# Patient Record
Sex: Male | Born: 1975 | Race: White | Hispanic: No | Smoking: Current every day smoker
Health system: Southern US, Community
[De-identification: ages and names within clinical notes are randomized; demographics above are authoritative.]

## PROBLEM LIST (undated history)

## (undated) ENCOUNTER — Emergency Department (HOSPITAL_COMMUNITY): Admission: EM | Payer: Worker's Compensation | Source: Home / Self Care

## (undated) DIAGNOSIS — D369 Benign neoplasm, unspecified site: Secondary | ICD-10-CM

## (undated) DIAGNOSIS — F319 Bipolar disorder, unspecified: Secondary | ICD-10-CM

## (undated) DIAGNOSIS — F1020 Alcohol dependence, uncomplicated: Secondary | ICD-10-CM

## (undated) DIAGNOSIS — F32A Depression, unspecified: Secondary | ICD-10-CM

## (undated) DIAGNOSIS — F329 Major depressive disorder, single episode, unspecified: Secondary | ICD-10-CM

## (undated) DIAGNOSIS — M549 Dorsalgia, unspecified: Secondary | ICD-10-CM

## (undated) HISTORY — PX: OTHER SURGICAL HISTORY: SHX169

---
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
## 2009-07-19 ENCOUNTER — Emergency Department (HOSPITAL_BASED_OUTPATIENT_CLINIC_OR_DEPARTMENT_OTHER): Admission: EM | Admit: 2009-07-19 | Discharge: 2009-07-19 | Payer: Self-pay | Admitting: Emergency Medicine

## 2009-11-19 ENCOUNTER — Emergency Department (HOSPITAL_BASED_OUTPATIENT_CLINIC_OR_DEPARTMENT_OTHER): Admission: EM | Admit: 2009-11-19 | Discharge: 2009-11-19 | Payer: Self-pay | Admitting: Emergency Medicine

## 2009-12-21 ENCOUNTER — Ambulatory Visit: Payer: Self-pay | Admitting: Diagnostic Radiology

## 2009-12-21 ENCOUNTER — Ambulatory Visit: Payer: Self-pay | Admitting: Internal Medicine

## 2009-12-21 ENCOUNTER — Ambulatory Visit (HOSPITAL_BASED_OUTPATIENT_CLINIC_OR_DEPARTMENT_OTHER): Admission: RE | Admit: 2009-12-21 | Discharge: 2009-12-21 | Payer: Self-pay | Admitting: Internal Medicine

## 2009-12-21 DIAGNOSIS — K219 Gastro-esophageal reflux disease without esophagitis: Secondary | ICD-10-CM | POA: Insufficient documentation

## 2009-12-21 DIAGNOSIS — M545 Low back pain, unspecified: Secondary | ICD-10-CM | POA: Insufficient documentation

## 2009-12-21 DIAGNOSIS — Z9189 Other specified personal risk factors, not elsewhere classified: Secondary | ICD-10-CM | POA: Insufficient documentation

## 2009-12-21 DIAGNOSIS — F39 Unspecified mood [affective] disorder: Secondary | ICD-10-CM | POA: Insufficient documentation

## 2009-12-21 LAB — CONVERTED CEMR LAB
ALT: 26 units/L (ref 0–53)
AST: 20 units/L (ref 0–37)
Albumin: 4.4 g/dL (ref 3.5–5.2)
Alkaline Phosphatase: 49 units/L (ref 39–117)
BUN: 8 mg/dL (ref 6–23)
Basophils Absolute: 0 10*3/uL (ref 0.0–0.1)
Basophils Relative: 0 % (ref 0–1)
Bilirubin, Direct: 0.1 mg/dL (ref 0.0–0.3)
CO2: 25 meq/L (ref 19–32)
Calcium: 9.9 mg/dL (ref 8.4–10.5)
Chloride: 103 meq/L (ref 96–112)
Creatinine, Ser: 0.79 mg/dL (ref 0.40–1.50)
Eosinophils Absolute: 0.3 10*3/uL (ref 0.0–0.7)
Eosinophils Relative: 4 % (ref 0–5)
Free T4: 1.14 ng/dL (ref 0.80–1.80)
Glucose, Bld: 92 mg/dL (ref 70–99)
HCT: 46 % (ref 39.0–52.0)
HCV Ab: NEGATIVE
Hemoglobin: 15.3 g/dL (ref 13.0–17.0)
Hep B S Ab: NEGATIVE
Indirect Bilirubin: 0.5 mg/dL (ref 0.0–0.9)
Lymphocytes Relative: 27 % (ref 12–46)
Lymphs Abs: 1.8 10*3/uL (ref 0.7–4.0)
MCHC: 33.3 g/dL (ref 30.0–36.0)
MCV: 91.5 fL (ref 78.0–100.0)
Monocytes Absolute: 0.6 10*3/uL (ref 0.1–1.0)
Monocytes Relative: 9 % (ref 3–12)
Neutro Abs: 4.1 10*3/uL (ref 1.7–7.7)
Neutrophils Relative %: 60 % (ref 43–77)
Platelets: 248 10*3/uL (ref 150–400)
Potassium: 4.3 meq/L (ref 3.5–5.3)
RBC: 5.03 M/uL (ref 4.22–5.81)
RDW: 12.7 % (ref 11.5–15.5)
Sodium: 140 meq/L (ref 135–145)
TSH: 0.773 microintl units/mL (ref 0.350–4.500)
Total Bilirubin: 0.6 mg/dL (ref 0.3–1.2)
Total Protein: 7.4 g/dL (ref 6.0–8.3)
WBC: 6.8 10*3/uL (ref 4.0–10.5)

## 2009-12-27 ENCOUNTER — Telehealth: Payer: Self-pay | Admitting: Internal Medicine

## 2010-01-18 ENCOUNTER — Ambulatory Visit: Payer: Self-pay | Admitting: Internal Medicine

## 2010-01-18 DIAGNOSIS — B353 Tinea pedis: Secondary | ICD-10-CM | POA: Insufficient documentation

## 2010-05-08 ENCOUNTER — Emergency Department (HOSPITAL_BASED_OUTPATIENT_CLINIC_OR_DEPARTMENT_OTHER): Admission: EM | Admit: 2010-05-08 | Discharge: 2010-05-08 | Payer: Self-pay | Admitting: Emergency Medicine

## 2010-05-08 ENCOUNTER — Ambulatory Visit: Payer: Self-pay | Admitting: Diagnostic Radiology

## 2010-07-15 ENCOUNTER — Ambulatory Visit: Payer: Self-pay | Admitting: Diagnostic Radiology

## 2010-07-15 ENCOUNTER — Emergency Department (HOSPITAL_BASED_OUTPATIENT_CLINIC_OR_DEPARTMENT_OTHER): Admission: EM | Admit: 2010-07-15 | Discharge: 2010-07-15 | Payer: Self-pay | Admitting: Emergency Medicine
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
## 2010-12-12 NOTE — Assessment & Plan Note (Signed)
Summary: 1 MONTH FOLLOW UP/MHF   Vital Signs:  Patient profile:   35 year old male Weight:      159 pounds BMI:     23.57 O2 Sat:      97 % on Room air Temp:     97.9 degrees F oral Pulse rate:   88 / minute Pulse rhythm:   regular Resp:     16 per minute BP sitting:   110 / 58  (right arm) Cuff size:   regular  Vitals Entered By: Glendell Docker CMA (January 18, 2010 8:29 AM)  O2 Flow:  Room air CC: Rm 3- 1 Month Follow up   Primary Care Provider:  Dondra Spry DO  CC:  Rm 3- 1 Month Follow up.  History of Present Illness: 35 y/o white male for f/u he is waiting for psych eval he describes recent mild manic episode  back pain - stable.  no exacerabation  c/o dry patch on lower leg  Allergies: 1)  ! 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   Lungs:  normal respiratory effort and normal breath sounds.   Heart:  normal rate, regular rhythm, and no gallop.   Skin:  small scaly patch right lower leg   Impression & Recommendations:  Problem # 1:  TINEA PEDIS (ICD-110.4)  Take medication as directed for full duration.   Problem # 2:  MOOD DISORDER (ICD-296.90) Assessment: Unchanged Awaiting psych eval.  Patient Instructions: 1)  Please schedule a follow-up appointment in 3 months. Prescriptions: CLOTRIMAZOLE-BETAMETHASONE 1-0.05 % CREA (CLOTRIMAZOLE-BETAMETHASONE) apply two times a day x 10 days  #30 grams x 1   Entered and Authorized by:   D. Thomos Lemons DO   Signed by:   D. Thomos Lemons DO on 01/18/2010   Method used:   Electronically to        [facility] [address] (retail)       [address] [place], [place]  [address]       Ph: [phone] or [phone]       Fax: [phone]   RxID:   [id number]   Current Allergies (reviewed today): ! CODEINE

## 2010-12-12 NOTE — Progress Notes (Signed)
Summary: Pt. want lab results and Xray results  Phone Note Call from Patient   Caller: Patient Call For: D. Thomos Lemons DO Summary of Call: Pt. would like to know his blood work results and his Xray results. Call patient back at [phone] Initial call taken by: Michaelle Copas,  December 27, 2009 9:41 AM  Follow-up for Phone Call        x ray of lumbar spine - normal blood tests are normal.  we will mail copy to pt Follow-up by: D. Thomos Lemons DO,  December 27, 2009 1:27 PM  Additional Follow-up for Phone Call Additional follow up Details #1::        called nad informed pt. that xray and blood work is normal Additional Follow-up by: Michaelle Copas,  December 27, 2009 1:41 PM

## 2011-09-12 ENCOUNTER — Encounter: Payer: Self-pay | Admitting: *Deleted

## 2011-09-12 ENCOUNTER — Emergency Department (HOSPITAL_BASED_OUTPATIENT_CLINIC_OR_DEPARTMENT_OTHER)
Admission: EM | Admit: 2011-09-12 | Discharge: 2011-09-12 | Disposition: A | Discharge status: Home / Self Care | Payer: Worker's Compensation | Attending: Emergency Medicine | Admitting: Emergency Medicine

## 2011-09-12 DIAGNOSIS — M549 Dorsalgia, unspecified: Secondary | ICD-10-CM | POA: Insufficient documentation

## 2011-09-12 DIAGNOSIS — Y9289 Other specified places as the place of occurrence of the external cause: Secondary | ICD-10-CM | POA: Insufficient documentation

## 2011-09-12 DIAGNOSIS — IMO0002 Reserved for concepts with insufficient information to code with codable children: Secondary | ICD-10-CM | POA: Insufficient documentation

## 2011-09-12 DIAGNOSIS — S46919A Strain of unspecified muscle, fascia and tendon at shoulder and upper arm level, unspecified arm, initial encounter: Secondary | ICD-10-CM

## 2011-09-12 DIAGNOSIS — X500XXA Overexertion from strenuous movement or load, initial encounter: Secondary | ICD-10-CM | POA: Insufficient documentation

## 2011-09-12 HISTORY — DX: Dorsalgia, unspecified: M54.9

## 2011-09-12 MED ORDER — CYCLOBENZAPRINE HCL 10 MG PO TABS: 10.0000 mg | ORAL_TABLET | Freq: Three times a day (TID) | ORAL | Status: AC | PRN

## 2011-09-12 MED ORDER — HYDROCODONE-ACETAMINOPHEN 5-325 MG PO TABS: 1.0000 | ORAL_TABLET | ORAL | Status: AC | PRN

## 2011-09-12 MED ORDER — PREDNISONE 10 MG PO TABS: ORAL_TABLET | ORAL | Status: DC

## 2011-09-12 NOTE — ED Provider Notes (Signed)
History     CSN: [id number] Arrival date & time: 09/12/2011  7:16 AM   None     Chief Complaint  Patient presents with  . Back Pain    (Consider location/radiation/quality/duration/timing/severity/associated sxs/prior treatment) HPI Comments: The patient had noted pain in his left upper back and left scapular region while at work yesterday. He lifts heavy boxes at work.  He had started work today and the first box he lifted cause considerable pain in the left shoulder area. He therefore sought evaluation. He is also noted some weird feelings in the left leg. He has a prior history of back problems. He's had no prior back surgery.  Patient is a 35 y.o. male presenting with back pain.  Back Pain  This is a new problem. The current episode started yesterday. Episode frequency: He developed the pain yesterday while at work, lifting boxes, and it recurred this morning when he lifted a box. Progression since onset: His pain is recurrent. The pain is associated with lifting heavy objects. Pain location: The pain is located in the upper back on the left side in the region of the left scapula. The quality of the pain is described as aching. The pain does not radiate. The pain is at a severity of 6/10. The pain is moderate. Exacerbated by: Is worsened by lifting heavy objects. He has tried nothing for the symptoms.    Past Medical History  Diagnosis Date  . Back pain     History reviewed. No pertinent past surgical history.  History reviewed. No pertinent family history.  History  Substance Use Topics  . Smoking status: Current Everyday Smoker -- 0.5 packs/day  . Smokeless tobacco: Not on file  . Alcohol Use: Yes     occ      Review of Systems  Musculoskeletal: Positive for back pain.  All other systems reviewed and are negative.    Allergies  Codeine  Home Medications   Current Outpatient Rx  Name Route Sig Dispense Refill  . AMPHETAMINE-DEXTROAMPHETAMINE 30 MG PO TABS  Oral Take 30 mg by mouth 2 (two) times daily.      . CYCLOBENZAPRINE HCL 10 MG PO TABS Oral Take 1 tablet (10 mg total) by mouth 3 (three) times daily as needed for muscle spasms. 15 tablet 0  . HYDROCODONE-ACETAMINOPHEN 5-325 MG PO TABS Oral Take 1 tablet by mouth every 4 (four) hours as needed for pain. 20 tablet 0  . PREDNISONE 10 MG PO TABS  Take 3 tablets per day for two days, then 2 tablets per day for 2 days, then 1 tablet per day for 2 days. 12 tablet 0    BP 134/83  Pulse 97  Temp(Src) 98.3 F (36.8 C) (Oral)  Resp 20  SpO2 100%  Physical Exam  Constitutional: He is oriented to person, place, and time. He appears well-developed and well-nourished. No distress.  HENT:  Head: Atraumatic.  Right Ear: External ear normal.  Left Ear: External ear normal.  Eyes: Conjunctivae and EOM are normal. Pupils are equal, round, and reactive to light.  Neck: Normal range of motion. Neck supple.  Cardiovascular: Normal rate, regular rhythm and normal heart sounds.   Pulmonary/Chest: Effort normal and breath sounds normal.  Abdominal: There is no tenderness.  Musculoskeletal:       He localizes pain to the left upper back in the region of the left scapula. There is no palpable bony deformity or tenderness. He has a 2 cm lipoma overlying the region  of L1. This is nontender to touch.  Lymphadenopathy:    He has no cervical adenopathy.  Neurological: He is alert and oriented to person, place, and time. He has normal reflexes.       No sensory or motor deficits  Skin: Skin is warm and dry.  Psychiatric: He has a normal mood and affect. His behavior is normal.    ED Course  Procedures (including critical care time)     1. Muscle strain, shoulder region             Carleene Cooper III, MD 09/12/11 [phone]

## 2011-09-12 NOTE — ED Notes (Signed)
Left shoulder down left leg pain states " i hunched over and straightened back out and it locked" pt reports history of back shoulder and neck pain

## 2013-01-30 ENCOUNTER — Emergency Department (HOSPITAL_BASED_OUTPATIENT_CLINIC_OR_DEPARTMENT_OTHER)
Admission: EM | Admit: 2013-01-30 | Discharge: 2013-01-30 | Disposition: A | Discharge status: Home / Self Care | Payer: Self-pay | Attending: Emergency Medicine | Admitting: Emergency Medicine

## 2013-01-30 ENCOUNTER — Encounter (HOSPITAL_BASED_OUTPATIENT_CLINIC_OR_DEPARTMENT_OTHER): Payer: Self-pay | Admitting: *Deleted

## 2013-01-30 DIAGNOSIS — F17200 Nicotine dependence, unspecified, uncomplicated: Secondary | ICD-10-CM | POA: Insufficient documentation

## 2013-01-30 DIAGNOSIS — Z79899 Other long term (current) drug therapy: Secondary | ICD-10-CM | POA: Insufficient documentation

## 2013-01-30 DIAGNOSIS — R197 Diarrhea, unspecified: Secondary | ICD-10-CM | POA: Insufficient documentation

## 2013-01-30 DIAGNOSIS — K5289 Other specified noninfective gastroenteritis and colitis: Secondary | ICD-10-CM | POA: Insufficient documentation

## 2013-01-30 DIAGNOSIS — R509 Fever, unspecified: Secondary | ICD-10-CM | POA: Insufficient documentation

## 2013-01-30 DIAGNOSIS — K529 Noninfective gastroenteritis and colitis, unspecified: Secondary | ICD-10-CM

## 2013-01-30 MED ORDER — ONDANSETRON 4 MG PO TBDP: 4.0000 mg | ORAL_TABLET | Freq: Four times a day (QID) | ORAL | Status: DC | PRN

## 2013-01-30 MED ORDER — ONDANSETRON 4 MG PO TBDP
4.0000 mg | ORAL_TABLET | Freq: Once | ORAL | Status: AC
  Administered 2013-01-30: 4 mg via ORAL
  Filled 2013-01-30: qty 1

## 2013-01-30 NOTE — ED Notes (Signed)
Pt sts he awoke this afternoon with vomiting and fever of 103. Pt sts he took tylenol at 3:00pm.

## 2013-01-30 NOTE — ED Provider Notes (Signed)
History     CSN: [id number]  Arrival date & time 01/30/13  0003   First MD Initiated Contact with Patient 01/30/13 0056      Chief Complaint  Patient presents with  . Emesis    (Consider location/radiation/quality/duration/timing/severity/associated sxs/prior treatment) HPI Jonathan Lin is a 37 y.o. male comes in complaining of nausea vomiting, diarrhea and fever of 103 today. He says his stools have been loose and watery, no blood, no melena. She says his vomiting has been consistent, he is occasionally minimally keep fluids down, vomited nonbilious and nonbloody.  No abdominal pain, no chest pain, no shortness of breath.  Symptoms have been severe, no other alleviating or exacerbating factors no associated symptoms. Drinks beer socially. Smokes a half pack cigarettes daily since 37 years old.  Past Medical History  Diagnosis Date  . Back pain     History reviewed. No pertinent past surgical history.  No family history on file.  History  Substance Use Topics  . Smoking status: Current Every Day Smoker -- 0.50 packs/day  . Smokeless tobacco: Not on file  . Alcohol Use: Yes     Comment: occ      Review of Systems At least 10pt or greater review of systems completed and are negative except where specified in the HPI.  Allergies  Codeine  Home Medications   Current Outpatient Rx  Name  Route  Sig  Dispense  Refill  . amphetamine-dextroamphetamine (ADDERALL) 30 MG tablet   Oral   Take 30 mg by mouth 2 (two) times daily.           . predniSONE (DELTASONE) 10 MG tablet      Take 3 tablets per day for two days, then 2 tablets per day for 2 days, then 1 tablet per day for 2 days.   12 tablet   0     BP 120/82  Pulse 96  Temp(Src) 98.8 F (37.1 C) (Oral)  Resp 16  Ht 5\' 11"  (1.803 m)  Wt 140 lb (63.504 kg)  BMI 19.53 kg/m2  SpO2 99%  Physical Exam  Nursing notes reviewed.  Electronic medical record reviewed. VITAL SIGNS:   Filed Vitals:   01/30/13  0010  BP: 120/82  Pulse: 96  Temp: 98.8 F (37.1 C)  TempSrc: Oral  Resp: 16  Height: 5\' 11"  (1.803 m)  Weight: 140 lb (63.504 kg)  SpO2: 99%   CONSTITUTIONAL: Awake, oriented, appears non-toxic HENT: Atraumatic, normocephalic, oral mucosa pink and moist, airway patent. Nares patent without drainage. External ears normal. EYES: Conjunctiva clear, EOMI, PERRLA NECK: Trachea midline, non-tender, supple CARDIOVASCULAR: Normal heart rate, Normal rhythm, No murmurs, rubs, gallops PULMONARY/CHEST: Clear to auscultation, no rhonchi, wheezes, or rales. Symmetrical breath sounds. Non-tender. ABDOMINAL: Non-distended, soft, non-tender - no rebound or guarding.  BS normal. NEUROLOGIC: Non-focal, moving all four extremities, no gross sensory or motor deficits. EXTREMITIES: No clubbing, cyanosis, or edema SKIN: Warm, Dry, No erythema, No rash  ED Course  Procedures (including critical care time)  Labs Reviewed - No data to display No results found.   1. Acute gastroenteritis       MDM  Jonathan Lin is a 37 y.o. male assessment likely viral gastroenteritis. Patient has nonfocal abdominal exam, doubt appendicitis, doubt biliary disease, pancreatitis, or any other intra-abdominal emergency; patient is afebrile and nontoxic in the emergency department, appears well-hydrated. Do not think this patient requires further workup for an IVP. History the patient is a Zofran he feels much better. The  patient to continue his current therapy of Tylenol, Motrin MDs Zofran as needed for nausea, continue fluid hydration by mouth.        Jones Skene, MD 01/30/13 [phone]

## 2013-01-30 NOTE — ED Notes (Signed)
Vomiting since 1245 yesterday.  Vomited " a lot".  Nausea continues. Fever to 103.  Took Tylenol at 1500 with minimal relief.  No diarrhea.  No pain. Took a cold shower and felt somewhat better.  No known exposure to illness.

## 2013-08-06 ENCOUNTER — Encounter (HOSPITAL_BASED_OUTPATIENT_CLINIC_OR_DEPARTMENT_OTHER): Payer: Self-pay | Admitting: Emergency Medicine

## 2013-08-06 ENCOUNTER — Emergency Department (HOSPITAL_BASED_OUTPATIENT_CLINIC_OR_DEPARTMENT_OTHER): Payer: Worker's Compensation

## 2013-08-06 ENCOUNTER — Emergency Department (HOSPITAL_BASED_OUTPATIENT_CLINIC_OR_DEPARTMENT_OTHER)
Admission: EM | Admit: 2013-08-06 | Discharge: 2013-08-06 | Disposition: A | Discharge status: Home / Self Care | Payer: Worker's Compensation | Attending: Emergency Medicine | Admitting: Emergency Medicine

## 2013-08-06 DIAGNOSIS — T07XXXA Unspecified multiple injuries, initial encounter: Secondary | ICD-10-CM

## 2013-08-06 DIAGNOSIS — F17200 Nicotine dependence, unspecified, uncomplicated: Secondary | ICD-10-CM | POA: Insufficient documentation

## 2013-08-06 DIAGNOSIS — S20229A Contusion of unspecified back wall of thorax, initial encounter: Secondary | ICD-10-CM | POA: Insufficient documentation

## 2013-08-06 DIAGNOSIS — S0990XA Unspecified injury of head, initial encounter: Secondary | ICD-10-CM | POA: Insufficient documentation

## 2013-08-06 DIAGNOSIS — S8000XA Contusion of unspecified knee, initial encounter: Secondary | ICD-10-CM | POA: Insufficient documentation

## 2013-08-06 DIAGNOSIS — S5000XA Contusion of unspecified elbow, initial encounter: Secondary | ICD-10-CM | POA: Insufficient documentation

## 2013-08-06 MED ORDER — OXYCODONE-ACETAMINOPHEN 5-325 MG PO TABS
2.0000 | ORAL_TABLET | Freq: Once | ORAL | Status: AC
  Administered 2013-08-06: 2 via ORAL
  Filled 2013-08-06: qty 2

## 2013-08-06 MED ORDER — OXYCODONE-ACETAMINOPHEN 5-325 MG PO TABS: 2.0000 | ORAL_TABLET | Freq: Four times a day (QID) | ORAL | Status: DC | PRN

## 2013-08-06 NOTE — ED Notes (Signed)
Pt. Reports he needs a few crackers for nausea.  Crackers given to Pt.
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
## 2013-08-09 ENCOUNTER — Emergency Department (HOSPITAL_BASED_OUTPATIENT_CLINIC_OR_DEPARTMENT_OTHER): Payer: Self-pay

## 2013-08-09 ENCOUNTER — Emergency Department (HOSPITAL_BASED_OUTPATIENT_CLINIC_OR_DEPARTMENT_OTHER): Payer: Worker's Compensation

## 2013-08-09 ENCOUNTER — Emergency Department (HOSPITAL_BASED_OUTPATIENT_CLINIC_OR_DEPARTMENT_OTHER)
Admission: EM | Admit: 2013-08-09 | Discharge: 2013-08-09 | Disposition: A | Discharge status: Home / Self Care | Payer: Worker's Compensation | Attending: Emergency Medicine | Admitting: Emergency Medicine

## 2013-08-09 ENCOUNTER — Encounter (HOSPITAL_BASED_OUTPATIENT_CLINIC_OR_DEPARTMENT_OTHER): Payer: Self-pay | Admitting: *Deleted

## 2013-08-09 DIAGNOSIS — K5900 Constipation, unspecified: Secondary | ICD-10-CM

## 2013-08-09 DIAGNOSIS — T148XXA Other injury of unspecified body region, initial encounter: Secondary | ICD-10-CM

## 2013-08-09 DIAGNOSIS — S301XXA Contusion of abdominal wall, initial encounter: Secondary | ICD-10-CM | POA: Insufficient documentation

## 2013-08-09 DIAGNOSIS — S20229A Contusion of unspecified back wall of thorax, initial encounter: Secondary | ICD-10-CM | POA: Insufficient documentation

## 2013-08-09 DIAGNOSIS — F411 Generalized anxiety disorder: Secondary | ICD-10-CM | POA: Insufficient documentation

## 2013-08-09 DIAGNOSIS — F17200 Nicotine dependence, unspecified, uncomplicated: Secondary | ICD-10-CM | POA: Insufficient documentation

## 2013-08-09 LAB — CBC WITH DIFFERENTIAL/PLATELET
Basophils Absolute: 0 10*3/uL (ref 0.0–0.1)
Basophils Relative: 1 % (ref 0–1)
Eosinophils Absolute: 0.3 10*3/uL (ref 0.0–0.7)
Eosinophils Relative: 5 % (ref 0–5)
HCT: 44.3 % (ref 39.0–52.0)
Hemoglobin: 15 g/dL (ref 13.0–17.0)
Lymphocytes Relative: 31 % (ref 12–46)
Lymphs Abs: 1.9 10*3/uL (ref 0.7–4.0)
MCH: 30.5 pg (ref 26.0–34.0)
MCHC: 33.9 g/dL (ref 30.0–36.0)
MCV: 90 fL (ref 78.0–100.0)
Monocytes Absolute: 0.6 10*3/uL (ref 0.1–1.0)
Monocytes Relative: 10 % (ref 3–12)
Neutro Abs: 3.3 10*3/uL (ref 1.7–7.7)
Neutrophils Relative %: 53 % (ref 43–77)
Platelets: 172 10*3/uL (ref 150–400)
RBC: 4.92 MIL/uL (ref 4.22–5.81)
RDW: 12.6 % (ref 11.5–15.5)
WBC: 6.3 10*3/uL (ref 4.0–10.5)

## 2013-08-09 LAB — COMPREHENSIVE METABOLIC PANEL
ALT: 28 U/L (ref 0–53)
AST: 17 U/L (ref 0–37)
Albumin: 3.9 g/dL (ref 3.5–5.2)
Alkaline Phosphatase: 59 U/L (ref 39–117)
BUN: 14 mg/dL (ref 6–23)
CO2: 28 mEq/L (ref 19–32)
Calcium: 10.1 mg/dL (ref 8.4–10.5)
Chloride: 102 mEq/L (ref 96–112)
Creatinine, Ser: 0.7 mg/dL (ref 0.50–1.35)
GFR calc Af Amer: >90 mL/min (ref >90)
GFR calc non Af Amer: >90 mL/min (ref >90)
Glucose, Bld: 111 mg/dL — ABNORMAL HIGH (ref 70–99)
Potassium: 3.9 mEq/L (ref 3.5–5.1)
Sodium: 139 mEq/L (ref 135–145)
Total Bilirubin: 0.3 mg/dL (ref 0.3–1.2)
Total Protein: 7.6 g/dL (ref 6.0–8.3)

## 2013-08-09 LAB — LIPASE, BLOOD: Lipase: 32 U/L (ref 11–59)

## 2013-08-09 MED ORDER — IOHEXOL 300 MG/ML  SOLN
100.0000 mL | Freq: Once | INTRAMUSCULAR | Status: AC | PRN
  Administered 2013-08-09: 100 mL via INTRAVENOUS

## 2013-08-09 MED ORDER — DOCUSATE SODIUM 100 MG PO CAPS: 100.0000 mg | ORAL_CAPSULE | Freq: Two times a day (BID) | ORAL | Status: DC

## 2013-08-09 MED ORDER — MORPHINE SULFATE 4 MG/ML IJ SOLN
4.0000 mg | Freq: Once | INTRAMUSCULAR | Status: AC
  Administered 2013-08-09: 4 mg via INTRAVENOUS
  Filled 2013-08-09: qty 1
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
## 2013-08-09 NOTE — ED Notes (Addendum)
Constipated. Believes "due to the nature of his injury" may have caused some internal damage. States that he took miralax once without result.

## 2013-10-29 ENCOUNTER — Encounter (HOSPITAL_COMMUNITY): Payer: Self-pay | Admitting: Behavioral Health

## 2013-10-29 ENCOUNTER — Encounter (HOSPITAL_COMMUNITY): Payer: Self-pay | Admitting: Emergency Medicine

## 2013-10-29 ENCOUNTER — Inpatient Hospital Stay (HOSPITAL_COMMUNITY)
Admission: AD | Admit: 2013-10-29 | Discharge: 2013-11-03 | DRG: 897 | Disposition: A | Discharge status: Home / Self Care | Payer: Federal, State, Local not specified - Other | Source: Intra-hospital | Attending: Psychiatry | Admitting: Psychiatry

## 2013-10-29 ENCOUNTER — Emergency Department (HOSPITAL_COMMUNITY)
Admission: EM | Admit: 2013-10-29 | Discharge: 2013-10-29 | Disposition: A | Discharge status: Other Acute Inpatient Hospital | Payer: Federal, State, Local not specified - Other | Attending: Emergency Medicine | Admitting: Emergency Medicine

## 2013-10-29 DIAGNOSIS — Z5989 Other problems related to housing and economic circumstances: Secondary | ICD-10-CM

## 2013-10-29 DIAGNOSIS — F32A Depression, unspecified: Secondary | ICD-10-CM

## 2013-10-29 DIAGNOSIS — F909 Attention-deficit hyperactivity disorder, unspecified type: Secondary | ICD-10-CM | POA: Diagnosis present

## 2013-10-29 DIAGNOSIS — F17200 Nicotine dependence, unspecified, uncomplicated: Secondary | ICD-10-CM | POA: Diagnosis present

## 2013-10-29 DIAGNOSIS — F39 Unspecified mood [affective] disorder: Secondary | ICD-10-CM | POA: Diagnosis present

## 2013-10-29 DIAGNOSIS — F332 Major depressive disorder, recurrent severe without psychotic features: Secondary | ICD-10-CM | POA: Diagnosis present

## 2013-10-29 DIAGNOSIS — R45851 Suicidal ideations: Secondary | ICD-10-CM

## 2013-10-29 DIAGNOSIS — F329 Major depressive disorder, single episode, unspecified: Secondary | ICD-10-CM | POA: Diagnosis present

## 2013-10-29 DIAGNOSIS — M545 Low back pain, unspecified: Secondary | ICD-10-CM

## 2013-10-29 DIAGNOSIS — F902 Attention-deficit hyperactivity disorder, combined type: Secondary | ICD-10-CM

## 2013-10-29 DIAGNOSIS — F419 Anxiety disorder, unspecified: Secondary | ICD-10-CM

## 2013-10-29 DIAGNOSIS — F1910 Other psychoactive substance abuse, uncomplicated: Secondary | ICD-10-CM | POA: Diagnosis present

## 2013-10-29 DIAGNOSIS — Z23 Encounter for immunization: Secondary | ICD-10-CM

## 2013-10-29 DIAGNOSIS — Z87820 Personal history of traumatic brain injury: Secondary | ICD-10-CM

## 2013-10-29 DIAGNOSIS — Z5987 Material hardship due to limited financial resources, not elsewhere classified: Secondary | ICD-10-CM

## 2013-10-29 DIAGNOSIS — F410 Panic disorder [episodic paroxysmal anxiety] without agoraphobia: Secondary | ICD-10-CM | POA: Diagnosis present

## 2013-10-29 DIAGNOSIS — G4700 Insomnia, unspecified: Secondary | ICD-10-CM | POA: Diagnosis present

## 2013-10-29 DIAGNOSIS — Z598 Other problems related to housing and economic circumstances: Secondary | ICD-10-CM

## 2013-10-29 DIAGNOSIS — F411 Generalized anxiety disorder: Secondary | ICD-10-CM | POA: Diagnosis present

## 2013-10-29 DIAGNOSIS — F10939 Alcohol use, unspecified with withdrawal, unspecified: Principal | ICD-10-CM | POA: Diagnosis present

## 2013-10-29 DIAGNOSIS — K219 Gastro-esophageal reflux disease without esophagitis: Secondary | ICD-10-CM

## 2013-10-29 DIAGNOSIS — F1020 Alcohol dependence, uncomplicated: Secondary | ICD-10-CM | POA: Diagnosis present

## 2013-10-29 DIAGNOSIS — F10239 Alcohol dependence with withdrawal, unspecified: Principal | ICD-10-CM | POA: Diagnosis present

## 2013-10-29 DIAGNOSIS — F1994 Other psychoactive substance use, unspecified with psychoactive substance-induced mood disorder: Secondary | ICD-10-CM | POA: Diagnosis present

## 2013-10-29 DIAGNOSIS — F10229 Alcohol dependence with intoxication, unspecified: Secondary | ICD-10-CM | POA: Insufficient documentation

## 2013-10-29 DIAGNOSIS — B353 Tinea pedis: Secondary | ICD-10-CM

## 2013-10-29 DIAGNOSIS — Z9189 Other specified personal risk factors, not elsewhere classified: Secondary | ICD-10-CM

## 2013-10-29 HISTORY — DX: Depression, unspecified: F32.A

## 2013-10-29 HISTORY — DX: Major depressive disorder, single episode, unspecified: F32.9

## 2013-10-29 HISTORY — DX: Alcohol dependence, uncomplicated: F10.20

## 2013-10-29 LAB — CBC
HCT: 42.3 % (ref 39.0–52.0)
Hemoglobin: 14.9 g/dL (ref 13.0–17.0)
MCH: 31.2 pg (ref 26.0–34.0)
MCHC: 35.2 g/dL (ref 30.0–36.0)
MCV: 88.5 fL (ref 78.0–100.0)
Platelets: 203 10*3/uL (ref 150–400)
RBC: 4.78 MIL/uL (ref 4.22–5.81)
RDW: 12.8 % (ref 11.5–15.5)
WBC: 7.3 10*3/uL (ref 4.0–10.5)

## 2013-10-29 LAB — COMPREHENSIVE METABOLIC PANEL
ALT: 24 U/L (ref 0–53)
AST: 21 U/L (ref 0–37)
Albumin: 4.4 g/dL (ref 3.5–5.2)
Alkaline Phosphatase: 75 U/L (ref 39–117)
BUN: 15 mg/dL (ref 6–23)
CO2: 23 mEq/L (ref 19–32)
Calcium: 9.6 mg/dL (ref 8.4–10.5)
Chloride: 98 mEq/L (ref 96–112)
Creatinine, Ser: 0.69 mg/dL (ref 0.50–1.35)
GFR calc Af Amer: >90 mL/min (ref >90)
GFR calc non Af Amer: >90 mL/min (ref >90)
Glucose, Bld: 104 mg/dL — ABNORMAL HIGH (ref 70–99)
Potassium: 3.5 mEq/L (ref 3.5–5.1)
Sodium: 135 mEq/L (ref 135–145)
Total Bilirubin: 0.3 mg/dL (ref 0.3–1.2)
Total Protein: 7.9 g/dL (ref 6.0–8.3)

## 2013-10-29 LAB — ETHANOL: Alcohol, Ethyl (B): <11 mg/dL (ref 0–11)

## 2013-10-29 LAB — RAPID URINE DRUG SCREEN, HOSP PERFORMED
Amphetamines: NOT DETECTED
Barbiturates: NOT DETECTED
Benzodiazepines: NOT DETECTED
Cocaine: NOT DETECTED
Opiates: NOT DETECTED
Tetrahydrocannabinol: NOT DETECTED

## 2013-10-29 MED ORDER — HYDROXYZINE HCL 25 MG PO TABS: 25.0000 mg | ORAL_TABLET | Freq: Four times a day (QID) | ORAL | Status: DC | PRN

## 2013-10-29 MED ORDER — CHLORDIAZEPOXIDE HCL 25 MG PO CAPS: 25.0000 mg | ORAL_CAPSULE | Freq: Once | ORAL | Status: DC

## 2013-10-29 MED ORDER — MAGNESIUM HYDROXIDE 400 MG/5ML PO SUSP: 30.0000 mL | Freq: Every day | ORAL | Status: DC | PRN

## 2013-10-29 MED ORDER — CHLORDIAZEPOXIDE HCL 25 MG PO CAPS
25.0000 mg | ORAL_CAPSULE | Freq: Four times a day (QID) | ORAL | Status: AC
  Administered 2013-10-29 (×2): 25 mg via ORAL
  Filled 2013-10-29 (×2): qty 1

## 2013-10-29 MED ORDER — INFLUENZA VAC SPLIT QUAD 0.5 ML IM SUSP
0.5000 mL | INTRAMUSCULAR | Status: AC
  Administered 2013-11-02: 0.5 mL via INTRAMUSCULAR
  Filled 2013-10-29: qty 0.5

## 2013-10-29 MED ORDER — VITAMIN B-1 100 MG PO TABS
100.0000 mg | ORAL_TABLET | Freq: Every day | ORAL | Status: DC
  Administered 2013-10-29 – 2013-11-03 (×6): 100 mg via ORAL
  Filled 2013-10-29 (×8): qty 1

## 2013-10-29 MED ORDER — ACETAMINOPHEN 325 MG PO TABS
ORAL_TABLET | ORAL | Status: AC
  Filled 2013-10-29: qty 2

## 2013-10-29 MED ORDER — HYDROXYZINE HCL 25 MG PO TABS
ORAL_TABLET | ORAL | Status: AC
  Filled 2013-10-29: qty 1

## 2013-10-29 MED ORDER — VITAMIN B-1 100 MG PO TABS
100.0000 mg | ORAL_TABLET | Freq: Every day | ORAL | Status: DC
  Administered 2013-10-29: 100 mg via ORAL
  Filled 2013-10-29: qty 1

## 2013-10-29 MED ORDER — HYDROXYZINE HCL 25 MG PO TABS
25.0000 mg | ORAL_TABLET | Freq: Four times a day (QID) | ORAL | Status: DC | PRN
  Administered 2013-10-29: 25 mg via ORAL

## 2013-10-29 MED ORDER — ACETAMINOPHEN 325 MG PO TABS: 650.0000 mg | ORAL_TABLET | Freq: Four times a day (QID) | ORAL | Status: DC | PRN

## 2013-10-29 MED ORDER — CHLORDIAZEPOXIDE HCL 25 MG PO CAPS
ORAL_CAPSULE | ORAL | Status: AC
  Administered 2013-10-29: 25 mg
  Filled 2013-10-29: qty 1

## 2013-10-29 MED ORDER — LORAZEPAM 1 MG PO TABS
0.0000 mg | ORAL_TABLET | Freq: Four times a day (QID) | ORAL | Status: DC
  Administered 2013-10-29: 1 mg via ORAL
  Filled 2013-10-29: qty 1

## 2013-10-29 MED ORDER — ADULT MULTIVITAMIN W/MINERALS CH
1.0000 | ORAL_TABLET | Freq: Every day | ORAL | Status: DC
  Administered 2013-10-29 – 2013-11-03 (×6): 1 via ORAL
  Filled 2013-10-29 (×8): qty 1

## 2013-10-29 MED ORDER — LOPERAMIDE HCL 2 MG PO CAPS: 2.0000 mg | ORAL_CAPSULE | ORAL | Status: DC | PRN

## 2013-10-29 MED ORDER — CHLORDIAZEPOXIDE HCL 25 MG PO CAPS: 25.0000 mg | ORAL_CAPSULE | Freq: Every day | ORAL | Status: DC

## 2013-10-29 MED ORDER — CHLORDIAZEPOXIDE HCL 25 MG PO CAPS: 25.0000 mg | ORAL_CAPSULE | Freq: Four times a day (QID) | ORAL | Status: DC | PRN

## 2013-10-29 MED ORDER — CHLORDIAZEPOXIDE HCL 25 MG PO CAPS
25.0000 mg | ORAL_CAPSULE | Freq: Once | ORAL | Status: DC
  Administered 2013-10-29: 25 mg via ORAL
  Filled 2013-10-29: qty 1

## 2013-10-29 MED ORDER — ADULT MULTIVITAMIN W/MINERALS CH
1.0000 | ORAL_TABLET | Freq: Every day | ORAL | Status: DC
  Administered 2013-10-29: 1 via ORAL
  Filled 2013-10-29: qty 1

## 2013-10-29 MED ORDER — CHLORDIAZEPOXIDE HCL 25 MG PO CAPS
25.0000 mg | ORAL_CAPSULE | Freq: Four times a day (QID) | ORAL | Status: DC
  Administered 2013-10-29: 25 mg via ORAL
  Filled 2013-10-29: qty 1

## 2013-10-29 MED ORDER — THIAMINE HCL 100 MG/ML IJ SOLN: 100.0000 mg | Freq: Every day | INTRAMUSCULAR | Status: DC

## 2013-10-29 MED ORDER — CHLORDIAZEPOXIDE HCL 25 MG PO CAPS: 25.0000 mg | ORAL_CAPSULE | ORAL | Status: DC

## 2013-10-29 MED ORDER — LOPERAMIDE HCL 2 MG PO CAPS: 2.0000 mg | ORAL_CAPSULE | ORAL | Status: AC | PRN

## 2013-10-29 MED ORDER — CHLORDIAZEPOXIDE HCL 25 MG PO CAPS
25.0000 mg | ORAL_CAPSULE | ORAL | Status: AC
  Administered 2013-10-31 (×2): 25 mg via ORAL
  Filled 2013-10-29 (×2): qty 1

## 2013-10-29 MED ORDER — ACETAMINOPHEN 325 MG PO TABS
650.0000 mg | ORAL_TABLET | Freq: Four times a day (QID) | ORAL | Status: DC | PRN
  Administered 2013-10-29: 650 mg via ORAL

## 2013-10-29 MED ORDER — ONDANSETRON 4 MG PO TBDP: 4.0000 mg | ORAL_TABLET | Freq: Four times a day (QID) | ORAL | Status: AC | PRN

## 2013-10-29 MED ORDER — CHLORDIAZEPOXIDE HCL 25 MG PO CAPS: 25.0000 mg | ORAL_CAPSULE | Freq: Three times a day (TID) | ORAL | Status: DC

## 2013-10-29 MED ORDER — ALUM & MAG HYDROXIDE-SIMETH 200-200-20 MG/5ML PO SUSP: 30.0000 mL | ORAL | Status: DC | PRN

## 2013-10-29 MED ORDER — CHLORDIAZEPOXIDE HCL 25 MG PO CAPS
25.0000 mg | ORAL_CAPSULE | Freq: Every day | ORAL | Status: AC
  Administered 2013-10-31: 25 mg via ORAL

## 2013-10-29 MED ORDER — PNEUMOCOCCAL VAC POLYVALENT 25 MCG/0.5ML IJ INJ
0.5000 mL | INJECTION | INTRAMUSCULAR | Status: AC
  Administered 2013-11-02: 0.5 mL via INTRAMUSCULAR

## 2013-10-29 MED ORDER — ONDANSETRON 4 MG PO TBDP: 4.0000 mg | ORAL_TABLET | Freq: Four times a day (QID) | ORAL | Status: DC | PRN

## 2013-10-29 MED ORDER — ALUM & MAG HYDROXIDE-SIMETH 200-200-20 MG/5ML PO SUSP
30.0000 mL | ORAL | Status: DC | PRN
  Administered 2013-10-31: 30 mL via ORAL

## 2013-10-29 MED ORDER — CHLORDIAZEPOXIDE HCL 25 MG PO CAPS
25.0000 mg | ORAL_CAPSULE | Freq: Three times a day (TID) | ORAL | Status: AC
  Administered 2013-10-30 (×3): 25 mg via ORAL
  Filled 2013-10-29 (×4): qty 1

## 2013-10-29 MED ORDER — LORAZEPAM 1 MG PO TABS: 0.0000 mg | ORAL_TABLET | Freq: Two times a day (BID) | ORAL | Status: DC

## 2013-10-29 MED ORDER — HYDROXYZINE HCL 25 MG PO TABS
25.0000 mg | ORAL_TABLET | Freq: Four times a day (QID) | ORAL | Status: DC | PRN
  Administered 2013-10-30 – 2013-11-02 (×9): 25 mg via ORAL
  Filled 2013-10-29 (×9): qty 1
  Filled 2013-10-29: qty 30
  Filled 2013-10-29: qty 1

## 2013-10-29 MED ORDER — MAGNESIUM HYDROXIDE 400 MG/5ML PO SUSP
30.0000 mL | Freq: Every day | ORAL | Status: DC | PRN
  Filled 2013-10-29: qty 30

## 2013-10-29 NOTE — ED Provider Notes (Signed)
CSN: [id number]     Arrival date & time 10/29/13  0440 History   First MD Initiated Contact with Patient 10/29/13 0535     Chief Complaint  Patient presents with  . Medical Clearance   (Consider location/radiation/quality/duration/timing/severity/associated sxs/prior Treatment) HPI 37yo alcoholic here voluntarily, wants detox also has suicidal ideation with plan to overdose, chronic anxiety and depression, no hallucinations, no withdrawal, no treatment PTA.  Past Medical History  Diagnosis Date  . Back pain   . Depression   . Alcoholism    Past Surgical History  Procedure Laterality Date  . Extraction of wisdom teeth    . Wisdom tooth extraction  1994   History reviewed. No pertinent family history. History  Substance Use Topics  . Smoking status: Current Every Day Smoker -- 0.50 packs/day    Types: Cigarettes  . Smokeless tobacco: Not on file  . Alcohol Use: Yes     Comment: 12pk/day plus liquor    Review of Systems 10 Systems reviewed and are negative for acute change except as noted in the HPI. Allergies  Codeine  Home Medications   No current outpatient prescriptions on file. BP 117/75  Pulse 74  Temp(Src) 97.8 F (36.6 C) (Oral)  Resp 18  SpO2 98% Physical Exam  Nursing note and vitals reviewed. Constitutional:  Awake, alert, nontoxic appearance.  HENT:  Head: Atraumatic.  Eyes: Right eye exhibits no discharge. Left eye exhibits no discharge.  Neck: Neck supple.  Cardiovascular: Normal rate and regular rhythm.   No murmur heard. Pulmonary/Chest: Effort normal and breath sounds normal. No respiratory distress. He has no wheezes. He has no rales. He exhibits no tenderness.  Abdominal: Soft. Bowel sounds are normal. He exhibits no distension. There is no tenderness. There is no rebound and no guarding.  Musculoskeletal: He exhibits no tenderness.  Baseline ROM, no obvious new focal weakness.  Neurological:  Mental status and motor strength appears  baseline for patient and situation.  Skin: No rash noted.  Psychiatric:  Anxious, depressed, suicidal ideation, no homicidal ideation, no hallucinations    ED Course  Procedures (including critical care time) Await TTS evaluation. Labs Review Labs Reviewed  COMPREHENSIVE METABOLIC PANEL - Abnormal; Notable for the following:    Glucose, Bld 104 (*)    All other components within normal limits  CBC  ETHANOL  URINE RAPID DRUG SCREEN (HOSP PERFORMED)   Imaging Review No results found.  EKG Interpretation   None       MDM   1. Suicidal ideation   2. Anxiety   3. Depression   4. Alcoholism   5. MDD (major depressive disorder)   6. Alcoholic    Dispo pending.    Hurman Horn, MD 10/30/13 2213

## 2013-10-29 NOTE — ED Notes (Signed)
Pt belongings consblack coat with hoodie, blue jeans, shown tshirt, white socks, brown crock shoesist of a

## 2013-10-29 NOTE — Progress Notes (Signed)
Recreation Therapy Notes  Date: 12.18.2014 Time: 3:00pm Location: [address] Dayroom   Group Topic: Communication, Team Building, Problem Solving  Goal Area(s) Addresses:  Patient will effectively work with peer towards shared goal.  Patient will identify skill used to make activity successful.  Patient will identify how skills used during activity can be used to reach post d/c goals.   Behavioral Response: Did not attend.   Marykay Lex Ellamae Lybeck, LRT/CTRS  Eron Staat L 10/29/2013 4:06 PM

## 2013-10-29 NOTE — Progress Notes (Signed)
[facility] did not get to see patient but will be sending information on the [facility] [employer], using that address provided.

## 2013-10-29 NOTE — Consult Note (Signed)
[facility] Face-to-Face Psychiatry Consult   Reason for Consult:  Alcohol dependence and suicidal ideation Referring Physician:  ER Jonathan Lin  Jonathan Lin is an 37 y.o. male.  Assessment: AXIS I:  Alcohol Abuse and Major Depression, Recurrent severe AXIS II:  Deferred AXIS III:   Past Medical History  Diagnosis Date  . Back pain   . Depression   . Alcoholism    AXIS IV:  says he is stressed out about life AXIS V:  41-50 serious symptoms  Plan:  Recommend psychiatric Inpatient admission when medically cleared.  Subjective:   Jonathan Lin is a 38 y.o. male patient admitted with alcohol dependence and suicidal ideation.  HPI:  Jonathan Lin has been drinking a pint or two of brandy daily.  He has a history of alcoholism.  He says he is stressed out about all aspects of his life without being specific about what particularly.  Says he has always been depressed, worse recently to the point of suicidal ideation and thoughts of stepping [place] front of [place]  He went to work yesterday and had tremors and an anxiety attack and was unable to drive himself home so he was brought here.  He does have a one year old child who is a positive part of his life.   Past Psychiatric History: Past Medical History  Diagnosis Date  . Back pain   . Depression   . Alcoholism     reports that he has been smoking.  He does not have any smokeless tobacco history on file. He reports that he drinks alcohol. He reports that he does not use illicit drugs. History reviewed. No pertinent family history.         Allergies:   Allergies  Allergen Reactions  . Codeine     Childhood-"made him feel funny"    ACT Assessment Complete:  Yes:    Educational Status    Risk to Self: Risk to self Is patient at risk for suicide?: Yes Substance abuse history and/or treatment for substance abuse?: Yes  Risk to Others:    Abuse:    Prior Inpatient Therapy:    Prior Outpatient Therapy:     Additional Information:                    Objective: Blood pressure 127/83, pulse 87, temperature 98.1 F (36.7 C), temperature source Oral, resp. rate 18, SpO2 98.00%.There is no weight on file to calculate BMI. Results for orders placed during the hospital encounter of 10/29/13 (from the past 72 hour(s))  URINE RAPID DRUG SCREEN (HOSP PERFORMED)     Status: None   Collection Time    10/29/13  5:15 AM      Result Value Range   Opiates NONE DETECTED  NONE DETECTED   Cocaine NONE DETECTED  NONE DETECTED   Benzodiazepines NONE DETECTED  NONE DETECTED   Amphetamines NONE DETECTED  NONE DETECTED   Tetrahydrocannabinol NONE DETECTED  NONE DETECTED   Barbiturates NONE DETECTED  NONE DETECTED   Comment:            DRUG SCREEN FOR MEDICAL PURPOSES     ONLY.  IF CONFIRMATION IS NEEDED     FOR ANY PURPOSE, NOTIFY LAB     WITHIN 5 DAYS.                LOWEST DETECTABLE LIMITS     FOR URINE DRUG SCREEN     Drug  Class       Cutoff (ng/mL)     Amphetamine      1000     Barbiturate      200     Benzodiazepine   200     Tricyclics       300     Opiates          300     Cocaine          300     THC              50  CBC     Status: None   Collection Time    10/29/13  5:27 AM      Result Value Range   WBC 7.3  4.0 - 10.5 K/uL   RBC 4.78  4.22 - 5.81 MIL/uL   Hemoglobin 14.9  13.0 - 17.0 g/dL   HCT 16.1  09.6 - 04.5 %   MCV 88.5  78.0 - 100.0 fL   MCH 31.2  26.0 - 34.0 pg   MCHC 35.2  30.0 - 36.0 g/dL   RDW 40.9  81.1 - 91.4 %   Platelets 203  150 - 400 K/uL  COMPREHENSIVE METABOLIC PANEL     Status: Abnormal   Collection Time    10/29/13  5:27 AM      Result Value Range   Sodium 135  135 - 145 mEq/L   Potassium 3.5  3.5 - 5.1 mEq/L   Chloride 98  96 - 112 mEq/L   CO2 23  19 - 32 mEq/L   Glucose, Bld 104 (*) 70 - 99 mg/dL   BUN 15  6 - 23 mg/dL   Creatinine, Ser 7.82  0.50 - 1.35 mg/dL   Calcium 9.6  8.4 - 95.6 mg/dL   Total Protein 7.9  6.0 - 8.3 g/dL    Albumin 4.4  3.5 - 5.2 g/dL   AST 21  0 - 37 U/L   ALT 24  0 - 53 U/L   Alkaline Phosphatase 75  39 - 117 U/L   Total Bilirubin 0.3  0.3 - 1.2 mg/dL   GFR calc non Af Amer >90  >90 mL/min   GFR calc Af Amer >90  >90 mL/min   Comment: (NOTE)     The eGFR has been calculated using the CKD EPI equation.     This calculation has not been validated in all clinical situations.     eGFR's persistently <90 mL/min signify possible Chronic Kidney     Disease.  ETHANOL     Status: None   Collection Time    10/29/13  5:27 AM      Result Value Range   Alcohol, Ethyl (B) <11  0 - 11 mg/dL   Comment:            LOWEST DETECTABLE LIMIT FOR     SERUM ALCOHOL IS 11 mg/dL     FOR MEDICAL PURPOSES ONLY   Labs are reviewed and are pertinent for no psychiatric issues.  No significant blood alcohol..  Current Facility-Administered Medications  Medication Dose Route Frequency Provider Last Rate Last Dose  . LORazepam (ATIVAN) tablet 0-4 mg  0-4 mg Oral Q6H Jonathan Horn, Jonathan Lin   1 mg at 10/29/13 0618   Followed by  . [START ON 10/31/2013] LORazepam (ATIVAN) tablet 0-4 mg  0-4 mg Oral Q12H Jonathan Horn, Jonathan Lin      . thiamine (VITAMIN B-1) tablet 100  mg  100 mg Oral Daily Jonathan Horn, Jonathan Lin       Or  . thiamine (B-1) injection 100 mg  100 mg Intravenous Daily Jonathan Horn, Jonathan Lin       Current Outpatient Prescriptions  Medication Sig Dispense Refill  . acetaminophen (TYLENOL) 500 MG tablet Take 500 mg by mouth every 6 (six) hours as needed.      . clonazePAM (KLONOPIN) 0.5 MG tablet Take 1 mg by mouth once.        Psychiatric Specialty Exam:     Blood pressure 127/83, pulse 87, temperature 98.1 F (36.7 C), temperature source Oral, resp. rate 18, SpO2 98.00%.There is no weight on file to calculate BMI.  General Appearance: Fairly Groomed  Patent attorney::  Good  Speech:  Clear and Coherent  Volume:  Normal  Mood:  Depressed  Affect:  Depressed  Thought Process:  Coherent, Goal Directed and Logical   Orientation:  Full (Time, Place, and Person)  Thought Content:  Negative  Suicidal Thoughts:  Yes.  with intent/plan  Homicidal Thoughts:  No  Memory:  Immediate;   Good Recent;   Good Remote;   Good  Judgement:  Intact  Insight:  Good  Psychomotor Activity:  Normal  Concentration:  Good  Recall:  Good  Akathisia:  Negative  Handed:  Right  AIMS (if indicated):     Assets:  Communication Skills Desire for Improvement Financial Resources/Insurance Housing Physical Health Social Support Vocational/Educational  Sleep:   adequate   Treatment Plan Summary: Daily contact with patient to assess and evaluate symptoms and progress in treatment Medication management Recommend inpatient psychiatric treatment for detox from alcohol and for depression at whatever inpatient bed can be found  Yuji Walth D 10/29/2013 9:21 AM

## 2013-10-29 NOTE — Progress Notes (Signed)
Patient did not attend the evening karaoke group. Pt was newly admitted and remained in bed during group.

## 2013-10-29 NOTE — Progress Notes (Signed)
Per, Dr. Ladona Ridgel the patient meets criteria for inpatient hospitalization to a [address] or [address] Bed.   Writer will refer the patient to other hospitals.

## 2013-10-29 NOTE — Tx Team (Signed)
Initial Interdisciplinary Treatment Plan  PATIENT STRENGTHS: (choose at least two) Average or above average intelligence Communication skills General fund of knowledge Motivation for treatment/growth Physical Health Special hobby/interest Supportive family/friends  PATIENT STRESSORS: Financial difficulties Health problems Occupational concerns Substance abuse   PROBLEM LIST: Problem List/Patient Goals Date to be addressed Date deferred Reason deferred Estimated date of resolution  Anxiety 10/29/2013   11/05/2013  Depression  10/29/2013   11/05/2013  Substance abuse/detox 10/29/2013   11/05/2013                                       DISCHARGE CRITERIA:  Ability to meet basic life and health needs Improved stabilization in mood, thinking, and/or behavior Motivation to continue treatment in a less acute level of care Need for constant or close observation no longer present Reduction of life-threatening or endangering symptoms to within safe limits Verbal commitment to aftercare and medication compliance Withdrawal symptoms are absent or subacute and managed without 24-hour nursing intervention  PRELIMINARY DISCHARGE PLAN: Attend 12-step recovery group Outpatient therapy Return to previous living arrangement Return to previous work or school arrangements  PATIENT/FAMIILY INVOLVEMENT: This treatment plan has been presented to and reviewed with the patient, Jonathan Lin, and/or family member.  The patient and family have been given the opportunity to ask questions and make suggestions.  Lenka Zhao Shari Prows 10/29/2013, 12:54 PM

## 2013-10-29 NOTE — ED Notes (Signed)
Pt states he has chronic depression and is an alcoholic  Pt states tonight during his "work shift" he started having tremors and got very anxious  Pt states he left work and tried to driver home but was in a state he was not able to drive so he went to his friends house and had him bring him here  Pt states he has been having all kinds of thoughts none of which he says are good  Pt states he is SI but denies HI  Cooperative in triage

## 2013-10-29 NOTE — Progress Notes (Signed)
Pt accepted by Dr. Ladona Ridgel to [facility] 307-2. Patient tobe transported voluntarily by [employer]. RN aware and will arrange transport.   Catha Gosselin, LCSW [phone]  ED CSW .10/29/2013 948am

## 2013-10-29 NOTE — Progress Notes (Signed)
Patient has been accepted to [facility] Bed 307-2.  Writer informed the ER MD and the nurse of the patients disposition.  CSW will complete support paperwork for the patient.

## 2013-10-29 NOTE — Progress Notes (Signed)
Patient ID: Patty Lopezgarcia, male   DOB: 09/13/1976, 37 y.o.   MRN: [id number] This is a 37 year old male admitted for ETOH detox. Pt had an anxiety attack at work and had to leave immediately. Pt states that he was shaking severely but does not know exactly why. Pt is drinking a 12 pk of beer/day + liquor, but not using drugs. Pt's longest period of sobriety was 4 months and he "has been an alcoholic since his teens." Pt denies SI/HI and AVH on admission but reports a SA by OD on heroine in the past. Pt does contract for safety. His CIWA is a 5. Pt mood is depressed/anxious and his affect is sad/flat. 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 Pt has poor oral hygiene and cavities in front upper teeth. Pt reports hx of seizures and DT's with detox in the past. Writer oriented pt to the milieu and discussed sobriety as a primary goal. 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 He has a 69 year old son with his girlfriend from high school. Writer provided food and 15 minute checks were initiated for safety.

## 2013-10-30 ENCOUNTER — Encounter (HOSPITAL_COMMUNITY): Payer: Self-pay | Admitting: Psychiatry

## 2013-10-30 DIAGNOSIS — F39 Unspecified mood [affective] disorder: Secondary | ICD-10-CM

## 2013-10-30 DIAGNOSIS — F411 Generalized anxiety disorder: Secondary | ICD-10-CM

## 2013-10-30 DIAGNOSIS — F322 Major depressive disorder, single episode, severe without psychotic features: Secondary | ICD-10-CM

## 2013-10-30 DIAGNOSIS — F1020 Alcohol dependence, uncomplicated: Secondary | ICD-10-CM

## 2013-10-30 DIAGNOSIS — F902 Attention-deficit hyperactivity disorder, combined type: Secondary | ICD-10-CM | POA: Diagnosis present

## 2013-10-30 DIAGNOSIS — F909 Attention-deficit hyperactivity disorder, unspecified type: Secondary | ICD-10-CM | POA: Diagnosis present

## 2013-10-30 MED ORDER — NICOTINE 21 MG/24HR TD PT24
21.0000 mg | MEDICATED_PATCH | Freq: Every day | TRANSDERMAL | Status: DC
  Administered 2013-10-30 – 2013-11-03 (×5): 21 mg via TRANSDERMAL
  Filled 2013-10-30 (×7): qty 1

## 2013-10-30 MED ORDER — CHLORDIAZEPOXIDE HCL 25 MG PO CAPS
25.0000 mg | ORAL_CAPSULE | Freq: Once | ORAL | Status: AC
  Administered 2013-10-30: 25 mg via ORAL
  Filled 2013-10-30: qty 1

## 2013-10-30 MED ORDER — CHLORDIAZEPOXIDE HCL 25 MG PO CAPS
25.0000 mg | ORAL_CAPSULE | Freq: Four times a day (QID) | ORAL | Status: DC | PRN
  Administered 2013-10-30 – 2013-11-02 (×5): 25 mg via ORAL
  Filled 2013-10-30 (×5): qty 1

## 2013-10-30 NOTE — [facility] Suicide Risk Assessment (Signed)
[facility] INPATIENT:  Family/Significant Other Suicide Prevention Education  Suicide Prevention Education:  Education Completed; Jonathan Lin - roommate [phone]),  (name of family member/significant other) has been identified by the patient as the family member/significant other with whom the patient will be residing, and identified as the person(s) who will aid the patient in the event of a mental health crisis (suicidal ideations/suicide attempt).  With written consent from the patient, the family member/significant other has been provided the following suicide prevention education, prior to the and/or following the discharge of the patient.  The suicide prevention education provided includes the following:  Suicide risk factors  Suicide prevention and interventions  National Suicide Hotline telephone number  [facility] assessment telephone number  [facility] Emergency Assistance 911  [facility] and/or Residential Mobile Crisis Unit telephone number  Request made of family/significant other to:  Remove weapons (e.g., guns, rifles, knives), all items previously/currently identified as safety concern.    Remove drugs/medications (over-the-counter, prescriptions, illicit drugs), all items previously/currently identified as a safety concern.  The family member/significant other verbalizes understanding of the suicide prevention education information provided.  The family member/significant other agrees to remove the items of safety concern listed above.  Jonathan Lin, Jonathan Lin 10/30/2013, 1:10 PM

## 2013-10-30 NOTE — [facility] Group Notes (Signed)
[facility] LCSW Group Therapy  10/30/2013 1:15 PM   Type of Therapy:  Group Therapy  Participation Level:  Did Not Attend - pt sleeping in his room  Reyes Ivan, LCSW 10/30/2013 2:39 PM

## 2013-10-30 NOTE — [facility] Group Notes (Signed)
[facility] LCSW Aftercare Discharge Planning Group Note   10/30/2013 8:45 AM  Participation Quality:  Alert, Appropriate and Oriented  Mood/Affect:  Anxious  Depression Rating:  5  Anxiety Rating:  5  Thoughts of Suicide:  Pt denies SI/HI  Will you contract for safety?   Yes  Current AVH:  Pt denies  Plan for Discharge/Comments:  Pt attended discharge planning group and actively participated in group.  CSW provided pt with today's workbook.  Pt reports coming to the hospital for anxiety and having a panic attack at work.  Pt states that he lives in [place] with a roommate and works at [employer].  Pt requesting CSW to contact his employer today; CSW will contact today with pt's consent.  Pt denies having any follow up; CSW will refer pt to [facility] for medication management and therapy.  No further needs voiced by pt at this time.    Transportation Means: Pt reports access to transportation  Supports: No supports mentioned at this time  Reyes Ivan, LCSW 10/30/2013 10:00 AM

## 2013-10-30 NOTE — Progress Notes (Signed)
Patient ID: Jonathan Lin, male   DOB: 05/01/1976, 37 y.o.   MRN: [id number] Pt did not attend 11 am Psychoeducational group. He was asleep.

## 2013-10-30 NOTE — [facility] Suicide Risk Assessment (Signed)
Suicide Risk Assessment  Admission Assessment     Nursing information obtained from:  Patient Demographic factors:  Male;Caucasian Current Mental Status:  Suicidal ideation indicated by patient;Self-harm thoughts Loss Factors:  Financial problems / change in socioeconomic status;Decline in physical health Historical Factors:  Prior suicide attempts;Family history of mental illness or substance abuse Risk Reduction Factors:  Responsible for children under 19 years of age;Sense of responsibility to family;Living with another person, especially a relative;Positive social support  CLINICAL FACTORS:   Depression:   Comorbid alcohol abuse/dependence Alcohol/Substance Abuse/Dependencies More than one psychiatric diagnosis  COGNITIVE FEATURES THAT CONTRIBUTE TO RISK:  Closed-mindedness Polarized thinking Thought constriction (tunnel vision)    SUICIDE RISK:   Moderate:  Frequent suicidal ideation with limited intensity, and duration, some specificity in terms of plans, no associated intent, good self-control, limited dysphoria/symptomatology, some risk factors present, and identifiable protective factors, including available and accessible social support.  PLAN OF CARE: Supportive approach/coping skills/relapse prevention                               [facility] detox protocol                               Reassess and address the co morbidities  I certify that inpatient services furnished can reasonably be expected to improve the patient's condition.  Reinhold Rickey A 10/30/2013, 12:55 PM

## 2013-10-30 NOTE — Progress Notes (Signed)
D: pt in dayroom attending group. Pt complains of mild anxiety and tremors. Denies SI/HI/AVH. Denies pain. Pt is calm and cooperative. Appropriate to situation. A: vistaril and librium given prn given to pt. Support and encouragement offered. q 15 min safety checks R: pt remains safe on unit. Pt stated decrease in anxiety after medication given

## 2013-10-30 NOTE — Progress Notes (Signed)
D   Pt is anxious and depressed  He reports mild to moderate signs and symptoms of withdrawal   He reports nervousness and tremors and increased anxiety   He did not go to group and has isolated to his room having no interaction with others  A   Verbal support given   Discussed detox symptoms and medications prescribed to relieve them   Encouraged pt to be out of his room when starting to feel better   Q 15 min checks R   Pt safe at present

## 2013-10-30 NOTE — Tx Team (Signed)
Interdisciplinary Treatment Plan Update (Adult)  Date: 10/30/2013  Time Reviewed:  9:45 AM  Progress in Treatment: Attending groups: Yes Participating in groups:  Yes Taking medication as prescribed:  Yes Tolerating medication:  Yes Family/Significant othe contact made: CSW assessing Patient understands diagnosis:  Yes Discussing patient identified problems/goals with staff:  Yes Medical problems stabilized or resolved:  Yes Denies suicidal/homicidal ideation: Yes Issues/concerns per patient self-inventory:  Yes Other:  New problem(s) identified: N/A  Discharge Plan or Barriers: CSW assessing for appropriate referrals.    Reason for Continuation of Hospitalization: Anxiety Depression Medication Stabilization  Comments: N/A  Estimated length of stay: 3-5 days  For review of initial/current patient goals, please see plan of care.  Attendees: Patient:     Family:     Physician:  Dr. Dub Mikes 10/30/2013 10:24 AM   Nursing:   Malva Limes, RN  10/30/2013 10:24 AM   Clinical Social Worker:  Reyes Ivan, LCSW 10/30/2013 10:24 AM   Other: Onnie Boer, RN case manager 10/30/2013 10:24 AM   Other:  Trula Slade, LCSWA 10/30/2013 10:24 AM   Other:  Serena Colonel, NP 10/30/2013 10:24 AM   Other:     Other:    Other:    Other:    Other:    Other:    Other:     Scribe for Treatment Team:   Carmina Miller, 10/30/2013 , 10:24 AM
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
## 2013-10-30 NOTE — Progress Notes (Signed)
Patient ID: Jonathan Lin, male   DOB: October 20, 1976, 37 y.o.   MRN: [id number] D. Patient presents with depressed, anxious mood, very disheveled appearance. Patient states '' detox is going rough '' Patient is very tremulous, and reports anxiety, shakiness, sweats. He also reports poor sleep, and rates depression at 5/10 on depression scale, 10 being worst depression 1 being least on scale. He continues to endorse vague fleeting SI, verbal contract for safety. A. Discussed above information with MD and patients persistent withdrawal symptoms. Orders received. Support and encouragement provided. Encouraged po fluids and gatorade given. Medications given as ordered. R. Patient has been cooperative, attending programming at this time. No further voiced concerns at this time. Will continue to monitor q 15 minutes. Pt is safe at this time.
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
## 2013-10-31 MED ORDER — VENLAFAXINE HCL ER 37.5 MG PO CP24
37.5000 mg | ORAL_CAPSULE | Freq: Every day | ORAL | Status: DC
  Administered 2013-11-01: 37.5 mg via ORAL
  Filled 2013-10-31 (×3): qty 1

## 2013-10-31 MED ORDER — TRAZODONE HCL 50 MG PO TABS
50.0000 mg | ORAL_TABLET | Freq: Every evening | ORAL | Status: DC | PRN
  Administered 2013-10-31 – 2013-11-02 (×4): 50 mg via ORAL
  Filled 2013-10-31: qty 1
  Filled 2013-10-31: qty 14
  Filled 2013-10-31 (×3): qty 1

## 2013-10-31 NOTE — Progress Notes (Signed)
[facility] MD Progress Note  10/31/2013 2:53 PM Jonathan Lin  MRN:  [id number] Subjective:  Patient was seen and chart reviewed. Patient has been compliant with the inpatient hospitalization program without any difficulties. Patient reported he has been drinking alcohol moderate to severe. Patient denies any negative consequences of drinking. Patient reported his anxiety is 3/10 and depression for about of 10 and continued to have suicidal ideation hopelessness and hopelessness. Patient stated that he was anxious about getting home after discharged. Patient cannot contract for safety at this time.  Diagnosis:   DSM5: Schizophrenia Disorders:   Obsessive-Compulsive Disorders:   Trauma-Stressor Disorders:   Substance/Addictive Disorders:  Alcohol Related Disorder - Severe (303.90) Depressive Disorders:  Major Depressive Disorder - Severe (296.23)  Axis I: ADHD, combined type, Major Depression, Recurrent severe, Substance Induced Mood Disorder and Alcohol dependence  ADL's:  Intact  Sleep: Fair  Appetite:  Fair  Suicidal Ideation:  Patient endorses suicidal ideation and contracts for safety in the hospital Homicidal Ideation:   AEB (as evidenced by):  Psychiatric Specialty Exam: ROS  Blood pressure 113/77, pulse 77, temperature 97.7 F (36.5 C), temperature source Oral, resp. rate 16, height 5\' 8"  (1.727 m), weight 78.472 kg (173 lb).Body mass index is 26.31 kg/(m^2).  General Appearance: Casual and Guarded  Eye Contact::  Fair  Speech:  Clear and Coherent  Volume:  Normal  Mood:  Anxious, Depressed, Hopeless and Worthless  Affect:  Constricted and Depressed  Thought Process:  Goal Directed and Intact  Orientation:  Full (Time, Place, and Person)  Thought Content:  Rumination  Suicidal Thoughts:  Yes.  without intent/plan  Homicidal Thoughts:  No  Memory:  Immediate;   Fair  Judgement:  Impaired  Insight:  Lacking  Psychomotor Activity:  Normal  Concentration:  Fair  Recall:   Fair  Akathisia:  NA  Handed:  Right  AIMS (if indicated):     Assets:  Communication Skills Desire for Improvement Physical Health Resilience Social Support  Sleep:  Number of Hours: 5   Current Medications: Current Facility-Administered Medications  Medication Dose Route Frequency Provider Last Rate Last Dose  . acetaminophen (TYLENOL) tablet 650 mg  650 mg Oral Q6H PRN Shuvon Rankin, NP   650 mg at 10/29/13 1338  . alum & mag hydroxide-simeth (MAALOX/MYLANTA) 200-200-20 MG/5ML suspension 30 mL  30 mL Oral Q4H PRN Shuvon Rankin, NP      . chlordiazePOXIDE (LIBRIUM) capsule 25 mg  25 mg Oral BH-qamhs Shuvon Rankin, NP   25 mg at 10/31/13 0814  . chlordiazePOXIDE (LIBRIUM) capsule 25 mg  25 mg Oral Q6H PRN Rachael Fee, MD   25 mg at 10/31/13 4098  . hydrOXYzine (ATARAX/VISTARIL) tablet 25 mg  25 mg Oral Q6H PRN Rachael Fee, MD   25 mg at 10/31/13 1127  . influenza vac split quadrivalent PF (FLUARIX) injection 0.5 mL  0.5 mL Intramuscular Tomorrow-1000 Rachael Fee, MD      . loperamide (IMODIUM) capsule 2-4 mg  2-4 mg Oral PRN Shuvon Rankin, NP      . magnesium hydroxide (MILK OF MAGNESIA) suspension 30 mL  30 mL Oral Daily PRN Shuvon Rankin, NP      . multivitamin with minerals tablet 1 tablet  1 tablet Oral Daily Shuvon Rankin, NP   1 tablet at 10/31/13 0814  . nicotine (NICODERM CQ - dosed in mg/24 hours) patch 21 mg  21 mg Transdermal Daily Rachael Fee, MD   21 mg at 10/31/13 1191  .  ondansetron (ZOFRAN-ODT) disintegrating tablet 4 mg  4 mg Oral Q6H PRN Shuvon Rankin, NP      . pneumococcal 23 valent vaccine (PNU-IMMUNE) injection 0.5 mL  0.5 mL Intramuscular Tomorrow-1000 Rachael Fee, MD      . thiamine (VITAMIN B-1) tablet 100 mg  100 mg Oral Daily Shuvon Rankin, NP   100 mg at 10/31/13 0815  . traZODone (DESYREL) tablet 50 mg  50 mg Oral QHS PRN Kristeen Mans, NP   50 mg at 10/31/13 0115    Lab Results: No results found for this or any previous visit (from the past 48  hour(s)).  Physical Findings: AIMS: Facial and Oral Movements Muscles of Facial Expression: None, normal Lips and Perioral Area: None, normal Jaw: None, normal Tongue: None, normal,Extremity Movements Upper (arms, wrists, hands, fingers): None, normal Lower (legs, knees, ankles, toes): None, normal, Trunk Movements Neck, shoulders, hips: None, normal, Overall Severity Severity of abnormal movements (highest score from questions above): None, normal Incapacitation due to abnormal movements: None, normal, Dental Status Current problems with teeth and/or dentures?: Yes (cavities: front upper) Does patient usually wear dentures?: No  CIWA:  CIWA-Ar Total: 3 COWS:     Treatment Plan Summary: Daily contact with patient to assess and evaluate symptoms and progress in treatment Medication management  Plan: Treatment Plan/Recommendations:   1. Admit for crisis management and stabilization. 2. Medication management to reduce current symptoms to base line and improve the patient's overall level of functioning. 3. Treat health problems as indicated. 4. Develop treatment plan to decrease risk of relapse upon discharge and to reduce the need for readmission. 5. Psycho-social education regarding relapse prevention and self care. 6. Health care follow up as needed for medical problems. 7. Restart home medications where appropriate.   Medical Decision Making Problem Points:  Established problem, worsening (2), Review of last therapy session (1) and Review of psycho-social stressors (1) Data Points:  Review or order clinical lab tests (1) Review or order medicine tests (1) Review of medication regiment & side effects (2) Review of new medications or change in dosage (2)  I certify that inpatient services furnished can reasonably be expected to improve the patient's condition.   Leontina Skidmore,JANARDHAHA R. 10/31/2013, 2:53 PM

## 2013-10-31 NOTE — Progress Notes (Addendum)
Pt states his energy level is a 9/10 today. 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 Pt is very upbeat this am and wants to be discharged. He did sign a 72 hour notice. Pt has good eye contact. He contracts for safety. Pt lives with his GF and is excited about seeing his 38 year old son. He as been attending groups. 5pm-pt given maalox for indigestion. He stated he is having a visitor this pm and he may feel a little nervous. Pt stated,"often I get two librium at one time." Instructed pt that he was not due for any librium.

## 2013-10-31 NOTE — [facility] Group Notes (Signed)
[facility] Group Notes:  (Clinical Social Work)  10/31/2013     10-11AM  Summary of Progress/Problems:   The main focus of today's process group was for the patient to identify ways in which they have in the past sabotaged their own recovery. Motivational Interviewing was utilized to ask the group members what they get out of their substance use, and what reasons they may have for wanting to change.  The Stages of Change were explained using a handout, and patients identified where they currently are with regard to stages of change.  The patient expressed that although he uses alcohol, he does not feel it is a problem because his anxiety and depression drive it.  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  He feels he is in the Preparation stage.  He particularly related to the description of dual diagnosis, and how each illness much be treated for optimal success.  Type of Therapy:  Group Therapy - Process   Participation Level:  Active  Participation Quality:  Attentive and Sharing  Affect:  Blunted  Cognitive:  Appropriate and Oriented  Insight:  Engaged  Engagement in Therapy:  Engaged  Modes of Intervention:  Education, Teacher, English as a foreign language, Motivational Interviewing  Ambrose Mantle, LCSW 10/31/2013, 1:02 PM

## 2013-10-31 NOTE — [facility] Group Notes (Signed)
[facility] Group Notes:  (Nursing/MHT/Case Management/Adjunct)  Date:  10/31/2013  Time:  4:05 PM  Type of Therapy:  Psychoeducational Skills  Participation Level:  Active  Participation Quality:  Appropriate  Affect:  Appropriate  Cognitive:  Alert  Insight:  Appropriate  Engagement in Group:  Engaged  Modes of Intervention:  Education  Summary of Progress/Problems:pt discussed with the group about his addicition problems.   Rodman Key [facility] 10/31/2013, 4:05 PM

## 2013-10-31 NOTE — [facility] Group Notes (Signed)
[facility] Group Notes:  (Nursing/MHT/Case Management/Adjunct)  Date:  10/31/2013  Time:  10:09 AM  Type of Therapy:  Psychoeducational Skills  Participation Level:  Active  Participation Quality:  Appropriate  Affect:  Appropriate  Cognitive:  Alert  Insight:  Appropriate  Engagement in Group:  Engaged  Modes of Intervention:  Education  Summary of Progress/Problems:Pt stated his energy level is high today. He thought he might try to join a meet up as he finds he withdraws from people especially this time of year.   Rodman Key [facility] 10/31/2013, 10:09 AM

## 2013-10-31 NOTE — Progress Notes (Signed)
Patient did not attend the evening speaker AA meeting. Pt was notified that meeting was starting but remained in bed.

## 2013-11-01 MED ORDER — VENLAFAXINE HCL ER 75 MG PO CP24
75.0000 mg | ORAL_CAPSULE | Freq: Every day | ORAL | Status: DC
  Administered 2013-11-02: 75 mg via ORAL
  Filled 2013-11-01 (×4): qty 1

## 2013-11-01 NOTE — [facility] Group Notes (Signed)
[facility] Group Notes:  (Clinical Social Work)  11/01/2013  10:00-11:00AM  Summary of Progress/Problems:   The main focus of today's process group was to   identify the patient's current support system and decide on other supports that can be put in place.    An emphasis was placed on using counselor, doctor, therapy groups, 12-step groups, and problem-specific support groups to expand supports.   There was also an extensive discussion about what constitutes a healthy support versus an unhealthy support.  The patient expressed full comprehension of the concepts presented, and agreed that there is a need to add more supports, citing as an example the time period where he struggled with heavy drug and alcohol addiction in the past.  He was supportive of other patients for the first part of group, very interactive with all, then suddenly changed his demeanor.  His face became red and he withdraw entirely from the discussion.  At the end of group, he explained he was angry and homesick, wanting to see his son and not understanding why he is being retained in the hospital.  CSW encouraged him to take advantage of programming today, and to have hope that when his treatment team returns tomorrow. They will see the progress he is making.  He did not calm down, but seemed rather to continue to ruminate and become angrier.  CSW alerted nurse as to what he had said, and how he was behaving.  Type of Therapy:  Process Group with Motivational Interviewing  Participation Level:  Active  Participation Quality:  Attentive, Inattentive, Resistant and Sharing  Affect:  Labile  Cognitive:  Alert  Insight:  Developing/Improving  Engagement in Therapy:  Developing/Improving  Modes of Intervention:   Education, Support and Processing, Activity  Ambrose Mantle, LCSW 11/01/2013, 12:15pm

## 2013-11-01 NOTE — Progress Notes (Signed)
D:Pt stated he is feeling very depressed and bumped. His gf came and visited him and it mad him sad that she had to leave him and he had to stay. Pt stated he still does not know when he gets to leave, and it bothers him. Pt stated we are not allowed to keep him here against his will. Pt denies SI/HI/AVH. Denies pain. Pt is complaining of anxiety and agitation. Pt is calm and cooperative. A: scheduled medications given. q 15 min safety checks. Support and encouragement offered R: pt remains safe on unit. No signs of distress or further complaints at this time.

## 2013-11-01 NOTE — Progress Notes (Addendum)
Patient ID: Jonathan Lin, male   DOB: 1976/04/16, 37 y.o.   MRN: [id number]  D: Patient presents with anxious affect and mood. Pt. Denies SI/HI and A/V Hallucinations. Patient does report pain but refuses any intervention and states, "tylenol doesn't work." Patient complains of anxiety and states, "I'm ready to get out of here and see my son." Patient is unhappy because he thought he was going to be discharged yesterday. Patient rates his depression at a 4/10 and hopelessness at 2/10 today.    A: Support and encouragement provided to the patient to talk to staff if he needs anything. Scheduled medications given to patient per physician's orders. PRN Vistaril given to patient to help with his complaint of anxiety.  R: Patient is cooperative with staff but continues to be anxious. Patient is seen in the milieu and going to groups. Q15 minute checks are maintained for safety.

## 2013-11-01 NOTE — Progress Notes (Signed)
[facility] MD Progress Note  11/01/2013 3:21 PM Jonathan Lin  MRN:  [id number] Subjective:  [place] has been compliant with the inpatient hospitalization program without any difficulties. Patient reported he has no symptoms of alcohol withdrawal and no panic attacks since second day after admission. Patient reported his anxiety is 3/10 and depression 3 of 10 and continued to have suicidal ideation but contracts for safety and denies being hopelessness and hopelessness. Patient has been anxious about getting home after discharged and hoping he could be discharged on Monday.   Diagnosis:   DSM5: Schizophrenia Disorders:   Obsessive-Compulsive Disorders:   Trauma-Stressor Disorders:   Substance/Addictive Disorders:  Alcohol Related Disorder - Severe (303.90) Depressive Disorders:  Major Depressive Disorder - Severe (296.23)  Axis I: ADHD, combined type, Major Depression, Recurrent severe, Substance Induced Mood Disorder and Alcohol dependence  ADL's:  Intact  Sleep: Fair  Appetite:  Fair  Suicidal Ideation:  Patient endorses suicidal ideation and contracts for safety in the hospital Homicidal Ideation:   AEB (as evidenced by):  Psychiatric Specialty Exam: ROS  Blood pressure 112/78, pulse 85, temperature 97.6 F (36.4 C), temperature source Oral, resp. rate 16, height 5\' 8"  (1.727 m), weight 78.472 kg (173 lb).Body mass index is 26.31 kg/(m^2).  General Appearance: Casual and Guarded  Eye Contact::  Fair  Speech:  Clear and Coherent  Volume:  Normal  Mood:  Anxious, Depressed, Hopeless and Worthless  Affect:  Constricted and Depressed  Thought Process:  Goal Directed and Intact  Orientation:  Full (Time, Place, and Person)  Thought Content:  Rumination  Suicidal Thoughts:  Yes.  without intent/plan  Homicidal Thoughts:  No  Memory:  Immediate;   Fair  Judgement:  Impaired  Insight:  Lacking  Psychomotor Activity:  Normal  Concentration:  Fair  Recall:  Fair  Akathisia:  NA   Handed:  Right  AIMS (if indicated):     Assets:  Communication Skills Desire for Improvement Physical Health Resilience Social Support  Sleep:  Number of Hours: 6.25   Current Medications: Current Facility-Administered Medications  Medication Dose Route Frequency Provider Last Rate Last Dose  . acetaminophen (TYLENOL) tablet 650 mg  650 mg Oral Q6H PRN Shuvon Rankin, NP   650 mg at 10/29/13 1338  . alum & mag hydroxide-simeth (MAALOX/MYLANTA) 200-200-20 MG/5ML suspension 30 mL  30 mL Oral Q4H PRN Shuvon Rankin, NP   30 mL at 10/31/13 1712  . chlordiazePOXIDE (LIBRIUM) capsule 25 mg  25 mg Oral Q6H PRN Rachael Fee, MD   25 mg at 10/31/13 4098  . hydrOXYzine (ATARAX/VISTARIL) tablet 25 mg  25 mg Oral Q6H PRN Rachael Fee, MD   25 mg at 11/01/13 1454  . influenza vac split quadrivalent PF (FLUARIX) injection 0.5 mL  0.5 mL Intramuscular Tomorrow-1000 Rachael Fee, MD      . magnesium hydroxide (MILK OF MAGNESIA) suspension 30 mL  30 mL Oral Daily PRN Shuvon Rankin, NP      . multivitamin with minerals tablet 1 tablet  1 tablet Oral Daily Shuvon Rankin, NP   1 tablet at 11/01/13 0816  . nicotine (NICODERM CQ - dosed in mg/24 hours) patch 21 mg  21 mg Transdermal Daily Rachael Fee, MD   21 mg at 11/01/13 0819  . pneumococcal 23 valent vaccine (PNU-IMMUNE) injection 0.5 mL  0.5 mL Intramuscular Tomorrow-1000 Rachael Fee, MD      . thiamine (VITAMIN B-1) tablet 100 mg  100 mg Oral Daily Shuvon Rankin,  NP   100 mg at 11/01/13 0816  . traZODone (DESYREL) tablet 50 mg  50 mg Oral QHS PRN Kristeen Mans, NP   50 mg at 10/31/13 2144  . venlafaxine XR (EFFEXOR-XR) 24 hr capsule 37.5 mg  37.5 mg Oral Q breakfast Nehemiah Settle, MD   37.5 mg at 11/01/13 6962    Lab Results: No results found for this or any previous visit (from the past 48 hour(s)).  Physical Findings: AIMS: Facial and Oral Movements Muscles of Facial Expression: None, normal Lips and Perioral Area: None,  normal Jaw: None, normal Tongue: None, normal,Extremity Movements Upper (arms, wrists, hands, fingers): None, normal Lower (legs, knees, ankles, toes): None, normal, Trunk Movements Neck, shoulders, hips: None, normal, Overall Severity Severity of abnormal movements (highest score from questions above): None, normal Incapacitation due to abnormal movements: None, normal, Dental Status Current problems with teeth and/or dentures?: Yes (cavities: front upper) Does patient usually wear dentures?: No  CIWA:  CIWA-Ar Total: 1 COWS:     Treatment Plan Summary: Daily contact with patient to assess and evaluate symptoms and progress in treatment Medication management  Plan: Treatment Plan/Recommendations:   1. Admit for crisis management and stabilization. 2. Medication management to reduce current symptoms to base line and improve the patient's overall level of functioning. Increase Effexor to 75 mg daily for depression. 3. Treat health problems as indicated. 4. Develop treatment plan to decrease risk of relapse upon discharge and to reduce the need for readmission. 5. Psycho-social education regarding relapse prevention and self care. 6. Health care follow up as needed for medical problems. 7. Restart home medications where appropriate.   Medical Decision Making Problem Points:  Established problem, worsening (2), Review of last therapy session (1) and Review of psycho-social stressors (1) Data Points:  Review or order clinical lab tests (1) Review or order medicine tests (1) Review of medication regiment & side effects (2) Review of new medications or change in dosage (2)  I certify that inpatient services furnished can reasonably be expected to improve the patient's condition.   Shakari Qazi,JANARDHAHA R. 11/01/2013, 3:21 PM

## 2013-11-01 NOTE — [facility] Group Notes (Signed)
[facility] Group Notes:  (Nursing/MHT/Case Management/Adjunct)  Date:  11/01/2013  Time:  5:12 PM  Type of Therapy:  Nurse Education: Movie on sobriety  Participation Level:  Active  Participation Quality:  Appropriate and Attentive  Affect:  Flat  Cognitive:  Alert and Appropriate  Insight:  Appropriate  Engagement in Group:  Engaged  Modes of Intervention:  Education  Summary of Progress/Problems:  Jonathan Lin 11/01/2013, 5:12 PM

## 2013-11-01 NOTE — Clinical Social Work Note (Signed)
At patient's request, CSW provided clothing to patient from the clothing closet, including shorts and a shirt.  Lin, Jonathan Abdou Jo 11/01/2013 4:30 PM     

## 2013-11-01 NOTE — [facility] Group Notes (Signed)
[facility] Group Notes:  (Nursing/MHT/Case Management/Adjunct)  Date:  11/01/2013  Time:  2:14 PM  Type of Therapy:  Nurse Education  Participation Level:  Active  Participation Quality:  Appropriate, Attentive, Sharing and Supportive  Affect:  Flat  Cognitive:  Alert, Appropriate and Oriented  Insight:  Good  Engagement in Group:  Engaged and Supportive  Modes of Intervention:  Discussion  Summary of Progress/Problems:  Jonathan Lin 11/01/2013, 2:14 PM

## 2013-11-02 DIAGNOSIS — F332 Major depressive disorder, recurrent severe without psychotic features: Secondary | ICD-10-CM

## 2013-11-02 DIAGNOSIS — F1994 Other psychoactive substance use, unspecified with psychoactive substance-induced mood disorder: Secondary | ICD-10-CM

## 2013-11-02 MED ORDER — ARIPIPRAZOLE 5 MG PO TABS
5.0000 mg | ORAL_TABLET | Freq: Every day | ORAL | Status: DC
  Administered 2013-11-02 – 2013-11-03 (×2): 5 mg via ORAL
  Filled 2013-11-02: qty 14
  Filled 2013-11-02 (×4): qty 1

## 2013-11-02 MED ORDER — LAMOTRIGINE 25 MG PO TABS
25.0000 mg | ORAL_TABLET | Freq: Every day | ORAL | Status: DC
  Administered 2013-11-02 – 2013-11-03 (×2): 25 mg via ORAL
  Filled 2013-11-02: qty 1
  Filled 2013-11-02: qty 14
  Filled 2013-11-02 (×3): qty 1

## 2013-11-02 NOTE — Progress Notes (Signed)
D: pt sitting in dayroom watching tv. Pt stated he is pretty bummed because his significant other came and saw him and didn't want her to leave. Pt also spoke how he is upset with the doctor he spoke with today. Pt was told that he should get put on antidepressants and when he tried to voice his opinion to the doctor the response given was not one of care and respect. Pt felt as though the doctor did not care and has poor people skills when it comes to speaking with others. Pt stated he is ready to go home and feels claustrophobic here. denies SI/HI/AVH. denies pain. Pt complains of anxiety and agitation. Pt is calm and cooperative. A: support and encouragement offered. q 15 min safety checks. Prn and scheduled medications given R: pt remains safe on unit. No further complaints at this time.

## 2013-11-02 NOTE — Progress Notes (Signed)
Adult Psychoeducational Group Note  Date:  11/02/2013 Time:  1:30 PM  Group Topic/Focus:  Self-Care Assessment  Participation Level:  Active  Participation Quality:  Appropriate, Sharing and Supportive  Affect:  Appropriate  Cognitive:  Alert and Appropriate  Insight: Appropriate  Engagement in Group:  Engaged and Supportive  Modes of Intervention:  Discussion, Education and Exploration  Additional Comments:  During this group Writer discussed the importance of Wellness and Self care. Patients had the opportunity to complete self care assessment in daily packet and discuss the results of assessment. Patients and Clinical research associate discussed the importance of working on the weak parts of the assessment and how to improve them  Lauralee Evener 11/02/2013, 1:30 PM

## 2013-11-02 NOTE — Progress Notes (Signed)
The focus of this group is to help patients review their daily goal of treatment and discuss progress on daily workbooks. Pt did not attend the evening group. 

## 2013-11-02 NOTE — [facility] Group Notes (Signed)
[facility] LCSW Aftercare Discharge Planning Group Note   11/02/2013 12:12 PM  Participation Quality:  Active and Engaged  Mood/Affect:  Anxious  Depression Rating:  Reports low depression 2  Anxiety Rating:  High anxiety: 7 due to unknown of DC and wanting to be home for Christmas  Thoughts of Suicide:  No Will you contract for safety?   Yes  Current AVH:  NA  Plan for Discharge/Comments:  Patient reports he will return home with his family and 85 year old son. Reports he was admitted due to his anxiety and having a panic attack at work. Reports he does not have a problem with alcohol.  Patient has lost his insurance and will follow up with [facility] at [place] for medication and therapy. Plan for DC 12/23.  Transportation Means: Patient reports he will have someone pick him up at DC  Supports: Patient reports his GF is a Chief Strategy Officer and very supportive. Reports he is involved in support groups in community.  Nail, Catalina Gravel

## 2013-11-02 NOTE — Progress Notes (Signed)
Adult Psychoeducational Group Note  Date:  11/02/2013 Time:  10:00AM Group Topic/Focus:  Therapuetic Activity  Participation Level:  Active  Participation Quality:  Appropriate and Attentive  Affect:  Appropriate  Cognitive:  Alert and Appropriate  Insight: Appropriate and Good  Engagement in Group:  Engaged  Modes of Intervention:  Discussion  Additional Comments:  Pt. Was attentive and appropriate during today's therapeutic activity. Pt was able to discuss developing a wellness toolbox.    Bing Plume D 11/02/2013, 1:46 PM

## 2013-11-02 NOTE — [facility] Group Notes (Signed)
[facility] LCSW Group Therapy  11/02/2013 3:13 PM  Type of Therapy:  Group Therapy  Participation Level:  Active  Participation Quality:  Attentive, Sharing and Supportive  Affect:  Anxious  Cognitive:  Alert and Oriented  Insight:  Developing/Improving  Engagement in Therapy:  Engaged  Modes of Intervention:  Discussion, Exploration and Support  Summary of Progress/Problems:  Edwen was very engaged in conversation even though he came in late for group. He shared his personal stories of how he changed and overcame obstacles with substance abuse and that was to eliminate all people from his life in efforts to get clean. He shared feelings of being lonely and hopeless, but found old things such as making music and reading pleasurable activities instead of getting high.  He talked a lot about his strong support system and knowing himself in efforts to remain clean.  The example he used was if meth was in the room, he would need someone to pull him out as this was very pleasurable to him.  He shared he does not put himself in these situations any longer to cause the temptation.  He has progressed greatly in understanding how being self aware can help keep you sober. He shares he has a one year old and wants to live to see him grow and not miss out of important milestones because he is drunk or high.   Nail, Catalina Gravel 11/02/2013, 3:13 PM

## 2013-11-02 NOTE — Progress Notes (Signed)
D:Pt rates his depression as a 3 on 1-10 scale with 10 being the most depressed. Pt reports that he wants to be with his son for Christmas. He plans to get a new job when he leaves and stop drinking and smoking. A:Offered support, encouragement and 15 minute checks. Gave medications as ordered. R:Pt denies si and hi. Safety maintained on the unit.

## 2013-11-02 NOTE — Progress Notes (Signed)
D   Pt requested to get medications early because he wants to go to bed early and is excited about being discharged tomorrow   He reports no withdrawal and only very mild depression   He said he has a plan for staying sober and trying to get back on his feet and plans to get another job A   Verbal support given   Medications administered and effectiveness monitored   Q 15 min checks R   Pt safe at present

## 2013-11-02 NOTE — Progress Notes (Signed)
Patient ID: Jonathan Lin, male   DOB: Mar 12, 1976, 37 y.o.   MRN: [id number] [facility] MD Progress Note  11/02/2013 4:46 PM Jonathan Lin  MRN:  [id number]  Subjective:  Jonathan Lin reports that he checked himself into this hospital because he was severely depressed, having racing thoughts, mood instability, high anxiety levels, emotional distress, nervous break down and disturbing thoughts. He says he used to have substance abuse issues in the past, and that is behind him now. However, he adds that he does drink to relax himself, and not to get drunk. He continue to endorse mood swings and racing thoughts. He declines to take and or be on Effexor as it has not agreed with him in the past. He asks for another form of mood stabilizers to try instead of Effexor. He states that will learn other ways to relax than using alcohol. He is reporting improved sleep.   Diagnosis:   DSM5: Schizophrenia Disorders:   Obsessive-Compulsive Disorders:   Trauma-Stressor Disorders:   Substance/Addictive Disorders:  Alcohol Related Disorder - Severe (303.90) Depressive Disorders:  Major Depressive Disorder - Severe (296.23)  Axis I: ADHD, combined type, Major Depression, Recurrent severe, Substance Induced Mood Disorder and Alcohol dependence  ADL's:  Intact  Sleep: Fair  Appetite:  Fair  Suicidal Ideation:  Patient endorses suicidal ideation and contracts for safety in the hospital Homicidal Ideation:   AEB (as evidenced by):  Psychiatric Specialty Exam: ROS  Blood pressure 144/81, pulse 82, temperature 98.1 F (36.7 C), temperature source Oral, resp. rate 16, height 5\' 8"  (1.727 m), weight 78.472 kg (173 lb).Body mass index is 26.31 kg/(m^2).  General Appearance: Casual and Guarded  Eye Contact::  Fair  Speech:  Clear and Coherent  Volume:  Normal  Mood:  Anxious, Depressed, Hopeless and Worthless  Affect:  Constricted and Depressed  Thought Process:  Goal Directed and Intact  Orientation:  Full (Time, Place,  and Person)  Thought Content:  Rumination  Suicidal Thoughts:  Yes.  without intent/plan  Homicidal Thoughts:  No  Memory:  Immediate;   Fair  Judgement:  Impaired  Insight:  Lacking  Psychomotor Activity:  Normal  Concentration:  Fair  Recall:  Fair  Akathisia:  NA  Handed:  Right  AIMS (if indicated):     Assets:  Communication Skills Desire for Improvement Physical Health Resilience Social Support  Sleep:  Number of Hours: 6.5   Current Medications: Current Facility-Administered Medications  Medication Dose Route Frequency Provider Last Rate Last Dose  . acetaminophen (TYLENOL) tablet 650 mg  650 mg Oral Q6H PRN Shuvon Rankin, NP   650 mg at 10/29/13 1338  . alum & mag hydroxide-simeth (MAALOX/MYLANTA) 200-200-20 MG/5ML suspension 30 mL  30 mL Oral Q4H PRN Shuvon Rankin, NP   30 mL at 10/31/13 1712  . ARIPiprazole (ABILIFY) tablet 5 mg  5 mg Oral Daily Sanjuana Kava, NP      . chlordiazePOXIDE (LIBRIUM) capsule 25 mg  25 mg Oral Q6H PRN Rachael Fee, MD   25 mg at 11/01/13 2004  . hydrOXYzine (ATARAX/VISTARIL) tablet 25 mg  25 mg Oral Q6H PRN Rachael Fee, MD   25 mg at 11/02/13 1339  . lamoTRIgine (LAMICTAL) tablet 25 mg  25 mg Oral Daily Sanjuana Kava, NP      . magnesium hydroxide (MILK OF MAGNESIA) suspension 30 mL  30 mL Oral Daily PRN Shuvon Rankin, NP      . multivitamin with minerals tablet 1 tablet  1 tablet  Oral Daily Shuvon Rankin, NP   1 tablet at 11/02/13 0819  . nicotine (NICODERM CQ - dosed in mg/24 hours) patch 21 mg  21 mg Transdermal Daily Rachael Fee, MD   21 mg at 11/02/13 0818  . thiamine (VITAMIN B-1) tablet 100 mg  100 mg Oral Daily Shuvon Rankin, NP   100 mg at 11/02/13 0819  . traZODone (DESYREL) tablet 50 mg  50 mg Oral QHS PRN Kristeen Mans, NP   50 mg at 11/01/13 2127    Lab Results: No results found for this or any previous visit (from the past 48 hour(s)).  Physical Findings: AIMS: Facial and Oral Movements Muscles of Facial Expression:  None, normal Lips and Perioral Area: None, normal Jaw: None, normal Tongue: None, normal,Extremity Movements Upper (arms, wrists, hands, fingers): None, normal Lower (legs, knees, ankles, toes): None, normal, Trunk Movements Neck, shoulders, hips: None, normal, Overall Severity Severity of abnormal movements (highest score from questions above): None, normal Incapacitation due to abnormal movements: None, normal, Dental Status Current problems with teeth and/or dentures?: Yes (cavities: front upper) Does patient usually wear dentures?: No  CIWA:  CIWA-Ar Total: 3 COWS:     Treatment Plan Summary: Daily contact with patient to assess and evaluate symptoms and progress in treatment Medication management  Plan: Supportive approach/coping skills/relapse prevention. Initiate Abilify 5 mg daily for mood control. Add Lamictal 25 mg daily for mood stabilization.  Encouraged out of room, participation in group sessions and application of coping skills when distressed. Will continue to monitor response to/adverse effects of medications in use to assure effectiveness. Continue to monitor mood, behavior and interaction with staff and other patients. Continue current plan of care.  Medical Decision Making Problem Points:  Established problem, worsening (2), Review of last therapy session (1) and Review of psycho-social stressors (1) Data Points:  Review or order clinical lab tests (1) Review or order medicine tests (1) Review of medication regiment & side effects (2) Review of new medications or change in dosage (2)  I certify that inpatient services furnished can reasonably be expected to improve the patient's condition.   Sanjuana Kava, PMHN, FNP 11/02/2013, 4:46 PM

## 2013-11-03 DIAGNOSIS — F1910 Other psychoactive substance abuse, uncomplicated: Secondary | ICD-10-CM

## 2013-11-03 MED ORDER — HYDROXYZINE HCL 25 MG PO TABS: 25.0000 mg | ORAL_TABLET | Freq: Four times a day (QID) | ORAL | Status: DC | PRN

## 2013-11-03 MED ORDER — LAMOTRIGINE 25 MG PO TABS: 25.0000 mg | ORAL_TABLET | Freq: Every day | ORAL | Status: DC

## 2013-11-03 MED ORDER — ARIPIPRAZOLE 5 MG PO TABS: 5.0000 mg | ORAL_TABLET | Freq: Every day | ORAL | Status: DC

## 2013-11-03 MED ORDER — TRAZODONE HCL 50 MG PO TABS: 50.0000 mg | ORAL_TABLET | Freq: Every evening | ORAL | Status: DC | PRN

## 2013-11-03 NOTE — Progress Notes (Signed)
Patient Discharge Instructions:  After Visit Summary (AVS):   Faxed to:  11/03/13 Discharge Summary Note:   Faxed to:  11/03/13 Psychiatric Admission Assessment Note:   Faxed to:  11/03/13 Suicide Risk Assessment - Discharge Assessment:   Faxed to:  11/03/13 Faxed/Sent to the Next Level Care provider:  11/03/13 Faxed to [facility] @ [phone]  Jerelene Redden, 11/03/2013, 4:44 PM

## 2013-11-03 NOTE — Progress Notes (Signed)
The focus of this group is to educate the patient on the purpose and policies of crisis stabilization and provide a format to answer questions about their admission.  The group details unit policies and expectations of patients while admitted. Patient was insightful and engaging. Patient was able to set a goal for discharge.

## 2013-11-03 NOTE — Discharge Summary (Signed)
Physician Discharge Summary Note  Patient:  Jonathan Lin is an 37 y.o., male MRN:  [id number] DOB:  Aug 10, 1976 Patient phone:  [phone] (home)  Patient address:   [address] [place] [address],   Date of Admission:  10/29/2013 Date of Discharge: 11/03/2013  Reason for Admission:  Alcohol dependency/withdrawal  Discharge Diagnoses: Active Problems:   MOOD DISORDER   MDD (major depressive disorder)   Alcohol dependence   ADHD (attention deficit hyperactivity disorder), combined type  Review of Systems  Constitutional: Negative.   HENT: Negative.   Eyes: Negative.   Respiratory: Negative.   Cardiovascular: Negative.   Gastrointestinal: Negative.   Genitourinary: Negative.   Musculoskeletal: Negative.   Skin: Negative.   Neurological: Negative.   Endo/Heme/Allergies: Negative.   Psychiatric/Behavioral: Positive for substance abuse.    DSM5:  Substance/Addictive Disorders:  Alcohol Related Disorder - Severe (303.90), Alcohol Intoxication with Use Disorder - Severe (F10.229), Alcohol Withdrawal (291.81), Cannabis Use Disorder - Severe (304.30) and Opioid Disorder - Severe (304.00) Depressive Disorders:  Major Depressive Disorder - Severe (296.23)  Axis Diagnosis:   AXIS I:  Alcohol Abuse, Anxiety Disorder NOS, Major Depression, Recurrent severe, Substance Abuse and Substance Induced Mood Disorder AXIS II:  Deferred AXIS III:   Past Medical History  Diagnosis Date  . Back pain   . Depression   . Alcoholism    AXIS IV:  economic problems, occupational problems, other psychosocial or environmental problems, problems related to social environment and problems with primary support group AXIS V:  61-70 mild symptoms  Level of Care:  OP  Hospital Course:  On admission:  37 Y/O male who endorses he has been drinking on and off in cycles. Starts with few beers build up to a fifth per day. Was drinking heavily when he was younger. Then socially. Now getting back to  the way it was before. admits to stress. States that he is a new father, has a "crappy job." Worked at [employer] for 13 years now [employer]. Sleep schedule is off. 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 Started using at 10 alcohol and marijuana then cocaine, meth, LSD, MDA, heroin. 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 He claims that in school he was the odd person out, the same at work. States he felt excluded and signaled out.  During hospitalization:  Medications managed--Librium alcohol protocol utilized successfully.  Abilify 5 mg daily for depression, Atarax 25 mg every 6 hours PRN anxiety, Lamictal 25 mg daily for mood stability, and Trazodone 50 mg at bedtime for sleep issues.  His Klonopin was not continued during inpatient.  Patient attended and participated in therapy.  He denied suicidal/homicidal ideations and auditory/visual hallucinations, follow-up appointments encouraged to attend, outside support groups encouraged and information given, Rx and 14 day supply of medications given.  Finis is mentally and physically stable for discharge.  Consults:  None  Significant Diagnostic Studies:  labs: completed, reviewed, stable  Discharge Vitals:   Blood pressure 127/85, pulse 87, temperature 98.1 F (36.7 C), temperature source Oral, resp. rate 16, height 5\' 8"  (1.727 m), weight 78.472 kg (173 lb). Body mass index is 26.31  kg/(m^2). Lab Results:   No results found for this or any previous visit (from the past 72 hour(s)).  Physical Findings: AIMS: Facial and Oral Movements Muscles of Facial Expression: None, normal Lips and Perioral Area: None, normal Jaw: None, normal Tongue: None,  normal,Extremity Movements Upper (arms, wrists, hands, fingers): None, normal Lower (legs, knees, ankles, toes): None, normal, Trunk Movements Neck, shoulders, hips: None, normal, Overall Severity Severity of abnormal movements (highest score from questions above): None, normal Incapacitation due to abnormal movements: None, normal, Dental Status Current problems with teeth and/or dentures?: Yes (cavities: front upper) Does patient usually wear dentures?: No  CIWA:  CIWA-Ar Total: 0 COWS:     Psychiatric Specialty Exam: See Psychiatric Specialty Exam and Suicide Risk Assessment completed by Attending Physician prior to discharge.  Discharge destination:  Home  Is patient on multiple antipsychotic therapies at discharge:  No   Has Patient had three or more failed trials of antipsychotic monotherapy by history:  No  Recommended Plan for Multiple Antipsychotic Therapies: NA  Discharge Orders   Future Orders Complete By Expires   Activity as tolerated - No restrictions  As directed    Diet - low sodium heart healthy  As directed        Medication List    STOP taking these medications       acetaminophen 500 MG tablet  Commonly known as:  TYLENOL     clonazePAM 0.5 MG tablet  Commonly known as:  KLONOPIN      TAKE these medications     Indication   ARIPiprazole 5 MG tablet  Commonly known as:  ABILIFY  Take 1 tablet (5 mg total) by mouth daily.   Indication:  Major Depressive Disorder     hydrOXYzine 25 MG tablet  Commonly known as:  ATARAX/VISTARIL  Take 1 tablet (25 mg total) by mouth every 6 (six) hours as needed for anxiety (For anxiety or CIWA  >=10).      lamoTRIgine 25 MG tablet  Commonly known as:  LAMICTAL  Take 1 tablet (25 mg total) by mouth daily.   Indication:  mood stability     traZODone 50 MG tablet  Commonly known as:  DESYREL  Take 1 tablet (50 mg total) by mouth at bedtime as needed for sleep.   Indication:  Trouble Sleeping            Follow-up Information   Follow up with [facility] On 11/04/2013. (Walk in clinic Monday - Friday 8 am - 3 pm for intake assessment, to start outpatient medication management and therapy)    Contact information:   [address], [place] [address] Phone: [phone] Fax: [phone]      Follow-up recommendations:  Activity:  as tolerated Diet:  low-sodium heart healthy diet  Comments:  Patient will [facility] his care at [facility].  Total Discharge Time:  Greater than 30 minutes.  SignedNanine Means, PMH-NP 11/03/2013, 8:30 AM  Patient was seen face-to-face for psychiatric evaluation, suicide risk assessment, case discussed with the physician extender and the treatment team, formulated treatment plan and later made disposition and plan. Reviewed the information documented and agree with the treatment plan.   Michaelle Bottomley,JANARDHAHA R. 11/03/2013 1:45 PM

## 2013-11-03 NOTE — Progress Notes (Signed)
[facility] Adult Case Management Discharge Plan :  Will you be returning to the same living situation after discharge: Yes,  home with roommate and GF At discharge, do you have transportation home?:Yes,  best friend coming to pick up Do you have the ability to pay for your medications:Yes,  patient needing samples until he can get meds filled.  Release of information consent forms completed and in the chart;  Patient's signature needed at discharge.  Patient to Follow up at: Follow-up Information   Follow up with [facility] On 11/04/2013. (Walk in clinic Monday - Friday 8 am - 3 pm for intake assessment, to start outpatient medication management and therapy)    Contact information:   [address], [place] [address] Phone: [phone] Fax: [phone]      Patient denies SI/HI:   Yes,  yes no reports    Safety Planning and Suicide Prevention discussed:  Yes,  completed, see SI note.  Nail, Catalina Gravel 11/03/2013, 10:02 AM

## 2013-11-03 NOTE — Progress Notes (Signed)
Patient ID: Jonathan Lin, male   DOB: May 11, 1976, 37 y.o.   MRN: [id number] He has been discharged home and was picked up by his roommate. He voiced understanding of discharge instruction and of follow up treatment plan. He denies thought to  Harm himself and all  His belongings were taken home with him.

## 2013-11-03 NOTE — [facility] Suicide Risk Assessment (Signed)
Suicide Risk Assessment  Discharge Assessment     Demographic Factors:  Male, Adolescent or young adult, Caucasian and Low socioeconomic status  Mental Status Per Nursing Assessment::   On Admission:  Suicidal ideation indicated by patient;Self-harm thoughts  Current Mental Status by Physician: Mental Status Examination: Patient appeared as per his stated age, casually dressed, and fairly groomed, and maintaining good eye contact. Patient has good mood and his affect was constricted. He has normal rate, rhythm, and volume of speech. His thought process is linear and goal directed. Patient has denied suicidal, homicidal ideations, intentions or plans. Patient has no evidence of auditory or visual hallucinations, delusions, and paranoia. Patient has fair insight judgment and impulse control.  Loss Factors: Financial problems/change in socioeconomic status  Historical Factors: Prior suicide attempts, Family history of mental illness or substance abuse and Impulsivity  Risk Reduction Factors:   Responsible for children under 1 years of age, Sense of responsibility to family, Religious beliefs about death, Employed, Living with another person, especially a relative, Positive social support, Positive therapeutic relationship and Positive coping skills or problem solving skills  Continued Clinical Symptoms:  Panic Attacks Depression:   Recent sense of peace/wellbeing Alcohol/Substance Abuse/Dependencies Previous Psychiatric Diagnoses and Treatments Medical Diagnoses and Treatments/Surgeries  Cognitive Features That Contribute To Risk:  Polarized thinking    Suicide Risk:  Minimal: No identifiable suicidal ideation.  Patients presenting with no risk factors but with morbid ruminations; may be classified as minimal risk based on the severity of the depressive symptoms  Discharge Diagnoses:   AXIS I:  ADHD, combined type, Major Depression, Recurrent severe, Panic Disorder and Alcohol  dependence AXIS II:  Deferred AXIS III:   Past Medical History  Diagnosis Date  . Back pain   . Depression   . Alcoholism    AXIS IV:  economic problems, other psychosocial or environmental problems, problems related to social environment and problems with primary support group AXIS V:  61-70 mild symptoms  Plan Of Care/Follow-up recommendations:  Activity:  As tolerated Diet:  Review of  Is patient on multiple antipsychotic therapies at discharge:  No   Has Patient had three or more failed trials of antipsychotic monotherapy by history:  No  Recommended Plan for Multiple Antipsychotic Therapies: NA  Daina Cara,JANARDHAHA R. 11/03/2013, 12:01 PM

## 2013-11-03 NOTE — Progress Notes (Signed)
Adult Psychoeducational Group Note  Date:  11/03/2013 Time:  1:22 PM  Group Topic/Focus:  Recovery Goals:   The focus of this group is to identify appropriate goals for recovery and establish a plan to achieve them.ICIPATION [id number]}  Participation Quality:  Appropriate, Attentive, Sharing and Supportive  Affect:  Appropriate  Cognitive:  Alert and Appropriate  Insight: Appropriate and Good  Engagement in Group:  Engaged  Modes of Intervention:  Activity, Discussion, Education, Exploration, Socialization and Support  Additional Comments:  Pt came to group and was very sharing and supportive of the other group members. Pt shared that his job and not setting goals or being positive are standing between him and recovery. Pt plans on changing this by going to a local temp agency and going to one-on-one therapy.   Cathlean Cower 11/03/2013, 1:22 PM

## 2013-12-24 ENCOUNTER — Encounter (HOSPITAL_COMMUNITY): Payer: Self-pay | Admitting: Emergency Medicine

## 2013-12-24 ENCOUNTER — Emergency Department (HOSPITAL_COMMUNITY)
Admission: EM | Admit: 2013-12-24 | Discharge: 2013-12-25 | Disposition: A | Discharge status: Other Acute Inpatient Hospital | Payer: Worker's Compensation | Attending: Emergency Medicine | Admitting: Emergency Medicine

## 2013-12-24 ENCOUNTER — Ambulatory Visit (HOSPITAL_COMMUNITY)
Admission: RE | Admit: 2013-12-24 | Discharge: 2013-12-24 | Disposition: A | Discharge status: Home / Self Care | Payer: Self-pay | Attending: Psychiatry | Admitting: Psychiatry

## 2013-12-24 ENCOUNTER — Encounter (HOSPITAL_COMMUNITY): Payer: Self-pay | Admitting: *Deleted

## 2013-12-24 DIAGNOSIS — D369 Benign neoplasm, unspecified site: Secondary | ICD-10-CM

## 2013-12-24 DIAGNOSIS — F17200 Nicotine dependence, unspecified, uncomplicated: Secondary | ICD-10-CM | POA: Insufficient documentation

## 2013-12-24 DIAGNOSIS — F3289 Other specified depressive episodes: Secondary | ICD-10-CM | POA: Insufficient documentation

## 2013-12-24 DIAGNOSIS — R45851 Suicidal ideations: Secondary | ICD-10-CM

## 2013-12-24 DIAGNOSIS — Z79899 Other long term (current) drug therapy: Secondary | ICD-10-CM | POA: Insufficient documentation

## 2013-12-24 DIAGNOSIS — F329 Major depressive disorder, single episode, unspecified: Secondary | ICD-10-CM | POA: Insufficient documentation

## 2013-12-24 DIAGNOSIS — F1021 Alcohol dependence, in remission: Secondary | ICD-10-CM | POA: Insufficient documentation

## 2013-12-24 HISTORY — DX: Benign neoplasm, unspecified site: D36.9

## 2013-12-24 LAB — COMPREHENSIVE METABOLIC PANEL
ALT: 24 U/L (ref 0–53)
AST: 21 U/L (ref 0–37)
Albumin: 4.3 g/dL (ref 3.5–5.2)
Alkaline Phosphatase: 56 U/L (ref 39–117)
BUN: 13 mg/dL (ref 6–23)
CO2: 23 mEq/L (ref 19–32)
Calcium: 9.6 mg/dL (ref 8.4–10.5)
Chloride: 102 mEq/L (ref 96–112)
Creatinine, Ser: 0.66 mg/dL (ref 0.50–1.35)
GFR calc Af Amer: >90 mL/min (ref >90)
GFR calc non Af Amer: >90 mL/min (ref >90)
Glucose, Bld: 101 mg/dL — ABNORMAL HIGH (ref 70–99)
Potassium: 4.1 mEq/L (ref 3.7–5.3)
Sodium: 139 mEq/L (ref 137–147)
Total Bilirubin: 0.5 mg/dL (ref 0.3–1.2)
Total Protein: 7.8 g/dL (ref 6.0–8.3)

## 2013-12-24 LAB — SALICYLATE LEVEL: Salicylate Lvl: <2 mg/dL — ABNORMAL LOW (ref 2.8–20.0)

## 2013-12-24 LAB — CBC
HCT: 44.8 % (ref 39.0–52.0)
Hemoglobin: 15.3 g/dL (ref 13.0–17.0)
MCH: 30.5 pg (ref 26.0–34.0)
MCHC: 34.2 g/dL (ref 30.0–36.0)
MCV: 89.4 fL (ref 78.0–100.0)
Platelets: 219 10*3/uL (ref 150–400)
RBC: 5.01 MIL/uL (ref 4.22–5.81)
RDW: 12.7 % (ref 11.5–15.5)
WBC: 6 10*3/uL (ref 4.0–10.5)

## 2013-12-24 LAB — ETHANOL: Alcohol, Ethyl (B): <11 mg/dL (ref 0–11)

## 2013-12-24 LAB — ACETAMINOPHEN LEVEL: Acetaminophen (Tylenol), Serum: <15 ug/mL (ref 10–30)

## 2013-12-24 MED ORDER — ADULT MULTIVITAMIN W/MINERALS CH
1.0000 | ORAL_TABLET | Freq: Every day | ORAL | Status: DC
  Administered 2013-12-24: 1 via ORAL
  Filled 2013-12-24: qty 1

## 2013-12-24 MED ORDER — LOPERAMIDE HCL 2 MG PO CAPS: 2.0000 mg | ORAL_CAPSULE | ORAL | Status: DC | PRN

## 2013-12-24 MED ORDER — HYDROXYZINE HCL 25 MG PO TABS: 25.0000 mg | ORAL_TABLET | Freq: Four times a day (QID) | ORAL | Status: DC | PRN

## 2013-12-24 MED ORDER — VITAMIN B-1 100 MG PO TABS: 100.0000 mg | ORAL_TABLET | Freq: Every day | ORAL | Status: DC

## 2013-12-24 MED ORDER — CHLORDIAZEPOXIDE HCL 25 MG PO CAPS: 25.0000 mg | ORAL_CAPSULE | Freq: Three times a day (TID) | ORAL | Status: DC

## 2013-12-24 MED ORDER — CHLORDIAZEPOXIDE HCL 25 MG PO CAPS
50.0000 mg | ORAL_CAPSULE | Freq: Once | ORAL | Status: DC
  Administered 2013-12-24: 50 mg via ORAL
  Filled 2013-12-24: qty 2

## 2013-12-24 MED ORDER — THIAMINE HCL 100 MG/ML IJ SOLN
100.0000 mg | Freq: Once | INTRAMUSCULAR | Status: DC
  Administered 2013-12-24: 100 mg via INTRAMUSCULAR
  Filled 2013-12-24: qty 2

## 2013-12-24 MED ORDER — CHLORDIAZEPOXIDE HCL 25 MG PO CAPS: 25.0000 mg | ORAL_CAPSULE | Freq: Four times a day (QID) | ORAL | Status: DC | PRN

## 2013-12-24 MED ORDER — CHLORDIAZEPOXIDE HCL 25 MG PO CAPS: 25.0000 mg | ORAL_CAPSULE | ORAL | Status: DC

## 2013-12-24 MED ORDER — ONDANSETRON 4 MG PO TBDP: 4.0000 mg | ORAL_TABLET | Freq: Four times a day (QID) | ORAL | Status: DC | PRN

## 2013-12-24 MED ORDER — CHLORDIAZEPOXIDE HCL 25 MG PO CAPS
25.0000 mg | ORAL_CAPSULE | Freq: Four times a day (QID) | ORAL | Status: DC
  Administered 2013-12-24: 25 mg via ORAL
  Filled 2013-12-24: qty 1

## 2013-12-24 MED ORDER — CHLORDIAZEPOXIDE HCL 25 MG PO CAPS: 25.0000 mg | ORAL_CAPSULE | Freq: Every day | ORAL | Status: DC

## 2013-12-24 NOTE — ED Notes (Signed)
Pt BIB EMS. Pt sent from [facility]. There is no bed for pt at [facility] currently. Pt detoxing from ETOH. Last drink last night. Pt drinks about half a fifth of ETOH daily. Denies other drug use. Pt having suicidal thoughts per EMS. Pt calm, cooperative per EMS.

## 2013-12-24 NOTE — ED Provider Notes (Signed)
CSN: [id number]     Arrival date & time 12/24/13  1701 History   First MD Initiated Contact with Patient 12/24/13 1808     Chief Complaint  Patient presents with  . Depression  . Suicidal     (Consider location/radiation/quality/duration/timing/severity/associated sxs/prior Treatment) HPI Comments: Patient is a 38 year old male with a past medical history of depression and alcoholism who presents with suicidal ideations for the past few days. Patient reports a history of alcoholism and depression "since he can remember." He reports drinking almost daily. He drank a pint of brandy and then went "out" and bought more drinks but he doesn't remember. He reports drinking almost daily. He reports suicidal ideations without a plan. He denies HI. Patient used to take medication for depression but he felt like a zombie while taking it. Patient denies drug use.    Past Medical History  Diagnosis Date  . Back pain   . Depression   . Alcoholism   . 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   Past Surgical History  Procedure Laterality Date  . Extraction of wisdom teeth    . Wisdom tooth extraction  1994   No family history on file. History  Substance Use Topics  . Smoking status: Current Every Day Smoker -- 0.50 packs/day    Types: Cigarettes  . Smokeless tobacco: Not on file  . Alcohol Use: Yes     Comment: 12pk/day plus liquor    Review of Systems  Constitutional: Negative for fever, chills and fatigue.  HENT: Negative for trouble swallowing.   Eyes: Negative for visual disturbance.  Respiratory: Negative for shortness of breath.   Cardiovascular: Negative for chest pain and palpitations.  Gastrointestinal: Negative for nausea, vomiting, abdominal pain and diarrhea.  Genitourinary: Negative for dysuria and difficulty urinating.  Musculoskeletal: Negative for arthralgias and neck pain.  Skin: Negative for color change.  Neurological: Negative for  dizziness and weakness.  Psychiatric/Behavioral: Positive for suicidal ideas and dysphoric mood.      Allergies  Review of patient's allergies indicates no known allergies.  Home Medications   Current Outpatient Rx  Name  Route  Sig  Dispense  Refill  . ARIPiprazole (ABILIFY) 5 MG tablet   Oral   Take 1 tablet (5 mg total) by mouth daily.   30 tablet   0   . hydrOXYzine (ATARAX/VISTARIL) 25 MG tablet   Oral   Take 1 tablet (25 mg total) by mouth every 6 (six) hours as needed for anxiety (For anxiety or CIWA  >=10).   30 tablet   0   . lamoTRIgine (LAMICTAL) 25 MG tablet   Oral   Take 1 tablet (25 mg total) by mouth daily.   30 tablet   0   . traZODone (DESYREL) 50 MG tablet   Oral   Take 1 tablet (50 mg total) by mouth at bedtime as needed for sleep.   30 tablet   0    BP 136/74  Pulse 76  Temp(Src) 98.2 F (36.8 C) (Oral)  Resp 16  SpO2 100% Physical Exam  Nursing note and vitals reviewed. Constitutional: He is oriented to person, place, and time. He appears well-developed and well-nourished. No distress.  HENT:  Head: Normocephalic and atraumatic.  Eyes: Conjunctivae are normal.  Neck: Normal range of motion.  Cardiovascular: Normal rate and regular rhythm.  Exam reveals no gallop and no friction rub.   No murmur heard. Pulmonary/Chest: Effort normal  and breath sounds normal. He has no wheezes. He has no rales. He exhibits no tenderness.  Abdominal: Soft. There is no tenderness.  Musculoskeletal: Normal range of motion.  Neurological: He is alert and oriented to person, place, and time. Coordination normal.  Speech is goal-oriented. Moves limbs without ataxia.   Skin: Skin is warm and dry.  Psychiatric: His behavior is normal.  Flat affect.     ED Course  Procedures (including critical care time) Labs Review Labs Reviewed  COMPREHENSIVE METABOLIC PANEL - Abnormal; Notable for the following:    Glucose, Bld 101 (*)    All other components within  normal limits  SALICYLATE LEVEL - Abnormal; Notable for the following:    Salicylate Lvl <8.2 (*)    All other components within normal limits  ACETAMINOPHEN LEVEL  CBC  ETHANOL  URINE RAPID DRUG SCREEN (HOSP PERFORMED)   Imaging Review No results found.  EKG Interpretation   None       MDM   Final diagnoses:  None   6:19 PM Patient will be moved to the psych ED for further evaluation.      Alvina Chou, [place] 12/24/13 1907

## 2013-12-24 NOTE — ED Notes (Signed)
Security at bs for wanding, pt's belongings secured.

## 2013-12-24 NOTE — ED Provider Notes (Signed)
Medical screening examination/treatment/procedure(s) were performed by non-physician practitioner and as supervising physician I was immediately available for consultation/collaboration.  EKG Interpretation   None         [name], MD 12/24/13 2239

## 2013-12-24 NOTE — ED Notes (Signed)
Report to RN [facility].  Pending [facility] transport.

## 2013-12-24 NOTE — ED Notes (Signed)
Pt a+ox4, presents with c/o depression and alcoholism "since I was a teenager", but reports thoughts of suicide recently.  Pt denies plan, denies AH/VH.  Pt denies HI.  Pt denies drugs other than ETOH.  Reports last drink last night.  Calm/coop.  No tongue fasciculations or other tremors.  Speaking full/clear sentences, rr even/un-lab.  MAEI.  Skin PWD.  NAD.
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
## 2013-12-25 ENCOUNTER — Encounter (HOSPITAL_COMMUNITY): Payer: Self-pay | Admitting: *Deleted

## 2013-12-25 ENCOUNTER — Inpatient Hospital Stay (HOSPITAL_COMMUNITY)
Admission: AD | Admit: 2013-12-25 | Discharge: 2013-12-28 | DRG: 897 | Disposition: A | Discharge status: Home / Self Care | Payer: No Typology Code available for payment source | Source: Intra-hospital | Attending: Psychiatry | Admitting: Psychiatry

## 2013-12-25 DIAGNOSIS — F909 Attention-deficit hyperactivity disorder, unspecified type: Secondary | ICD-10-CM

## 2013-12-25 DIAGNOSIS — F1020 Alcohol dependence, uncomplicated: Principal | ICD-10-CM | POA: Diagnosis present

## 2013-12-25 DIAGNOSIS — F39 Unspecified mood [affective] disorder: Secondary | ICD-10-CM

## 2013-12-25 DIAGNOSIS — F329 Major depressive disorder, single episode, unspecified: Secondary | ICD-10-CM

## 2013-12-25 DIAGNOSIS — F902 Attention-deficit hyperactivity disorder, combined type: Secondary | ICD-10-CM

## 2013-12-25 DIAGNOSIS — G4700 Insomnia, unspecified: Secondary | ICD-10-CM | POA: Diagnosis present

## 2013-12-25 LAB — RAPID URINE DRUG SCREEN, HOSP PERFORMED
Amphetamines: NOT DETECTED
Barbiturates: NOT DETECTED
Benzodiazepines: NOT DETECTED
Cocaine: NOT DETECTED
Opiates: NOT DETECTED
Tetrahydrocannabinol: POSITIVE — AB

## 2013-12-25 MED ORDER — CHLORDIAZEPOXIDE HCL 25 MG PO CAPS
25.0000 mg | ORAL_CAPSULE | Freq: Four times a day (QID) | ORAL | Status: AC
  Administered 2013-12-25 – 2013-12-26 (×6): 25 mg via ORAL
  Filled 2013-12-25 (×6): qty 1

## 2013-12-25 MED ORDER — LAMOTRIGINE 25 MG PO TABS
25.0000 mg | ORAL_TABLET | Freq: Every day | ORAL | Status: DC
  Administered 2013-12-25: 25 mg via ORAL
  Filled 2013-12-25 (×2): qty 1

## 2013-12-25 MED ORDER — MAGNESIUM HYDROXIDE 400 MG/5ML PO SUSP: 30.0000 mL | Freq: Every day | ORAL | Status: DC | PRN

## 2013-12-25 MED ORDER — CHLORDIAZEPOXIDE HCL 25 MG PO CAPS
25.0000 mg | ORAL_CAPSULE | ORAL | Status: AC
  Administered 2013-12-27 – 2013-12-28 (×2): 25 mg via ORAL
  Filled 2013-12-25 (×2): qty 1

## 2013-12-25 MED ORDER — TRAZODONE HCL 50 MG PO TABS
50.0000 mg | ORAL_TABLET | Freq: Every evening | ORAL | Status: DC | PRN
  Administered 2013-12-25 – 2013-12-27 (×4): 50 mg via ORAL
  Filled 2013-12-25 (×4): qty 1

## 2013-12-25 MED ORDER — THIAMINE HCL 100 MG/ML IJ SOLN: 100.0000 mg | Freq: Once | INTRAMUSCULAR | Status: DC

## 2013-12-25 MED ORDER — CHLORDIAZEPOXIDE HCL 25 MG PO CAPS
25.0000 mg | ORAL_CAPSULE | Freq: Four times a day (QID) | ORAL | Status: AC | PRN
  Administered 2013-12-26: 25 mg via ORAL

## 2013-12-25 MED ORDER — NICOTINE 21 MG/24HR TD PT24
21.0000 mg | MEDICATED_PATCH | Freq: Every day | TRANSDERMAL | Status: DC
  Administered 2013-12-25 – 2013-12-28 (×4): 21 mg via TRANSDERMAL
  Filled 2013-12-25 (×5): qty 1

## 2013-12-25 MED ORDER — CHLORDIAZEPOXIDE HCL 25 MG PO CAPS: 50.0000 mg | ORAL_CAPSULE | Freq: Once | ORAL | Status: DC

## 2013-12-25 MED ORDER — ACETAMINOPHEN 325 MG PO TABS
650.0000 mg | ORAL_TABLET | Freq: Four times a day (QID) | ORAL | Status: DC | PRN
  Administered 2013-12-25 – 2013-12-27 (×3): 650 mg via ORAL
  Filled 2013-12-25 (×3): qty 2

## 2013-12-25 MED ORDER — ADULT MULTIVITAMIN W/MINERALS CH
1.0000 | ORAL_TABLET | Freq: Every day | ORAL | Status: DC
  Administered 2013-12-25 – 2013-12-28 (×4): 1 via ORAL
  Filled 2013-12-25 (×5): qty 1

## 2013-12-25 MED ORDER — LOPERAMIDE HCL 2 MG PO CAPS
2.0000 mg | ORAL_CAPSULE | ORAL | Status: AC | PRN
Start: 2013-12-25 — End: 2013-12-28

## 2013-12-25 MED ORDER — CHLORDIAZEPOXIDE HCL 25 MG PO CAPS: 25.0000 mg | ORAL_CAPSULE | Freq: Every day | ORAL | Status: DC

## 2013-12-25 MED ORDER — ALUM & MAG HYDROXIDE-SIMETH 200-200-20 MG/5ML PO SUSP: 30.0000 mL | ORAL | Status: DC | PRN

## 2013-12-25 MED ORDER — VITAMIN B-1 100 MG PO TABS
100.0000 mg | ORAL_TABLET | Freq: Every day | ORAL | Status: DC
  Administered 2013-12-25 – 2013-12-28 (×4): 100 mg via ORAL
  Filled 2013-12-25 (×5): qty 1

## 2013-12-25 MED ORDER — ARIPIPRAZOLE 5 MG PO TABS
5.0000 mg | ORAL_TABLET | Freq: Every day | ORAL | Status: DC
  Administered 2013-12-25: 5 mg via ORAL
  Filled 2013-12-25 (×2): qty 1

## 2013-12-25 MED ORDER — ONDANSETRON 4 MG PO TBDP: 4.0000 mg | ORAL_TABLET | Freq: Four times a day (QID) | ORAL | Status: AC | PRN

## 2013-12-25 MED ORDER — HYDROXYZINE HCL 25 MG PO TABS
25.0000 mg | ORAL_TABLET | Freq: Four times a day (QID) | ORAL | Status: AC | PRN
  Administered 2013-12-26 – 2013-12-27 (×3): 25 mg via ORAL
  Filled 2013-12-25 (×3): qty 1

## 2013-12-25 MED ORDER — CHLORDIAZEPOXIDE HCL 25 MG PO CAPS
25.0000 mg | ORAL_CAPSULE | Freq: Three times a day (TID) | ORAL | Status: AC
  Administered 2013-12-26 – 2013-12-27 (×3): 25 mg via ORAL
  Filled 2013-12-25 (×4): qty 1

## 2013-12-25 NOTE — Progress Notes (Signed)
Adult Psychoeducational Group Note  Date:  12/25/2013 Time:  11:22 AM  Group Topic/Focus:  Building Self Esteem:   The Focus of this group is helping patients become aware of the effects of self-esteem on their lives, the things they and others do that enhance or undermine their self-esteem, seeing the relationship between their level of self-esteem and the choices they make and learning ways to enhance self-esteem. Self Esteem Action Plan:   The focus of this group is to help patients create a plan to continue to build self-esteem after discharge.  Participation Level:  Engaged   Participation Quality:  Appropriate  Affect:  Appropriate  Cognitive:  Appropriate  Insight: Good  Engagement in Group:  Engaged  Modes of Intervention:  Discussion, Socialization and Support  Additional Comments:  Pts discussed personal experiences of what depletes their self-esteem and what has increased self-esteem in their lives. Pt said one thing that depletes his self esteem is himself, feeling worthless, and negative self-talk. Pt states he can increase his self-esteem by stop using alcohol and drugs as a crutch and to get back into music.  Jonathan Lin 12/25/2013, 11:22 AM

## 2013-12-25 NOTE — [facility] Group Notes (Signed)
[facility] LCSW Aftercare Discharge Planning Group Note   12/25/2013 10:12 AM  Participation Quality:  Appropriate   Mood/Affect:  Depressed and Flat  Depression Rating:  6-7  Anxiety Rating:  5  Thoughts of Suicide:  No Will you contract for safety?   NA  Current AVH:  No  Plan for Discharge/Comments:  Pt reports frequent panic attacks and recent relapse in December 2014. Pt reports moderate withdrawals. Pt unsure if he wants i/p or o/p treatment and plans to weigh options over the weekend. CSW assessing.   Transportation Means: friend   Supports:some friends   Proofreader, Research officer, trade union

## 2013-12-25 NOTE — Progress Notes (Signed)
D: Patient denies SI/HI and A/V hallucinations; patient declined self inventory today; patient reports tremors and reports that depression 6-7/10; anxiety 3/10  A: Monitored q 15 minutes; patient encouraged to attend groups; patient educated about medications; patient given medications per physician orders; patient encouraged to express feelings and/or concerns  R: Patient is flat, sad, but cooperative; patient has been in the bed all afternoon; patient's interaction with staff and peers is appropriate but very minimal; patient was able to set goal to talk with staff 1:1 when having feelings of SI; patient is taking medications as prescribed and tolerating medications; patient is attending some groups
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
## 2013-12-25 NOTE — Tx Team (Signed)
Interdisciplinary Treatment Plan Update (Adult)  Date: 12/25/2013   Time Reviewed: 11:43 AM  Progress in Treatment:  Attending groups: yes  Participating in groups: yes  Taking medication as prescribed: Yes  Tolerating medication: Yes  Family/Significant othe contact made: Not yet. SPE required for this pt.  Patient understands diagnosis: Yes, AEB seeking treatment for ETOH detox, SI with plan, mood stabilization, and medication management.  Discussing patient identified problems/goals with staff: Yes  Medical problems stabilized or resolved: Yes  Denies suicidal/homicidal ideation: Yes during group/self report.  Patient has not harmed self or Others: Yes  New problem(s) identified:  Discharge Plan or Barriers: Pt hoping for referral to inpatient facility-[facility] for med management. CSW assessing.  Additional comments: 38 Y/O male who endorses he has been drinking on and off in cycles. Starts with few beers build up to a fifth per day. Was drinking heavily when he was younger. Then socially. Now getting back to the way it was before. admits to stress. States that he is a new father, has a "crappy job." Worked at [employer] for 13 years now [employer]. Sleep schedule is off. 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 Started using at 10 alcohol and marijuana then cocaine, meth, LSD, MDA, heroin. States that his baseline is having racing thoughts, being distractible having "faster RPMs in my head" He states that he was wanting a career as a Doctor, hospital and he actually was doing it but changes in the nature of the business and his drug use got in the way.  Reason for Continuation of Hospitalization: Librium taper-withdrawals Mood stabilization Medication management  Estimated length of stay: 3-5 days  For review of initial/current patient goals, please see plan of care.  Attendees:  Patient:    Family:     Physician: Carlton Adam MD 12/25/2013 11:42 AM   Nursing: Doroteo Bradford RN 12/25/2013 11:42 AM   Clinical Social Worker [place] [place]  12/25/2013 11:42 AM   Other: Karsten Fells RN  12/25/2013 11:42 AM   Other: Hardie Pulley. PA 12/25/2013 11:42 AM   Other: Gerline Legacy Nurse CM  12/25/2013 11:42 AM   Other:    Scribe for Treatment Team:  [place] LCSWA 12/25/2013 11:43 AM

## 2013-12-25 NOTE — Tx Team (Signed)
Initial Interdisciplinary Treatment Plan  PATIENT STRENGTHS: (choose at least two) Ability for insight Average or above average intelligence Capable of independent living Communication skills General fund of knowledge Motivation for treatment/growth Physical Health Supportive family/friends Work skills  PATIENT STRESSORS: Financial difficulties Loss of grandfather 1 wk ago, friend suicided 4-9m ago Medication change or noncompliance Substance abuse   PROBLEM LIST: Problem List/Patient Goals Date to be addressed Date deferred Reason deferred Estimated date of resolution  ETOH dependence 12/25/13     SI 12/25/13                                                DISCHARGE CRITERIA:  Ability to meet basic life and health needs Improved stabilization in mood, thinking, and/or behavior Need for constant or close observation no longer present Reduction of life-threatening or endangering symptoms to within safe limits Verbal commitment to aftercare and medication compliance Withdrawal symptoms are absent or subacute and managed without 24-hour nursing intervention  PRELIMINARY DISCHARGE PLAN: Attend aftercare/continuing care group Attend 12-step recovery group Placement in alternative living arrangements Return to previous work or school arrangements  PATIENT/FAMIILY INVOLVEMENT: This treatment plan has been presented to and reviewed with the patient, Jonathan Lin, and/or family member.  The patient and family have been given the opportunity to ask questions and make suggestions.  Loletta Specter [facility] 12/25/2013, 1:42 AM

## 2013-12-25 NOTE — BHH Counselor (Signed)
Adult Psychosocial Assessment Update Interdisciplinary Team  Previous [facility] admissions/discharges:  Admissions Discharges  Date: 10/24/14 Date: unknown at this time   Date: 10/29/13 Date: 11/03/13  Date: Date:  Date: Date:  Date: Date:   Changes since the last Psychosocial Assessment (including adherence to outpatient mental health and/or substance abuse treatment, situational issues contributing to decompensation and/or relapse). 38 Y/O male who endorses he has been drinking on and off in cycles. Starts with few beers build up to a fifth per day. Was drinking heavily when he was younger. Then socially. Now getting back to the way it was before. admits to stress. States that he is a new father, has a "crappy job." Worked at [employer] for 13 years now [employer]. Sleep schedule is off. 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 Started using at 10 alcohol and marijuana then cocaine, meth, LSD, MDA, heroin. States that his baseline is having racing thoughts, being distractible having "faster RPMs in my head" He states that he was wanting a career as a Doctor, hospital and he actually was doing it but changes in the nature of the business and his drug use got in the way.              Discharge Plan 1. Will you be returning to the same living situation after discharge?   Yes: No:      If no, what is your plan?    Pt was living with friends but is hoping to get accepted inpatient for treatment.        2. Would you like a referral for services when you are discharged? Yes:     If yes, for what services?  No:       [facility] and [facility] for med management. CSW assessing.        Summary and Recommendations (to be completed by the evaluator) Pt is 38 year old male [place]. Pt [facility] to [facility] for ETOH detox, mood stabilization, and due to SI with plan.  Recommendations for pt include: crisis stabilization, therapeutic milieu, encourage group attendance and participation, librium taper for withdrawals, medication management for mood stabilization, and development of comprehensive mental wellness/sobriety plan. Pt referrals made to [facility] and [facility] (waiting for bed availability) and [facility] for med management.                        Signature:  Higinio Roger  12/25/2013 11:53 AM

## 2013-12-25 NOTE — BHH Suicide Risk Assessment (Signed)
[place] INPATIENT:  Family/Significant Other Suicide Prevention Education  Suicide Prevention Education:  Education Completed; Lawrence Marseilles (pt's girlfriend) [phone] has been identified by the patient as the family member/significant other with whom the patient will be residing, and identified as the person(s) who will aid the patient in the event of a mental health crisis (suicidal ideations/suicide attempt).  With written consent from the patient, the family member/significant other has been provided the following suicide prevention education, prior to the and/or following the discharge of the patient.  The suicide prevention education provided includes the following:  Suicide risk factors  Suicide prevention and interventions  National Suicide Hotline telephone number  [facility] assessment telephone number  [facility] Emergency Assistance [place] and/or Residential Mobile Crisis Unit telephone number  Request made of family/significant other to:  Remove weapons (e.g., guns, rifles, knives), all items previously/currently identified as safety concern.    Remove drugs/medications (over-the-counter, prescriptions, illicit drugs), all items previously/currently identified as a safety concern.  The family member/significant other verbalizes understanding of the suicide prevention education information provided.  The family member/significant other agrees to remove the items of safety concern listed above.  Smart, Foxx Klarich LCSWA  12/25/2013, 2:55 PM
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
## 2013-12-25 NOTE — BHH Group Notes (Signed)
[place] LCSW Group Therapy  12/25/2013 2:56 PM  Type of Therapy:  Group Therapy  Participation Level:  Did Not Attend-pt meeting with MD, then went to bed.   Smart, Jonathan Lin LCSWA  12/25/2013, 2:56 PM

## 2013-12-25 NOTE — Progress Notes (Signed)
Pt attended AA meeting.  

## 2013-12-26 DIAGNOSIS — F10988 Alcohol use, unspecified with other alcohol-induced disorder: Secondary | ICD-10-CM

## 2013-12-26 NOTE — Progress Notes (Addendum)
Patient ID: Jonathan Lin, male   DOB: Feb 11, 1976, 38 y.o.   MRN: [id number] D: Client is visible on the unit and is minimally interactive with peers; isolates to himself. Affect is blunted; describes mood is depressed; depression 8/10; hopelessness 6/10; appearance is discheveled; sleep poor; appetite good; energy "low"; concentration "improving". Client is guarded during 1:1 interaction. Endorses SI.   A: Continue to encourage group attendance and participation, offer support and assistance in identifying triggers and development of coping skills and encouraged client to share thoughts and feelings to staff. Maintained safety through q 15 minute safety checks by staff.    R: Client is medication compliant and verbalizes safety to staff. Contracts for safety. States he is having cravings and tremors. Client states he has had persistent SI since he was 38 years old and will not act on his thoughts.

## 2013-12-26 NOTE — BHH Group Notes (Signed)
[place] Group Notes:  (Nursing/MHT/Case Management/Adjunct) Psychoeducational Group Note  Date:  12/26/2013 Time:  0900  Group Topic/Focus:  Healthy Communication:   The focus of this group is to discuss communication, barriers to communication, as well as healthy ways to communicate with others.  Participation Level: Did Not Attend  Participation Quality:  Not Applicable  Affect:  Not Applicable  Cognitive:  Not Applicable  Insight:  Not Applicable  Engagement in Group: Not Applicable  Additional Comments:    Romie Minus 12/26/2013, 10:07 AM

## 2013-12-26 NOTE — BHH Group Notes (Signed)
[place] Group Notes:  (Clinical Social Work)  12/26/2013     10-11AM  Summary of Progress/Problems:   The main focus of today's process group was for the patient to identify ways in which they have in the past sabotaged their own recovery. Motivational Interviewing and a worksheet were utilized to help patients explore the perceived benefits and costs of their substance use, as well as the potential benefits and costs of stopping.  The Stages of Change were explained using a handout, and patients identified where they currently are with regard to stages of change.  The patient expressed that if the use of substances were stopped, he would expect to feel "horrible physically with all my dental and physical pain."  He purposefully tried to listen more and talk less at this session than he remembers doing before, due to wanting help.  Type of Therapy:  Group Therapy - Process   Participation Level:  Active  Participation Quality:  Attentive, Sharing and Supportive  Affect:  Appropriate  Cognitive:  Appropriate and Oriented  Insight:  Engaged  Engagement in Therapy:  Engaged  Modes of Intervention:  Education, Psychiatric nurse, Motivational Interviewing  Selmer Dominion, LCSW 12/26/2013, 1:14 PM

## 2013-12-26 NOTE — Progress Notes (Signed)
[place] Group Notes:  (Nursing/MHT/Case Management/Adjunct)  Date:  12/26/2013  Time:  2100 Type of Therapy:  wrap up group  Participation Level:  Active  Participation Quality:  Appropriate, Attentive, Monopolizing, Redirectable, Sharing and Supportive  Affect:  Appropriate  Cognitive:  Appropriate  Insight:  Lacking  Engagement in Group:  Engaged  Modes of Intervention:  Clarification, Education and Support  Summary of Progress/Problems: Pt reported getting good sleep today. Pt shares that he suffers from insomnia, anxiety, and night terrors. Pt is concerned about becoming dependent on sleep medication although pt reports his insomnia as one of his triggers. Pt also reports pain being a big trigger for his drinking. Pt shares that he is starting to have strong cravings and warns that he will be getting very grumpy. Pt reports having three main supports, his son, best friend, and best friends mother.    Jacques Navy 12/26/2013, 11:41 PM

## 2013-12-26 NOTE — Progress Notes (Signed)
D:pt attended [place] group. Pt stated he has been having off and on SI, but verbally contracts. Pt stated he is having mild tremors and some anxiety. Denies any pain. Pt is calm and cooperative. Appropriate on approach A: q 15 min safety checks. Medications given, pt compliant. Support and encouragement R: pt remains safe on unit

## 2013-12-26 NOTE — Progress Notes (Signed)
[facility] MD Progress Note  12/26/2013 5:14 PM Jonathan Lin  MRN:  [id number] Subjective:   Patient states "I have having cravings to drink alcohol. I am feeling depressed and having anxiety attacks."  Objective:  The patient is visible on the unit but is minimally interactive with peers. He has been guarded during interactions with staff today saying little about his reasons for admission. Rates his depression at eight and hopelessness at six. Reporting symptoms of depression to include fair sleep, low energy and concentration that is slowly improving. Prior to admission the patient was drinking to the point of blacking out. Patient appears depressed with very blunted affect. His UDS was positive for marijuana.   Diagnosis:   DSM5: Schizophrenia Disorders: None  Obsessive-Compulsive Disorders: None  Trauma-Stressor Disorders: none  Substance/Addictive Disorders: Alcohol Related Disorder - Severe (303.90)  Depressive Disorders: Major Depressive Disorder - Moderate (296.22)  AXIS I: ADHD, combined type and Substance Induced Mood Disorder  AXIS II: No diagnosis  AXIS III:  Past Medical History   Diagnosis  Date   .  Back pain    .  Depression    .  Alcoholism    .  Tumor cells, benign  12/24/2013     Back, with related pain and numbness.    AXIS IV: other psychosocial or environmental problems  AXIS V: 41-50 serious symptoms   ADL's:  Intact  Sleep: Fair  Appetite:  Good  Suicidal Ideation:  Passive SI no plan  Homicidal Ideation:  Denies  AEB (as evidenced by):  Psychiatric Specialty Exam: Physical Exam  Review of Systems  Constitutional: Negative.   HENT: Negative.   Eyes: Negative.   Respiratory: Negative.   Cardiovascular: Negative.   Gastrointestinal: Negative.   Genitourinary: Negative.   Musculoskeletal: Negative.   Skin: Negative.   Neurological: Positive for tremors.  Endo/Heme/Allergies: Negative.   Psychiatric/Behavioral: Positive for depression, suicidal  ideas and substance abuse. Negative for hallucinations and memory loss. The patient is nervous/anxious and has insomnia.     Blood pressure 130/85, pulse 88, temperature 97.7 F (36.5 C), temperature source Oral, resp. rate 18, height 5' 11"  (1.803 m), weight 78.472 kg (173 lb).Body mass index is 24.14 kg/(m^2).  General Appearance: Fairly Groomed  Engineer, water::  Fair  Speech:  Clear and Coherent  Volume:  Normal  Mood:  Anxious and Depressed  Affect:  Flat  Thought Process:  Coherent and Goal Directed  Orientation:  Full (Time, Place, and Person)  Thought Content:  Symptoms, worries   Suicidal Thoughts:  Yes.  without intent/plan  Homicidal Thoughts:  No  Memory:  Immediate;   Fair Recent;   Fair Remote;   Fair  Judgement:  Fair  Insight:  Present  Psychomotor Activity:  Restlessness  Concentration:  Fair  Recall:  [employer] of Knowledge:Fair  Language: Fair  Akathisia:  No  Handed:  Right  AIMS (if indicated):     Assets:  Communication Skills Desire for Improvement Social Support  Sleep:  Number of Hours: 6.5   Musculoskeletal: Strength & Muscle Tone: within normal limits Gait & Station: normal Patient leans: N/A  Current Medications: Current Facility-Administered Medications  Medication Dose Route Frequency Provider Last Rate Last Dose  . acetaminophen (TYLENOL) tablet 650 mg  650 mg Oral Q6H PRN Lurena Nida, NP   650 mg at 12/25/13 0108  . alum & mag hydroxide-simeth (MAALOX/MYLANTA) 200-200-20 MG/5ML suspension 30 mL  30 mL Oral Q4H PRN Lurena Nida, NP      .  chlordiazePOXIDE (LIBRIUM) capsule 25 mg  25 mg Oral Q6H PRN Lurena Nida, NP      . chlordiazePOXIDE (LIBRIUM) capsule 25 mg  25 mg Oral TID Lurena Nida, NP       Followed by  . [START ON 12/27/2013] chlordiazePOXIDE (LIBRIUM) capsule 25 mg  25 mg Oral BH-qamhs Lurena Nida, NP       Followed by  . [START ON 12/29/2013] chlordiazePOXIDE (LIBRIUM) capsule 25 mg  25 mg Oral Daily Lurena Nida, NP       . chlordiazePOXIDE (LIBRIUM) capsule 50 mg  50 mg Oral Once Lurena Nida, NP      . hydrOXYzine (ATARAX/VISTARIL) tablet 25 mg  25 mg Oral Q6H PRN Lurena Nida, NP   25 mg at 12/26/13 1131  . loperamide (IMODIUM) capsule 2-4 mg  2-4 mg Oral PRN Lurena Nida, NP      . magnesium hydroxide (MILK OF MAGNESIA) suspension 30 mL  30 mL Oral Daily PRN Lurena Nida, NP      . multivitamin with minerals tablet 1 tablet  1 tablet Oral Daily Lurena Nida, NP   1 tablet at 12/26/13 2683  . nicotine (NICODERM CQ - dosed in mg/24 hours) patch 21 mg  21 mg Transdermal Daily Nicholaus Bloom, MD   21 mg at 12/26/13 4196  . ondansetron (ZOFRAN-ODT) disintegrating tablet 4 mg  4 mg Oral Q6H PRN Lurena Nida, NP      . thiamine (B-1) injection 100 mg  100 mg Intramuscular Once Lurena Nida, NP      . thiamine (VITAMIN B-1) tablet 100 mg  100 mg Oral Daily Lurena Nida, NP   100 mg at 12/26/13 2229  . traZODone (DESYREL) tablet 50 mg  50 mg Oral QHS PRN Lurena Nida, NP   50 mg at 12/25/13 2131    Lab Results:  Results for orders placed during the hospital encounter of 12/24/13 (from the past 48 hour(s))  ACETAMINOPHEN LEVEL     Status: None   Collection Time    12/24/13  5:44 PM      Result Value Ref Range   Acetaminophen (Tylenol), Serum <15.0  10 - 30 ug/mL   Comment:            THERAPEUTIC CONCENTRATIONS VARY     SIGNIFICANTLY. A RANGE OF 10-30     ug/mL MAY BE AN EFFECTIVE     CONCENTRATION FOR MANY PATIENTS.     HOWEVER, SOME ARE BEST TREATED     AT CONCENTRATIONS OUTSIDE THIS     RANGE.     ACETAMINOPHEN CONCENTRATIONS     >150 ug/mL AT 4 HOURS AFTER     INGESTION AND >50 ug/mL AT 12     HOURS AFTER INGESTION ARE     OFTEN ASSOCIATED WITH TOXIC     REACTIONS.  CBC     Status: None   Collection Time    12/24/13  5:44 PM      Result Value Ref Range   WBC 6.0  4.0 - 10.5 K/uL   RBC 5.01  4.22 - 5.81 MIL/uL   Hemoglobin 15.3  13.0 - 17.0 g/dL   HCT 44.8  39.0 - 52.0 %   MCV 89.4  78.0  - 100.0 fL   MCH 30.5  26.0 - 34.0 pg   MCHC 34.2  30.0 - 36.0 g/dL   RDW 12.7  11.5 - 15.5 %  Platelets 219  150 - 400 K/uL  COMPREHENSIVE METABOLIC PANEL     Status: Abnormal   Collection Time    12/24/13  5:44 PM      Result Value Ref Range   Sodium 139  137 - 147 mEq/L   Potassium 4.1  3.7 - 5.3 mEq/L   Chloride 102  96 - 112 mEq/L   CO2 23  19 - 32 mEq/L   Glucose, Bld 101 (*) 70 - 99 mg/dL   BUN 13  6 - 23 mg/dL   Creatinine, Ser 0.66  0.50 - 1.35 mg/dL   Calcium 9.6  8.4 - 10.5 mg/dL   Total Protein 7.8  6.0 - 8.3 g/dL   Albumin 4.3  3.5 - 5.2 g/dL   AST 21  0 - 37 U/L   ALT 24  0 - 53 U/L   Alkaline Phosphatase 56  39 - 117 U/L   Total Bilirubin 0.5  0.3 - 1.2 mg/dL   GFR calc non Af Amer >90  >90 mL/min   GFR calc Af Amer >90  >90 mL/min   Comment: (NOTE)     The eGFR has been calculated using the CKD EPI equation.     This calculation has not been validated in all clinical situations.     eGFR's persistently <90 mL/min signify possible Chronic Kidney     Disease.  ETHANOL     Status: None   Collection Time    12/24/13  5:44 PM      Result Value Ref Range   Alcohol, Ethyl (B) <11  0 - 11 mg/dL   Comment:            LOWEST DETECTABLE LIMIT FOR     SERUM ALCOHOL IS 11 mg/dL     FOR MEDICAL PURPOSES ONLY  SALICYLATE LEVEL     Status: Abnormal   Collection Time    12/24/13  5:44 PM      Result Value Ref Range   Salicylate Lvl <7.5 (*) 2.8 - 20.0 mg/dL  URINE RAPID DRUG SCREEN (HOSP PERFORMED)     Status: Abnormal   Collection Time    12/24/13 11:38 PM      Result Value Ref Range   Opiates NONE DETECTED  NONE DETECTED   Cocaine NONE DETECTED  NONE DETECTED   Benzodiazepines NONE DETECTED  NONE DETECTED   Amphetamines NONE DETECTED  NONE DETECTED   Tetrahydrocannabinol POSITIVE (*) NONE DETECTED   Barbiturates NONE DETECTED  NONE DETECTED   Comment:            DRUG SCREEN FOR MEDICAL PURPOSES     ONLY.  IF CONFIRMATION IS NEEDED     FOR ANY PURPOSE, NOTIFY  LAB     WITHIN 5 DAYS.                LOWEST DETECTABLE LIMITS     FOR URINE DRUG SCREEN     Drug Class       Cutoff (ng/mL)     Amphetamine      1000     Barbiturate      200     Benzodiazepine   643     Tricyclics       329     Opiates          300     Cocaine          300     THC  50    Physical Findings: AIMS: Facial and Oral Movements Muscles of Facial Expression: None, normal Lips and Perioral Area: None, normal Jaw: None, normal Tongue: None, normal,Extremity Movements Upper (arms, wrists, hands, fingers): None, normal Lower (legs, knees, ankles, toes): None, normal, Trunk Movements Neck, shoulders, hips: None, normal, Overall Severity Severity of abnormal movements (highest score from questions above): None, normal Incapacitation due to abnormal movements: None, normal Patient's awareness of abnormal movements (rate only patient's report): No Awareness, Dental Status Current problems with teeth and/or dentures?: Yes Does patient usually wear dentures?: No  CIWA:  CIWA-Ar Total: 0 COWS:     Treatment Plan Summary: Daily contact with patient to assess and evaluate symptoms and progress in treatment Medication management  Plan: Continue crisis management and stabilization.  Medication management: Continue librium detox protocol for alcohol abuse. Trazodone 50 mg hs prn insomnia.  Encouraged patient to attend groups and participate in group counseling sessions and activities.  Discharge plan in progress. Patient has been referred to [facility] for substance abuse treatment.  Continue current treatment plan.  Address health issues: Vitals reviewed and stable.   Medical Decision Making Problem Points:  Established problem, stable/improving (1), Review of last therapy session (1) and Review of psycho-social stressors (1) Data Points:  Review of medication regiment & side effects (2)  I certify that inpatient services furnished can reasonably be expected to  improve the patient's condition.   Elmarie Shiley NP-C 12/26/2013, 5:14 PM  Patient seen, evaluated and I agree with notes by Nurse Practitioner. Corena Pilgrim, MD

## 2013-12-27 MED ORDER — IBUPROFEN 600 MG PO TABS
600.0000 mg | ORAL_TABLET | Freq: Four times a day (QID) | ORAL | Status: DC | PRN
  Administered 2013-12-27: 600 mg via ORAL
  Filled 2013-12-27: qty 1

## 2013-12-27 NOTE — BHH Group Notes (Signed)
[place] Group Notes:  (Clinical Social Work)  12/27/2013  10:00-11:00AM  Summary of Progress/Problems:   The main focus of today's process group was to   identify the patient's current support system and decide on other supports that can be put in place.  [name] was also an extensive discussion about what constitutes a healthy support versus an unhealthy support.  The patient expressed full comprehension of the concepts presented, and agreed that there is a need to add more supports.  The patient stated the current supports in place are his girlfriend, his best friend, and his family.  CSW did allow him to use laptop computer at her side during group register for healthcare insurance since this is the last day it is possible to do that.  He was not interactive with the group during that time.  Type of Therapy:  Process Group with Motivational Interviewing  Participation Level:  Minimal  Participation Quality:  Attentive  Affect:  Blunted  Cognitive:  Appropriate  Insight:  Developing/Improving  Engagement in Therapy:  Engaged  Modes of Intervention:   Education, Support and Processing, Activity  [employer], LCSW 12/27/2013, 12:15pm

## 2013-12-27 NOTE — Progress Notes (Signed)
D: pt stated he has been struggling with cravings throughout the day. Pt stated he hasn't really been coping with it, he just has no choice put to deal with it while on the unit. Passive si, but contracts. Rates pain 6/10 in his lower back and legs. Tylenol given to help relieve pain. Pt is calm and cooperative. No complaints of withdrawal symptoms A: q 15 min safety checks. Support and encouragement given R: pt remains safe on unit.

## 2013-12-27 NOTE — Progress Notes (Signed)
[place] Group Notes:  (Nursing/MHT/Case Management/Adjunct)  Date:  12/27/2013  Time:  6:28 PM  Type of Therapy:  Psychoeducational Skills  Participation Level:  Active  Participation Quality:  Appropriate and Attentive  Affect:  Appropriate  Cognitive:  Appropriate  Insight:  Appropriate  Engagement in Group:  Engaged and Supportive  Modes of Intervention:  Activity  Summary of Progress/Problems: Pts played an activity of Pictionary using coping skills. Pts stated one coping skill they have learned while they have been in the hospital that they will use outside of the hospital.  Pt stated one coping skill he will use is making music.  Clint Bolder 12/27/2013, 6:28 PM

## 2013-12-27 NOTE — B[facility] Group Notes (Signed)
[place] Group Notes:  (Nursing/MHT/Case Management/Adjunct)  Date:  12/27/2013  Time:  2:08 PM  Type of Therapy:  Nurse Education  Participation Level:  Active  Participation Quality:  Appropriate and Attentive  Affect:  Appropriate  Cognitive:  Alert, Appropriate and Oriented  Insight:  Appropriate and Good  Engagement in Group:  Engaged  Modes of Intervention:  Discussion and Education  Summary of Progress/Problems: Video on "Dedicating Yourself To Sobriety". Discussion.    Romie Minus 12/27/2013, 2:08 PM

## 2013-12-27 NOTE — Progress Notes (Signed)
D. Pt has been up and has been visible in milieu as the day has progressed. Pt spoke about having some increased withdrawal symptoms and has had feelings of agitation today and that he is also having cravings for alcohol and also reports feelings of depression. Pt has received all medications today without incident. A. Support and encouragement provided, medication education given. R. Pt verbalized understanding, safety maintained.

## 2013-12-27 NOTE — Progress Notes (Signed)
Patient ID: Jonathan Lin, male   DOB: [birth year], 38 y.o.   MRN: [id number] [facility] MD Progress Note  12/27/2013 4:29 PM [name]  MRN:  [id number] Subjective:   Patient states "I am having withdrawal symptoms. Also have been feeling agitated and anxious. I also have depression pretty bad and have been wanting to drink alcohol. I want to get through detox so the Doctor can determine my baseline. I'm not sure if I'm Bipolar or not. I guess it's hard to know because of my substance abuse."  Objective:  Patient is more active on the unit today. He is less guarded and more willing to share information with Provider. Jabri stopped his prior psychiatric medications of Abilify/Lamictal due to complaints of feeling too numb. He reports wanting to find out what his true diagnosis is once he is detoxed. Patient reports that his girlfriend who is a psychiatric nurse has been him advice about what medication to take. Patient appears to have poor judgment about his mental health care and has abused alcohol to the point of blacking out. The patient had been drinking up to a fifth a day prior to admission. Patient appears hyper, anxious and restless today during his assessment.   Diagnosis:   DSM5: Schizophrenia Disorders: None  Obsessive-Compulsive Disorders: None  Trauma-Stressor Disorders: none  Substance/Addictive Disorders: Alcohol Related Disorder - Severe (303.90)  Depressive Disorders: Major Depressive Disorder - Moderate (296.22)  AXIS I: ADHD, combined type and Substance Induced Mood Disorder  AXIS II: No diagnosis  AXIS III:  Past Medical History   Diagnosis  Date   .  Back pain    .  Depression    .  Alcoholism    .  Tumor cells, benign  12/24/2013     Back, with related pain and numbness.    AXIS IV: other psychosocial or environmental problems  AXIS V: 41-50 serious symptoms   ADL's:  Intact  Sleep: Fair  Appetite:  Good  Suicidal Ideation:  Passive SI no plan  Homicidal Ideation:   Denies  AEB (as evidenced by):  Psychiatric Specialty Exam: Physical Exam  Psychiatric: His mood appears anxious. His speech is rapid and/or pressured. He is agitated.    Review of Systems  Constitutional: Negative.   HENT: Negative.   Eyes: Negative.   Respiratory: Negative.   Cardiovascular: Negative.   Gastrointestinal: Negative.   Genitourinary: Negative.   Musculoskeletal: Negative.   Skin: Negative.   Neurological: Positive for tremors.  Endo/Heme/Allergies: Negative.   Psychiatric/Behavioral: Positive for depression, suicidal ideas and substance abuse. Negative for hallucinations and memory loss. The patient is nervous/anxious and has insomnia.     Blood pressure 116/81, pulse 89, temperature 97.9 F (36.6 C), temperature source Oral, resp. rate 16, height 5\' 11"  (1.803 m), weight 78.472 kg (173 lb).Body mass index is 24.14 kg/(m^2).  General Appearance: Fairly Groomed  Engineer, water::  Fair  Speech:  Clear and Coherent  Volume:  Normal  Mood:  Anxious and Depressed  Affect:  Flat  Thought Process:  Coherent and Goal Directed  Orientation:  Full (Time, Place, and Person)  Thought Content:  Symptoms, worries   Suicidal Thoughts:  Yes.  without intent/plan  Homicidal Thoughts:  No  Memory:  Immediate;   Fair Recent;   Fair Remote;   Fair  Judgement:  Impaired  Insight:  Present  Psychomotor Activity:  Restlessness  Concentration:  Fair  Recall:  [place]  Language: Fair  Akathisia:  No  Handed:  Right  AIMS (if indicated):     Assets:  Communication Skills Desire for Improvement Social Support  Sleep:  Number of Hours: 5.25   Musculoskeletal: Strength & Muscle Tone: within normal limits Gait & Station: normal Patient leans: N/A  Current Medications: Current Facility-Administered Medications  Medication Dose Route Frequency Provider Last Rate Last Dose  . acetaminophen (TYLENOL) tablet 650 mg  650 mg Oral Q6H PRN Lurena Nida, NP    650 mg at 12/27/13 0847  . alum & mag hydroxide-simeth (MAALOX/MYLANTA) 200-200-20 MG/5ML suspension 30 mL  30 mL Oral Q4H PRN Lurena Nida, NP      . chlordiazePOXIDE (LIBRIUM) capsule 25 mg  25 mg Oral Q6H PRN Lurena Nida, NP   25 mg at 12/26/13 2217  . chlordiazePOXIDE (LIBRIUM) capsule 25 mg  25 mg Oral BH-qamhs Lurena Nida, NP       Followed by  . [START ON 12/29/2013] chlordiazePOXIDE (LIBRIUM) capsule 25 mg  25 mg Oral Daily Lurena Nida, NP      . chlordiazePOXIDE (LIBRIUM) capsule 50 mg  50 mg Oral Once Lurena Nida, NP      . hydrOXYzine (ATARAX/VISTARIL) tablet 25 mg  25 mg Oral Q6H PRN Lurena Nida, NP   25 mg at 12/27/13 1440  . loperamide (IMODIUM) capsule 2-4 mg  2-4 mg Oral PRN Lurena Nida, NP      . magnesium hydroxide (MILK OF MAGNESIA) suspension 30 mL  30 mL Oral Daily PRN Lurena Nida, NP      . multivitamin with minerals tablet 1 tablet  1 tablet Oral Daily Lurena Nida, NP   1 tablet at 12/27/13 0848  . nicotine (NICODERM CQ - dosed in mg/24 hours) patch 21 mg  21 mg Transdermal Daily Nicholaus Bloom, MD   21 mg at 12/27/13 0849  . ondansetron (ZOFRAN-ODT) disintegrating tablet 4 mg  4 mg Oral Q6H PRN Lurena Nida, NP      . thiamine (B-1) injection 100 mg  100 mg Intramuscular Once Lurena Nida, NP      . thiamine (VITAMIN B-1) tablet 100 mg  100 mg Oral Daily Lurena Nida, NP   100 mg at 12/27/13 0848  . traZODone (DESYREL) tablet 50 mg  50 mg Oral QHS PRN Lurena Nida, NP   50 mg at 12/26/13 2217    Lab Results:  No results found for this or any previous visit (from the past 48 hour(s)).  Physical Findings: AIMS: Facial and Oral Movements Muscles of Facial Expression: None, normal Lips and Perioral Area: None, normal Jaw: None, normal Tongue: None, normal,Extremity Movements Upper (arms, wrists, hands, fingers): None, normal Lower (legs, knees, ankles, toes): None, normal, Trunk Movements Neck, shoulders, hips: None, normal, Overall  Severity Severity of abnormal movements (highest score from questions above): None, normal Incapacitation due to abnormal movements: None, normal Patient's awareness of abnormal movements (rate only patient's report): No Awareness, Dental Status Current problems with teeth and/or dentures?: Yes Does patient usually wear dentures?: No  CIWA:  CIWA-Ar Total: 2 COWS:     Treatment Plan Summary: Daily contact with patient to assess and evaluate symptoms and progress in treatment Medication management  Plan: Continue crisis management and stabilization.  Medication management: Continue librium detox protocol for alcohol abuse. Trazodone 50 mg hs prn insomnia.  Encouraged patient to attend groups and participate in group counseling sessions and activities.  Discharge plan in progress. Patient  has been referred to [facility] for substance abuse treatment.  Continue current treatment plan. Patient wishes to discuss psychotropic treatments with Dr. Sabra Heck tomorrow.  Address health issues: Vitals reviewed and stable.   Medical Decision Making Problem Points:  Established problem, stable/improving (1), Review of last therapy session (1) and Review of psycho-social stressors (1) Data Points:  Review of medication regiment & side effects (2)  I certify that inpatient services furnished can reasonably be expected to improve the patient's condition.   Elmarie Shiley NP-C 12/27/2013, 4:29 PM   Patient seen, evaluated and I agree with notes by Nurse Practitioner. Corena Pilgrim, MD

## 2013-12-27 NOTE — BHH Group Notes (Signed)
[place] Group Notes:  (Nursing/MHT/Case Management/Adjunct)  Psychoeducational Group Note  Date:  12/27/2013 Time:  0900  Group Topic/Focus:  Relapse Prevention Planning:   The focus of this group is to define relapse and discuss the need for planning to combat relapse.  Participation Level: Did Not Attend  Participation Quality:  Not Applicable  Affect:  Not Applicable  Cognitive:  Not Applicable  Insight:  Not Applicable  Engagement in Group: Not Applicable  Additional Comments:    Romie Minus 12/27/2013, 9:29 AM

## 2013-12-28 MED ORDER — HYDROXYZINE HCL 25 MG PO TABS
25.0000 mg | ORAL_TABLET | Freq: Once | ORAL | Status: AC
  Administered 2013-12-28: 25 mg via ORAL
  Filled 2013-12-28: qty 1

## 2013-12-28 MED ORDER — HYDROXYZINE HCL 25 MG PO TABS: ORAL_TABLET | ORAL | Status: DC

## 2013-12-28 MED ORDER — HYDROXYZINE HCL 25 MG PO TABS
ORAL_TABLET | ORAL | Status: AC
  Filled 2013-12-28: qty 1

## 2013-12-28 MED ORDER — TRAZODONE HCL 50 MG PO TABS: 50.0000 mg | ORAL_TABLET | Freq: Every evening | ORAL | Status: DC | PRN

## 2013-12-28 NOTE — Progress Notes (Signed)
[facility] Adult Case Management Discharge Plan :  Will you be returning to the same living situation after discharge: Yes,  returning home At discharge, do you have transportation home?:Yes,  family will pick pt up Do you have the ability to pay for your medications:Yes,  provided pt with samples and prescriptions and referred pt to [facility] for assistance with affording meds  Release of information consent forms completed and in the chart;  Patient's signature needed at discharge.  Patient to Follow up at: Follow-up Information   Follow up with [facility] On 12/31/2013. (Arrive by 8am with [facility] ID, meds, and clothing for admission. )    Contact information:   [address] [place], [place] [address] Phone: [phone] Fax: [phone]      Follow up with [facility]. (Walk in between 8am-9am Monday through Friday for hospital followup/medication management/assessment for therapy services. )    Contact information:   [address], [place] [address] Phone: [phone] Fax: [phone]      Patient denies SI/HI:   Yes,  denies SI/HI    Safety Planning and Suicide Prevention discussed:  Yes,  discussed with pt and pt's girlfriend.  See suicide prevention education note.   Ane Payment 12/28/2013, 10:41 AM

## 2013-12-28 NOTE — [facility] Suicide Risk Assessment (Signed)
Suicide Risk Assessment  Discharge Assessment     Demographic Factors:  Male and Caucasian  Total Time spent with patient: 45 minutes  Psychiatric Specialty Exam:     Blood pressure 124/74, pulse 86, temperature 97.3 F (36.3 C), temperature source Oral, resp. rate 16, height 5\' 11"  (1.803 m), weight 78.472 kg (173 lb).Body mass index is 24.14 kg/(m^2).  General Appearance: Fairly Groomed  Engineer, water::  Fair  Speech:  Clear and Coherent  Volume:  Normal  Mood:  Depressed  Affect:  Tearful and sad  Thought Process:  Coherent and Goal Directed  Orientation:  Full (Time, Place, and Person)  Thought Content:  processing the death o fhis cousin who died last night, wants to be there for his family  Suicidal Thoughts:  No  Homicidal Thoughts:  No  Memory:  Immediate;   Fair Recent;   Fair Remote;   Fair  Judgement:  Fair  Insight:  Present  Psychomotor Activity:  Restlessness  Concentration:  Fair  Recall:  [employer] [place]: Fair  Akathisia:  No  Handed:    AIMS (if indicated):     Assets:  Desire for Improvement Social Support Talents/Skills  Sleep:  Number of Hours: 4    Musculoskeletal: Strength & Muscle Tone: within normal limits Gait & Station: normal Patient leans: N/A   Mental Status Per Nursing Assessment::   On Admission:  Suicidal ideation indicated by patient;Suicide plan;Self-harm thoughts  Current Mental Status by Physician: In full contact with reality. No active S/S of withdrawal. Has a relapse prevention plan in place   Loss Factors: Loss of significant relationship  Historical Factors: NA  Risk Reduction Factors:   Sense of responsibility to family  Continued Clinical Symptoms:  Alcohol/Substance Abuse/Dependencies  Cognitive Features That Contribute To Risk:  Polarized thinking Thought constriction (tunnel vision)    Suicide Risk:  Minimal: No identifiable suicidal ideation.  Patients presenting with no risk  factors but with morbid ruminations; may be classified as minimal risk based on the severity of the depressive symptoms  Discharge Diagnoses:   AXIS I:  Alcohol Dependence, ADHD, Mood Disorder NOS, MDD AXIS II:  Deferred AXIS III:   Past Medical History  Diagnosis Date  . Back pain   . Depression   . Alcoholism   . 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   AXIS IV:  other psychosocial or environmental problems AXIS V:  61-70 mild symptoms  Plan Of Care/Follow-up recommendations:  Activity:  as tolerated Diet:  regular Follow up [facility] Is patient on multiple antipsychotic therapies at discharge:  No   Has Patient had three or more failed trials of antipsychotic monotherapy by history:  No  Recommended Plan for Multiple Antipsychotic Therapies: NA    Terris Bodin A 12/28/2013, 10:07 AM

## 2013-12-28 NOTE — Progress Notes (Signed)
Patient ID: Jonathan Lin, male   DOB: [birth year], 38 y.o.   MRN: [id number]  D: Patient upset this am due to finding out his cousin passed away yesterday. Patient wanting to be with his family. Will follow up at [facility] inpatient on Thursday but reports he will come back here if he needs to. No SI at present A: Patient obtained all belongings, prescriptions, med samples, and follow-up appointment. R: Left in own vehicle.

## 2013-12-28 NOTE — [facility] Group Notes (Signed)
[facility] LCSW Aftercare Discharge Planning Group Note   12/28/2013  8:45 AM  Participation Quality:  Did Not Attend - pt upset about finding out his cousin passed away  Regan Lemming, [facility] 12/28/2013 9:49 AM

## 2013-12-28 NOTE — Discharge Summary (Signed)
Physician Discharge Summary Note  Patient:  Jonathan Lin is an 38 y.o., male MRN:  [id number] DOB:  January 08, 1976 Patient phone:  [phone] (home)  Patient address:   [place] [address],  Total Time spent with patient: Greater than 30 minutes  Date of Admission:  12/25/2013  Date of Discharge: 12/28/13  Reason for Admission:  Alcohol detox  Discharge Diagnoses: Active Problems:   Alcohol dependence   Psychiatric Specialty Exam: Physical Exam  Constitutional: He is oriented to person, place, and time. He appears well-developed.  HENT:  Head: Normocephalic.  Eyes: Pupils are equal, round, and reactive to light.  Neck: Normal range of motion.  Cardiovascular: Normal rate.   Respiratory: Effort normal.  GI: Soft.  Genitourinary:  Denies any issues in this area  Musculoskeletal: Normal range of motion.  Neurological: He is alert and oriented to person, place, and time.  Skin: Skin is warm and dry.  Psychiatric: His speech is normal and behavior is normal. Judgment and thought content normal. Anxious: Stable. His affect is not blunt, not labile and not inappropriate. Angry: Upset after learning of his cousin's death. Cognition and memory are normal. Depressed: Stable.    Review of Systems  Constitutional: Negative.   HENT: Negative.   Eyes: Negative.   Respiratory: Negative.   Cardiovascular: Negative.   Gastrointestinal: Negative.   Genitourinary: Negative.   Musculoskeletal: Negative.   Skin: Negative.   Neurological: Negative.   Endo/Heme/Allergies: Negative.   Psychiatric/Behavioral: Positive for substance abuse (Alcoholism, chronic). Negative for depression, suicidal ideas, hallucinations and memory loss. The patient has insomnia (Stable).     Blood pressure 124/74, pulse 86, temperature 97.3 F (36.3 C), temperature source Oral, resp. rate 16, height 5\' 11"  (1.803 m), weight 78.472 kg (173 lb).Body mass index is 24.14 kg/(m^2).  General  Appearance: Casual  Eye Contact::  Good  Speech:  Clear and Coherent  Volume:  Normal  Mood:  Stable however, upset learning about cousin's death  Affect:  Teary due to learning of cousin's death  Thought Process:  Coherent and Goal Directed  Orientation:  Full (Time, Place, and Person)  Thought Content:  Denies any hallucinations  Suicidal Thoughts:  No  Homicidal Thoughts:  No  Memory:  Immediate;   Good Recent;   Good Remote;   Good  Judgement:  Good  Insight:  Present  Psychomotor Activity:  Normal  Concentration:  Good  Recall:  Good  Fund of Knowledge:Good  Language: Good  Akathisia:  No  Handed:  Right  AIMS (if indicated):     Assets:  Desire for Improvement  Sleep:  Number of Hours: 4    Past Psychiatric History: Diagnosis:  Hospitalizations:  Outpatient Care:  Substance Abuse Care:  Self-Mutilation:  Suicidal Attempts:  Violent Behaviors:   Musculoskeletal: Strength & Muscle Tone: within normal limits Gait & Station: normal Patient leans: N/A  DSM5:  Schizophrenia Disorders:  NA Obsessive-Compulsive Disorders:  NA Trauma-Stressor Disorders:  NA Substance/Addictive Disorders:  Alcohol Related Disorder - Severe (303.90) Depressive Disorders:  NA  Axis Diagnosis:   AXIS I:  Alcohol Related Disorder - Severe (303.90) AXIS II:  Deferred AXIS III:   Past Medical History  Diagnosis Date  . Back pain   . Depression   . Alcoholism   . 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   AXIS IV:  Alcoholism, chronic AXIS V:  63  Level of Care:  RTC, OP  Hospital Course:  38 Y/o male who has a past admission to our unit and D/C Dec 23. After D/C he abstained for 2 weeks. States that the medications were making him "zombified." He weaned off them. He has been drinking a pint daily to every other day, up to a fith a day. Could stop for 24-36 hours but started drinking again. More recently he had an episode in which he was drinking  by himself in front of the computer. He went out to a club two blocks down the street. Does not remember anything. He drove. 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  While a patient this hospital, Yuvan received Lib[facility] detoxification treatment protocols for alcohol intoxication. He did not resume his previous medications that he was discharged on last December for mood stabilization as he complained that they made him zombified.  However, he did receive Hydroxyzine 25 mg Q 6 hours prn for anxiety symptoms and Trazodone 50 mg Q bedtime for sleep. Mustaf was also enrolled in group counseling sessions/activties and AA/NA meetings being offered on this unit to learn coping skills that should help him cope better after discharge. He presented no other medical issues that required treatment and or monitoring. Gladys however, tolerated his treatment regimen without any significant adverse effects and or reactions reported.  Mr. Konopka has completed detox treatment. He also presented with stable mood. However, he was upset and teary this morning after learning the news of the death of his cousin. He was able to express his feelings in a more appropriate and self controlled manner. Jakyron is currently being discharged to start substance abuse treatment at the [facility] on 12/31/13 at 08:00 am. He also has [facility] referral in place. He was discharged on Hydroxyzine 25 mg and Trazodone 50 mg. And for medication management and routine psychiatric care, he will receive these services at the [facility] clinic here in [place], [place].  Upon discharge, he adamantly denies any suicidal, homicidal ideations, auditory, visual hallucinations, delusions, paranoia and or withdrawal symptoms. He received 2 weeks worth supply samples of  his [facility] discharge medications. He left [facility] with all personal belongings in no apparent distress. Transportation per family.  Consults:  psychiatry  Significant Diagnostic Studies:  labs: CBC with diff, CMP, UDS, toxicology tests, U/A, reports reviewed, stable  Discharge Vitals:   Blood pressure 124/74, pulse 86, temperature 97.3 F (36.3 C), temperature source Oral, resp. rate 16, height 5\' 11"  (1.803 m), weight 78.472 kg (173 lb). Body mass index is 24.14 kg/(m^2). Lab Results:   No results found for this or any previous visit (from the past 72 hour(s)).  Physical Findings: AIMS: Facial and Oral Movements Muscles of Facial Expression: None, normal Lips and Perioral Area: None, normal Jaw: None, normal Tongue: None, normal,Extremity Movements Upper (arms, wrists, hands, fingers): None, normal Lower (legs, knees, ankles, toes): None, normal, Trunk Movements Neck, shoulders, hips: None, normal, Overall Severity Severity of abnormal movements (highest score from questions above): None, normal Incapacitation due to abnormal movements: None, normal Patient's awareness of abnormal movements (rate only patient's report): No Awareness, Dental Status Current problems with teeth and/or dentures?: Yes Does patient usually wear dentures?: No  CIWA:  CIWA-Ar Total: 3 COWS:     Psychiatric Specialty Exam: See Psychiatric Specialty Exam and  Suicide Risk Assessment completed by Attending Physician prior to discharge.  Discharge destination:  Other:  Home, then to [place]  Is patient on multiple antipsychotic therapies at discharge:  No   Has Patient had three or more failed trials of antipsychotic monotherapy by history:  No  Recommended Plan for Multiple Antipsychotic Therapies: NA     Medication List    STOP taking these medications       ARIPiprazole 5 MG tablet  Commonly known as:  ABILIFY     lamoTRIgine 25 MG tablet  Commonly known as:  LAMICTAL      TAKE  these medications     Indication   hydrOXYzine 25 MG tablet  Commonly known as:  ATARAX/VISTARIL  Take 1 tablet (25 mg) three times daily as needed for anxiety   Indication:  Anxiety associated with Organic Disease, Tension     traZODone 50 MG tablet  Commonly known as:  DESYREL  Take 1 tablet (50 mg total) by mouth at bedtime as needed for sleep.   Indication:  Trouble Sleeping       Follow-up Information   Follow up with [facility] On 12/31/2013. (Arrive by 8am with [facility] ID, meds, and clothing for admission. )    Contact information:   [address] [place], [place] [address] Phone: [phone] Fax: [phone]      Follow up with [facility]. (Walk in between 8am-9am Monday through Friday for hospital followup/medication management/assessment for therapy services. )    Contact information:   [address], [place] [address] Phone: [phone] Fax: [phone]      Follow up with [facility]. (referral sent: [facility] reviewing pt information.)    Contact information:   [address] [place],  [address] Phone: [phone] Fax: [phone]     Follow-up recommendations:  Activity:  As tolerated Diet: As recommended by your primary care doctor. Keep all scheduled follow-up appointments as recommended. Continue to work your relapse prevention plan Comments:  Take all your medications as prescribed by your mental healthcare provider. Report any adverse effects and or reactions from your medicines to your outpatient provider promptly. Patient is instructed and cautioned to not engage in alcohol and or illegal drug use while on prescription medicines. In the event of worsening symptoms, patient is instructed to call the crisis hotline, 911 and or go to the nearest ED for appropriate evaluation and treatment of symptoms. Follow-up with your primary care provider for your other medical issues, concerns and or health care needs.   Total  Discharge Time:  Greater than 30 minutes. Signed: Encarnacion Slates, Irena, FNP 12/28/2013, 9:26 AM Agree with assessment and plan Woodroe Chen. Sabra Heck, M.D.

## 2013-12-28 NOTE — Tx Team (Signed)
Interdisciplinary Treatment Plan Update (Adult)  Date: 12/28/2013  Time Reviewed:  9:45 AM  Progress in Treatment: Attending groups: Yes Participating in groups:  Yes Taking medication as prescribed:  Yes Tolerating medication:  Yes Family/Significant othe contact made: Yes, with pt's girlfriend Patient understands diagnosis:  Yes Discussing patient identified problems/goals with staff:  Yes Medical problems stabilized or resolved:  Yes Denies suicidal/homicidal ideation: Yes Issues/concerns per patient self-inventory:  Yes Other:  New problem(s) identified: N/A  Discharge Plan or Barriers: Pt will follow up at [facility] for further inpatient treatment and [facility] for outpatient medication management and therapy.    Reason for Continuation of Hospitalization: Stable to d/c today  Comments: N/A  Estimated length of stay: D/C today  For review of initial/current patient goals, please see plan of care.  Attendees: Patient:     Family:     Physician:  Dr. Sabra Heck 12/28/2013 10:39 AM   Nursing:   Satira Sark, RN 12/28/2013 10:39 AM   Clinical Social Worker:  Regan Lemming, LCSW 12/28/2013 10:39 AM   Other: Lars Pinks, RN case manager 12/28/2013 10:39 AM   Other:  Maxie Better, [place] 12/28/2013 10:39 AM   Other:  Desma Paganini, RN 12/28/2013 10:41 AM   Other:  Maureen Chatters, RN 12/28/2013 10:41 AM   Other:    Other:    Other:    Other:    Other:    Other:     Scribe for Treatment Team:   Ane Payment, 12/28/2013 , 10:39 AM

## 2013-12-28 NOTE — Progress Notes (Signed)
D: pt c/o of lower back pain. Pt stated he went a little too hard during rec time this evening playing basketball. Pt rates pain 9/10. Pt is asking for a muscle relaxer or antiinflammatory medication. Pt stated he was experiencing some major withdrawal symptoms earlier this evening, maybe even some DT, pt stated his whole right side of his body was tremulous at one point and his anxiety level was through the roof. Pt stated that he felt as though his motor skill were a little off today. Passive si, able to contract for safety. Denies hi/avh A: NP made aware of pt situation. Advil 600mg  ordered. q 15 min safety. 1:1 time given R: pt remains safe on unit. Pt stated his back still hurting and waiting a muscle relaxer.

## 2013-12-31 NOTE — Progress Notes (Signed)
Patient Discharge Instructions:  After Visit Summary (AVS):   Faxed to:  12/31/13 Discharge Summary Note:   Faxed to:  12/31/13 Psychiatric Admission Assessment Note:   Faxed to:  12/31/13 Suicide Risk Assessment - Discharge Assessment:   Faxed to:  12/31/13 Faxed/Sent to the Next Level Care provider:  12/31/13 Faxed to [facility] @ [phone] Faxed to [facility] @ [place], 12/31/2013, 4:04 PM

## 2014-01-23 ENCOUNTER — Emergency Department (HOSPITAL_BASED_OUTPATIENT_CLINIC_OR_DEPARTMENT_OTHER): Payer: Worker's Compensation

## 2014-01-23 ENCOUNTER — Emergency Department (HOSPITAL_BASED_OUTPATIENT_CLINIC_OR_DEPARTMENT_OTHER)
Admission: EM | Admit: 2014-01-23 | Discharge: 2014-01-24 | Disposition: A | Discharge status: Home / Self Care | Payer: Worker's Compensation | Attending: Emergency Medicine | Admitting: Emergency Medicine

## 2014-01-23 ENCOUNTER — Encounter (HOSPITAL_BASED_OUTPATIENT_CLINIC_OR_DEPARTMENT_OTHER): Payer: Self-pay | Admitting: Emergency Medicine

## 2014-01-23 DIAGNOSIS — Z872 Personal history of diseases of the skin and subcutaneous tissue: Secondary | ICD-10-CM | POA: Insufficient documentation

## 2014-01-23 DIAGNOSIS — F17200 Nicotine dependence, unspecified, uncomplicated: Secondary | ICD-10-CM | POA: Insufficient documentation

## 2014-01-23 DIAGNOSIS — F3289 Other specified depressive episodes: Secondary | ICD-10-CM | POA: Insufficient documentation

## 2014-01-23 DIAGNOSIS — F329 Major depressive disorder, single episode, unspecified: Secondary | ICD-10-CM | POA: Insufficient documentation

## 2014-01-23 DIAGNOSIS — F1021 Alcohol dependence, in remission: Secondary | ICD-10-CM | POA: Insufficient documentation

## 2014-01-23 DIAGNOSIS — M7989 Other specified soft tissue disorders: Secondary | ICD-10-CM | POA: Insufficient documentation

## 2014-01-23 DIAGNOSIS — R42 Dizziness and giddiness: Secondary | ICD-10-CM | POA: Insufficient documentation

## 2014-01-23 DIAGNOSIS — M79609 Pain in unspecified limb: Secondary | ICD-10-CM | POA: Insufficient documentation

## 2014-01-23 DIAGNOSIS — G8929 Other chronic pain: Secondary | ICD-10-CM | POA: Insufficient documentation

## 2014-01-23 DIAGNOSIS — R209 Unspecified disturbances of skin sensation: Secondary | ICD-10-CM | POA: Insufficient documentation

## 2014-01-23 DIAGNOSIS — Z79899 Other long term (current) drug therapy: Secondary | ICD-10-CM | POA: Insufficient documentation

## 2014-01-23 LAB — COMPREHENSIVE METABOLIC PANEL
ALT: 71 U/L — ABNORMAL HIGH (ref 0–53)
AST: 37 U/L (ref 0–37)
Albumin: 3.7 g/dL (ref 3.5–5.2)
Alkaline Phosphatase: 50 U/L (ref 39–117)
BUN: 18 mg/dL (ref 6–23)
CO2: 24 mEq/L (ref 19–32)
Calcium: 9.5 mg/dL (ref 8.4–10.5)
Chloride: 102 mEq/L (ref 96–112)
Creatinine, Ser: 1 mg/dL (ref 0.50–1.35)
GFR calc Af Amer: >90 mL/min (ref >90)
GFR calc non Af Amer: >90 mL/min (ref >90)
Glucose, Bld: 96 mg/dL (ref 70–99)
Potassium: 4.4 mEq/L (ref 3.7–5.3)
Sodium: 140 mEq/L (ref 137–147)
Total Bilirubin: <0.2 mg/dL — ABNORMAL LOW (ref 0.3–1.2)
Total Protein: 7.3 g/dL (ref 6.0–8.3)

## 2014-01-23 LAB — CBC
HCT: 44.1 % (ref 39.0–52.0)
Hemoglobin: 14.9 g/dL (ref 13.0–17.0)
MCH: 30.7 pg (ref 26.0–34.0)
MCHC: 33.8 g/dL (ref 30.0–36.0)
MCV: 90.7 fL (ref 78.0–100.0)
Platelets: 168 10*3/uL (ref 150–400)
RBC: 4.86 MIL/uL (ref 4.22–5.81)
RDW: 12.6 % (ref 11.5–15.5)
WBC: 7.5 10*3/uL (ref 4.0–10.5)

## 2014-01-23 NOTE — ED Provider Notes (Addendum)
CSN: [id number]     Arrival date & time 01/23/14  1943 History  This chart was scribed for Jonathan Hamburger, MD by Mercy Moore, ED scribe.  This patient was seen in room MH12/MH12 and the patient's care was started at 10:15 PM.   Chief Complaint  Patient presents with  . Leg Pain/Swelling       HPI HPI Comments: Jonathan Lin is a 38 y.o. male who presents to the Emergency Department complaining of left leg pain and swelling. He first noticed the swelling today when someone brought it up. His pain has been chronic. Patient reports that today when getting out of the shower he noticed that is left lower leg was larger than the right and that his left breast was fuller than other. Patient reports chronic leg pain for two years which he describes as sharp pins and needles and tingling on the sides. Patient shares intinially with his leg pain that when squatting and then getting back up he would feel light headed (a symptom that has since resolved). His leg pain has since began to worsen eventually leading to the swelling that the patient noticed today.  Denies chest pain or shortness of breath. Earlier he felt like his left breast was more swollen than his right but when looking at them now he feels they are normal.  Patient is an admitted smoker and alcoholic, currently in treatment at [facility].   Past Medical History  Diagnosis Date  . Back pain   . Depression   . Alcoholism   . 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   Past Surgical History  Procedure Laterality Date  . Extraction of wisdom teeth    . Wisdom tooth extraction  1994   No family history on file. History  Substance Use Topics  . Smoking status: Current Every Day Smoker -- 0.50 packs/day    Types: Cigarettes  . Smokeless tobacco: Not on file  . Alcohol Use: No     Comment: In treatment at [facility]  Constitutional: Negative for fever.  Respiratory: Negative for  shortness of breath.   Cardiovascular: Positive for leg swelling. Negative for chest pain.  Musculoskeletal: Positive for back pain.  Neurological: Negative for weakness and numbness.  All other systems reviewed and are negative.      Allergies  Review of patient's allergies indicates no known allergies.  Home Medications   Current Outpatient Rx  Name  Route  Sig  Dispense  Refill  . lurasidone (LATUDA) 40 MG TABS tablet   Oral   Take 20 mg by mouth daily with breakfast.         . Multiple Vitamin (MULTIVITAMIN) capsule   Oral   Take 1 capsule by mouth daily.         . hydrOXYzine (ATARAX/VISTARIL) 25 MG tablet      Take 1 tablet (25 mg) three times daily as needed for anxiety   90 tablet   0   . traZODone (DESYREL) 50 MG tablet   Oral   Take 1 tablet (50 mg total) by mouth at bedtime as needed for sleep.   30 tablet   0    Triage Vitals: BP 129/76  Pulse 71  Temp(Src) 98.1 F (36.7 C)  Resp 18  Ht 5\' 11"  (1.803 m)  Wt 186 lb (84.369 kg)  BMI 25.95 kg/m2  SpO2 99% Physical Exam  Nursing note and vitals reviewed.  Constitutional: He is oriented to person, place, and time. He appears well-developed and well-nourished. No distress.  HENT:  Head: Normocephalic and atraumatic.  Neck: Neck supple. No tracheal deviation present.  Cardiovascular: Normal rate, regular rhythm, normal heart sounds and intact distal pulses.   No murmur heard. Pulses:      Dorsalis pedis pulses are 2+ on the right side, and 2+ on the left side.  Pulmonary/Chest: Effort normal and breath sounds normal. No respiratory distress. He has no wheezes. He has no rales.  No swelling noted to either breast  Abdominal: Soft. Bowel sounds are normal. There is no tenderness.  Musculoskeletal:  Left lower leg slightly more swollen than right lower leg. No pitting edema. Distal 1/3 of each leg is hairless and shiny. No erythema  Neurological: He is alert and oriented to person, place, and time.   Skin: Skin is warm and dry. No erythema.  Psychiatric: He has a normal mood and affect. His behavior is normal.    ED Course  Procedures (including critical care time) DIAGNOSTIC STUDIES: Oxygen Saturation is 99% on room air, normal by my interpretation.    COORDINATION OF CARE: 10:19 PM- Concerns of blood clots. Ultrasound is available. Pt advised of plan for treatment and pt agrees.    Labs Review Labs Reviewed  COMPREHENSIVE METABOLIC PANEL - Abnormal; Notable for the following:    ALT 71 (*)    Total Bilirubin <0.2 (*)    All other components within normal limits  CBC   Imaging Review US Venous Img Lower Unilateral Left  01/23/2014   CLINICAL DATA:  Left leg pain and swelling  EXAM: Left LOWER EXTREMITY VENOUS DOPPLER ULTRASOUND  TECHNIQUE: Gray-scale sonography with graded compression, as well as color Doppler and duplex ultrasound, were performed to evaluate the deep venous system from the level of the common femoral vein through the popliteal and proximal calf veins. Spectral Doppler was utilized to evaluate flow at rest and with distal augmentation maneuvers.  COMPARISON:  None.  FINDINGS: Thrombus within deep veins:  None visualized.  Compressibility of deep veins:  Normal.  Duplex waveform respiratory phasicity:  Normal.  Duplex waveform response to augmentation:  Normal.  Venous reflux:  None visualized.  Other findings:  None visualized.  IMPRESSION: Negative for left leg DVT.   Electronically Signed   By: Franchot Gallo M.D.   On: 01/23/2014 23:42     EKG Interpretation None      MDM   Final diagnoses:  None    DVT u/s is negative for acute DVT. Labwork benign. At this time I feel he is stable for discharge and able to follow up with PCP for further outpatient workup. No chest/cardiac symptoms, thus no concern for PE, ACS or CHF.  I personally performed the services described in this documentation, which was scribed in my presence. The recorded information has  been reviewed and is accurate.    Jonathan Hamburger, MD 01/23/14 [id number]  Jonathan Hamburger, MD 01/24/14 2409  Jonathan Hamburger, MD 01/24/14 [id number]

## 2014-01-23 NOTE — ED Notes (Signed)
Reports chronic leg pain, tingling.  Noted some swelling to bilateral legs today, left worse than right.  Also noted left breast is swollen when getting out of the shower.  Currently in treatment at [facility].

## 2014-01-24 NOTE — ED Notes (Signed)
D/c back to [facility] via their transport- no new Rx given

## 2014-01-24 NOTE — Discharge Instructions (Signed)
Edema Edema is an abnormal build-up of fluids in tissues. Because this is partly dependent on gravity (water flows to the lowest place), it is more common in the legs and thighs (lower extremities). It is also common in the looser tissues, like around the eyes. Painless swelling of the feet and ankles is common and increases as a person ages. It may affect both legs and may include the calves or even thighs. When squeezed, the fluid may move out of the affected area and may leave a dent for a few moments. CAUSES   Prolonged standing or sitting in one place for extended periods of time. Movement helps pump tissue fluid into the veins, and absence of movement prevents this, resulting in edema.  Varicose veins. The valves in the veins do not work as well as they should. This causes fluid to leak into the tissues.  Fluid and salt overload.  Injury, burn, or surgery to the leg, ankle, or foot, may damage veins and allow fluid to leak out.  Sunburn damages vessels. Leaky vessels allow fluid to go out into the sunburned tissues.  Allergies (from insect bites or stings, medications or chemicals) cause swelling by allowing vessels to become leaky.  Protein in the blood helps keep fluid in your vessels. Low protein, as in malnutrition, allows fluid to leak out.  Hormonal changes, including pregnancy and menstruation, cause fluid retention. This fluid may leak out of vessels and cause edema.  Medications that cause fluid retention. Examples are sex hormones, blood pressure medications, steroid treatment, or anti-depressants.  Some illnesses cause edema, especially heart failure, kidney disease, or liver disease.  Surgery that cuts veins or lymph nodes, such as surgery done for the heart or for breast cancer, may result in edema. DIAGNOSIS  Your caregiver is usually easily able to determine what is causing your swelling (edema) by simply asking what is wrong (getting a history) and examining you (doing  a physical). Sometimes x-rays, EKG (electrocardiogram or heart tracing), and blood work may be done to evaluate for underlying medical illness. TREATMENT  General treatment includes:  Leg elevation (or elevation of the affected body part).  Restriction of fluid intake.  Prevention of fluid overload.  Compression of the affected body part. Compression with elastic bandages or support stockings squeezes the tissues, preventing fluid from entering and forcing it back into the blood vessels.  Diuretics (also called water pills or fluid pills) pull fluid out of your body in the form of increased urination. These are effective in reducing the swelling, but can have side effects and must be used only under your caregiver's supervision. Diuretics are appropriate only for some types of edema. The specific treatment can be directed at any underlying causes discovered. Heart, liver, or kidney disease should be treated appropriately. HOME CARE INSTRUCTIONS   Elevate the legs (or affected body part) above the level of the heart, while lying down.  Avoid sitting or standing still for prolonged periods of time.  Avoid putting anything directly under the knees when lying down, and do not wear constricting clothing or garters on the upper legs.  Exercising the legs causes the fluid to work back into the veins and lymphatic channels. This may help the swelling go down.  The pressure applied by elastic bandages or support stockings can help reduce ankle swelling.  A low-salt diet may help reduce fluid retention and decrease the ankle swelling.  Take any medications exactly as prescribed. SEEK MEDICAL CARE IF:  Your edema is   not responding to recommended treatments. SEEK IMMEDIATE MEDICAL CARE IF:   You develop shortness of breath or chest pain.  You cannot breathe when you lay down; or if, while lying down, you have to get up and go to the window to get your breath.  You are having increasing  swelling without relief from treatment.  You develop a fever over 102 F (38.9 C).  You develop pain or redness in the areas that are swollen.  Tell your caregiver right away if you have gained 03 lb/1.4 kg in 1 day or 05 lb/2.3 kg in a week. MAKE SURE YOU:   Understand these instructions.  Will watch your condition.  Will get help right away if you are not doing well or get worse. Document Released: 10/29/2005 Document Revised: 04/29/2012 Document Reviewed: 06/16/2008 [employer] Patient Information 2014 [employer].  

## 2014-04-08 ENCOUNTER — Encounter (HOSPITAL_BASED_OUTPATIENT_CLINIC_OR_DEPARTMENT_OTHER): Payer: Self-pay | Admitting: Emergency Medicine

## 2014-04-08 ENCOUNTER — Emergency Department (HOSPITAL_BASED_OUTPATIENT_CLINIC_OR_DEPARTMENT_OTHER): Payer: Worker's Compensation

## 2014-04-08 ENCOUNTER — Emergency Department (HOSPITAL_BASED_OUTPATIENT_CLINIC_OR_DEPARTMENT_OTHER)
Admission: EM | Admit: 2014-04-08 | Discharge: 2014-04-09 | Disposition: A | Discharge status: Home / Self Care | Payer: Worker's Compensation | Attending: Emergency Medicine | Admitting: Emergency Medicine

## 2014-04-08 DIAGNOSIS — R0982 Postnasal drip: Secondary | ICD-10-CM | POA: Insufficient documentation

## 2014-04-08 DIAGNOSIS — Z79899 Other long term (current) drug therapy: Secondary | ICD-10-CM | POA: Insufficient documentation

## 2014-04-08 DIAGNOSIS — R05 Cough: Secondary | ICD-10-CM

## 2014-04-08 DIAGNOSIS — F3289 Other specified depressive episodes: Secondary | ICD-10-CM | POA: Insufficient documentation

## 2014-04-08 DIAGNOSIS — F1021 Alcohol dependence, in remission: Secondary | ICD-10-CM | POA: Insufficient documentation

## 2014-04-08 DIAGNOSIS — F17200 Nicotine dependence, unspecified, uncomplicated: Secondary | ICD-10-CM | POA: Insufficient documentation

## 2014-04-08 DIAGNOSIS — F329 Major depressive disorder, single episode, unspecified: Secondary | ICD-10-CM | POA: Insufficient documentation

## 2014-04-08 DIAGNOSIS — R059 Cough, unspecified: Secondary | ICD-10-CM | POA: Insufficient documentation

## 2014-04-08 MED ORDER — FLUTICASONE PROPIONATE 50 MCG/ACT NA SUSP: 2.0000 | Freq: Every day | NASAL | Status: DC

## 2014-04-08 MED ORDER — IBUPROFEN 800 MG PO TABS
800.0000 mg | ORAL_TABLET | Freq: Once | ORAL | Status: AC
  Administered 2014-04-09: 800 mg via ORAL
  Filled 2014-04-08: qty 1

## 2014-04-08 MED ORDER — NAPROXEN 375 MG PO TABS: 375.0000 mg | ORAL_TABLET | Freq: Two times a day (BID) | ORAL | Status: DC

## 2014-04-08 MED ORDER — CETIRIZINE HCL 10 MG PO CAPS: 1.0000 | ORAL_CAPSULE | Freq: Every morning | ORAL | Status: DC

## 2014-04-08 NOTE — Discharge Instructions (Signed)
Cough, Adult  A cough is a reflex. It helps you clear your throat and airways. A cough can help heal your body. A cough can last 2 or 3 weeks (acute) or may last more than 8 weeks (chronic). Some common causes of a cough can include an infection, allergy, or a cold. HOME CARE  Only take medicine as told by your doctor.  If given, take your medicines (antibiotics) as told. Finish them even if you start to feel better.  Use a cold steam vaporizer or humidier in your home. This can help loosen thick spit (secretions).  Sleep so you are almost sitting up (semi-upright). Use pillows to do this. This helps reduce coughing.  Rest as needed.  Stop smoking if you smoke. GET HELP RIGHT AWAY IF:  You have yellowish-white fluid (pus) in your thick spit.  Your cough gets worse.  Your medicine does not reduce coughing, and you are losing sleep.  You cough up blood.  You have trouble breathing.  Your pain gets worse and medicine does not help.  You have a fever. MAKE SURE YOU:   Understand these instructions.  Will watch your condition.  Will get help right away if you are not doing well or get worse. Document Released: 07/12/2011 Document Revised: 01/21/2012 Document Reviewed: 07/12/2011 [facility] Patient Information 2014 [place].  Cool Mist Vaporizers Vaporizers may help relieve the symptoms of a cough and cold. They add moisture to the air, which helps mucus to become thinner and less sticky. This makes it easier to breathe and cough up secretions. Cool mist vaporizers do not cause serious burns like hot mist vaporizers ("steamers, humidifiers"). Vaporizers have not been proved to show they help with colds. You should not use a vaporizer if you are allergic to mold.  HOME CARE INSTRUCTIONS  Follow the package instructions for the vaporizer.  Do not use anything other than distilled water in the vaporizer.  Do not run the vaporizer all of the time. This can cause mold or  bacteria to grow in the vaporizer.  Clean the vaporizer after each time it is used.  Clean and dry the vaporizer well before storing it.  Stop using the vaporizer if worsening respiratory symptoms develop. Document Released: 07/26/2004 Document Revised: 07/01/2013 Document Reviewed: 03/18/2013 [facility] Patient Information 2014 [place], [place].

## 2014-04-08 NOTE — ED Notes (Signed)
Sharp pains in upper back today. Worse with deep breath and cough.

## 2014-04-08 NOTE — ED Provider Notes (Signed)
CSN: [id number]     Arrival date & time 04/08/14  2158 History  This chart was scribed for Jarin Cornfield Alfonso Patten, MD by Roxan Diesel, ED scribe.  This patient was seen in room MH11/MH11 and the patient's care was started at 11:52 PM.   Chief Complaint  Patient presents with  . Back Pain    Patient is a 38 y.o. male presenting with back pain. The history is provided by the patient. No language interpreter was used.  Back Pain Pain location: upper back. Quality: sharp. Radiates to:  Does not radiate Pain severity:  Moderate Pain is:  Same all the time Duration: several hours. Timing:  Intermittent Progression:  Unchanged Chronicity:  New Context: not MCA and not occupational injury   Context comment:  Coughing Relieved by:  Nothing Worsened by:  Coughing Ineffective treatments:  None tried Associated symptoms: no abdominal pain, no abdominal swelling, no bladder incontinence, no bowel incontinence, no chest pain, no dysuria, no fever, no leg pain, no numbness and no weakness   Risk factors: no hx of cancer and no recent surgery     HPI Comments: Jonathan Lin is a 38 y.o. male who presents to the Emergency Department complaining of upper back pain that began today.  Pt states he began coughing yesterday.  Today he began to experience sharp pains in his upper back that are worse with coughing.  He has not attempted to treat pain pta.  Pt also notes he has eben congested recently.  He denies wheezing, leg swelling, or fever.  He denies recent long-distance travel or surgeries.  Pt is a current half-pack-per-day smoker.   Past Medical History  Diagnosis Date  . Back pain   . Depression   . Alcoholism   . 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    Past Surgical History  Procedure Laterality Date  . Extraction of wisdom teeth    . Wisdom tooth extraction  1994    No family history on file.   History  Substance Use Topics  . Smoking  status: Current Every Day Smoker -- 0.50 packs/day    Types: Cigarettes  . Smokeless tobacco: Not on file  . Alcohol Use: No     Comment: Former alcoholic.     Review of Systems  Constitutional: Negative for fever.  HENT: Positive for congestion.   Respiratory: Positive for cough. Negative for chest tightness and shortness of breath.   Cardiovascular: Negative for chest pain, palpitations and leg swelling.  Gastrointestinal: Negative for abdominal pain and bowel incontinence.  Genitourinary: Negative for bladder incontinence and dysuria.  Musculoskeletal: Positive for back pain.  Neurological: Negative for weakness and numbness.  All other systems reviewed and are negative.     Allergies  Review of patient's allergies indicates no known allergies.  Home Medications   Prior to Admission medications   Medication Sig Start Date End Date Taking? Authorizing Provider  hydrOXYzine (ATARAX/VISTARIL) 25 MG tablet Take 1 tablet (25 mg) three times daily as needed for anxiety 12/28/13   Encarnacion Slates, NP  lurasidone (LATUDA) 40 MG TABS tablet Take 20 mg by mouth daily with breakfast.    Historical Provider, MD  Multiple Vitamin (MULTIVITAMIN) capsule Take 1 capsule by mouth daily.    Historical Provider, MD  traZODone (DESYREL) 50 MG tablet Take 1 tablet (50 mg total) by mouth at bedtime as needed for sleep. 12/28/13   Encarnacion Slates, NP   BP [phone]  Pulse 75  Temp(Src) 97.8 F (36.6 C) (Oral)  Resp 18  Ht 5\' 11"  (1.803 m)  Wt 170 lb (77.111 kg)  BMI 23.72 kg/m2  SpO2 100%  Physical Exam  Nursing note and vitals reviewed. Constitutional: He is oriented to person, place, and time. He appears well-developed and well-nourished. No distress.  HENT:  Head: Normocephalic and atraumatic.  Mouth/Throat: Oropharynx is clear and moist. No oropharyngeal exudate.  Small amount of clear, colorless postnasal drip down back of throat  Eyes: EOM are normal. Pupils are equal, round, and reactive  to light.  Neck: Normal range of motion. Neck supple. No tracheal deviation present.  Cardiovascular: Normal rate and regular rhythm.   Pulmonary/Chest: Effort normal and breath sounds normal. No accessory muscle usage. Not tachypneic. No respiratory distress. He has no wheezes. He has no rhonchi. He has no rales. He exhibits no tenderness.  Abdominal: Soft. Bowel sounds are normal. There is no tenderness. There is no rebound and no guarding.  Musculoskeletal: Normal range of motion. He exhibits no edema and no tenderness.  No cords or swelling in lower legs  Neurological: He is alert and oriented to person, place, and time. He has normal reflexes.  Skin: Skin is warm and dry.  Psychiatric: He has a normal mood and affect. His behavior is normal.    ED Course  Procedures (including critical care time)  DIAGNOSTIC STUDIES: Oxygen Saturation is 100% on room air, normal by my interpretation.    COORDINATION OF CARE: 11:55 PM-Discussed treatment plan which includes nasal spray and chest decongestant with pt at bedside and pt agreed to plan.     Labs Review Labs Reviewed - No data to display  Imaging Review Dg Chest 2 View  04/08/2014   CLINICAL DATA:  Sharp pain and upper back today worse with deep breath and cough, smoker  EXAM: CHEST  2 VIEW  COMPARISON:  05/08/2010  FINDINGS: Normal heart size, mediastinal contours, and pulmonary vascularity.  Lungs hyperinflated but clear.  No pleural effusion or pneumothorax.  No acute osseous findings.  IMPRESSION: Hyperinflated lungs without acute abnormalities.   Electronically Signed   By: Lavonia Dana M.D.   On: 04/08/2014 23:38     EKG Interpretation None      MDM   Final diagnoses:  None  Perc negative wells 0 doubt PE causing back pain.  NO SOB not leg pain or swelling no long car trips or plane trips.  Symptoms are only a few hours old.    Nasal congestion and Post nasal drip, likely causing cough.  Likely allergic.  Will treat for  same.     I personally performed the services described in this documentation, which was scribed in my presence. The recorded information has been reviewed and is accurate.     Carlisle Beers, MD 04/09/14 [phone]

## 2014-04-08 NOTE — ED Notes (Signed)
C/o cough x 2 days  Today started having sharp upper back pain increased w cough

## 2014-04-09 ENCOUNTER — Encounter (HOSPITAL_BASED_OUTPATIENT_CLINIC_OR_DEPARTMENT_OTHER): Payer: Self-pay | Admitting: Emergency Medicine

## 2014-12-12 ENCOUNTER — Encounter (HOSPITAL_BASED_OUTPATIENT_CLINIC_OR_DEPARTMENT_OTHER): Payer: Self-pay | Admitting: *Deleted

## 2014-12-12 ENCOUNTER — Emergency Department (HOSPITAL_BASED_OUTPATIENT_CLINIC_OR_DEPARTMENT_OTHER)
Admission: EM | Admit: 2014-12-12 | Discharge: 2014-12-12 | Disposition: A | Discharge status: Home / Self Care | Payer: Worker's Compensation | Attending: Emergency Medicine | Admitting: Emergency Medicine

## 2014-12-12 DIAGNOSIS — Z79899 Other long term (current) drug therapy: Secondary | ICD-10-CM | POA: Insufficient documentation

## 2014-12-12 DIAGNOSIS — R05 Cough: Secondary | ICD-10-CM | POA: Insufficient documentation

## 2014-12-12 DIAGNOSIS — Z720 Tobacco use: Secondary | ICD-10-CM | POA: Insufficient documentation

## 2014-12-12 DIAGNOSIS — R059 Cough, unspecified: Secondary | ICD-10-CM

## 2014-12-12 DIAGNOSIS — F319 Bipolar disorder, unspecified: Secondary | ICD-10-CM | POA: Insufficient documentation

## 2014-12-12 DIAGNOSIS — Z791 Long term (current) use of non-steroidal anti-inflammatories (NSAID): Secondary | ICD-10-CM | POA: Insufficient documentation

## 2014-12-12 DIAGNOSIS — Z7951 Long term (current) use of inhaled steroids: Secondary | ICD-10-CM | POA: Insufficient documentation

## 2014-12-12 HISTORY — DX: Bipolar disorder, unspecified: F31.9

## 2014-12-12 MED ORDER — ALBUTEROL SULFATE HFA 108 (90 BASE) MCG/ACT IN AERS
2.0000 | INHALATION_SPRAY | RESPIRATORY_TRACT | Status: DC | PRN
  Administered 2014-12-12: 2 via RESPIRATORY_TRACT
  Filled 2014-12-12: qty 6.7

## 2014-12-12 MED ORDER — HYDROCOD POLST-CHLORPHEN POLST 10-8 MG/5ML PO LQCR: 5.0000 mL | Freq: Two times a day (BID) | ORAL | Status: DC | PRN

## 2014-12-12 NOTE — ED Provider Notes (Addendum)
CSN: [id number]     Arrival date & time 12/12/14  0335 History   First MD Initiated Contact with Patient 12/12/14 0401     Chief Complaint  Patient presents with  . Cough     (Consider location/radiation/quality/duration/timing/severity/associated sxs/prior Treatment) HPI  This is a 39 year old male who has been smoking since he was 63. He is here with an illness that began yesterday evening about 8 PM. It began with general malaise and diarrhea but progressed to severe paroxysms of cough. He has had posttussive emesis as a result of vomiting. He has had some rhinorrhea as well as some dyspnea. His cough has been productive of yellow sputum, occasionally blood-streaked. He denies fever or chills.  Past Medical History  Diagnosis Date  . Back pain   . Depression   . Alcoholism   . 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  . Bipolar 1 disorder    Past Surgical History  Procedure Laterality Date  . Extraction of wisdom teeth    . Wisdom tooth extraction  1994   No family history on file. History  Substance Use Topics  . Smoking status: Current Every Day Smoker -- 0.50 packs/day    Types: Cigarettes  . Smokeless tobacco: Not on file  . Alcohol Use: Yes     Comment: once a week 4 beers     Review of Systems  All other systems reviewed and are negative.   Allergies  Review of patient's allergies indicates no known allergies.  Home Medications   Prior to Admission medications   Medication Sig Start Date End Date Taking? Authorizing Provider  Cetirizine HCl (ZYRTEC ALLERGY) 10 MG CAPS Take 1 capsule (10 mg total) by mouth every morning. 04/08/14   April K Palumbo-Rasch, MD  fluticasone [facility]) 50 MCG/ACT nasal spray Place 2 sprays into both nostrils daily. 04/08/14   April K Palumbo-Rasch, MD  hydrOXYzine (ATARAX/VISTARIL) 25 MG tablet Take 1 tablet (25 mg) three times daily as needed for anxiety 12/28/13   Encarnacion Slates, NP  lurasidone (LATUDA)  40 MG TABS tablet Take 20 mg by mouth daily with breakfast.    Historical Provider, MD  Multiple Vitamin (MULTIVITAMIN) capsule Take 1 capsule by mouth daily.    Historical Provider, MD  naproxen (NAPROSYN) 375 MG tablet Take 1 tablet (375 mg total) by mouth 2 (two) times daily. 04/08/14   April K Palumbo-Rasch, MD  traZODone (DESYREL) 50 MG tablet Take 1 tablet (50 mg total) by mouth at bedtime as needed for sleep. 12/28/13   Encarnacion Slates, NP   BP 116/78 mmHg  Pulse 91  Temp(Src) 98.7 F (37.1 C) (Oral)  Ht 5\' 11"  (1.803 m)  Wt 165 lb (74.844 kg)  BMI 23.02 kg/m2  SpO2 100%   Physical Exam  General: Well-developed, well-nourished male in no acute distress; appearance consistent with age of record HENT: normocephalic; atraumatic; facial and palatal petechiae Eyes: pupils equal, round and reactive to light; extraocular muscles intact Neck: supple Heart: regular rate and rhythm Lungs: Mildly diminished breath signs bilaterally Abdomen: soft; nondistended; nontender; no masses or hepatosplenomegaly;  Extremities: no deformity; normal range of motion; pulses normal Neurologic: Awake, alert and oriented; motor function intact in all extremities and symmetric; no facial droop Skin: Warm and dry Psychiatric: Normal mood and affect    ED Course  Procedures (including critical care time)    MDM     Wynetta Fines, MD 12/12/14 8416  Jenny Reichmann  L Esmeralda Blanford, MD 12/12/14 [phone]

## 2014-12-12 NOTE — ED Notes (Addendum)
C/o prod cough that started while he was at work [employer]. States yellow in color. States he has been a smoker since he was 39 years old. States he feels a little sob. No distress noted. States that he had a coughing attack while at work causing him to vomit. States he did have some blood tinged sputum from coughing. Has not had the flu shot yet this year. States he has also had a runny nose. Denies any fevers. Pt has faint  petechiae around both eyes from coughing. States one episode of diarrhea at work as well.

## 2015-03-24 ENCOUNTER — Encounter (HOSPITAL_COMMUNITY): Payer: Self-pay | Admitting: Emergency Medicine

## 2015-03-24 ENCOUNTER — Emergency Department (HOSPITAL_COMMUNITY)
Admission: EM | Admit: 2015-03-24 | Discharge: 2015-03-26 | Disposition: A | Discharge status: Other Acute Inpatient Hospital | Payer: Federal, State, Local not specified - Other | Attending: Emergency Medicine | Admitting: Emergency Medicine

## 2015-03-24 DIAGNOSIS — F319 Bipolar disorder, unspecified: Secondary | ICD-10-CM | POA: Insufficient documentation

## 2015-03-24 DIAGNOSIS — Z79899 Other long term (current) drug therapy: Secondary | ICD-10-CM | POA: Insufficient documentation

## 2015-03-24 DIAGNOSIS — Z720 Tobacco use: Secondary | ICD-10-CM | POA: Insufficient documentation

## 2015-03-24 DIAGNOSIS — F329 Major depressive disorder, single episode, unspecified: Secondary | ICD-10-CM | POA: Diagnosis present

## 2015-03-24 DIAGNOSIS — Z7951 Long term (current) use of inhaled steroids: Secondary | ICD-10-CM | POA: Insufficient documentation

## 2015-03-24 DIAGNOSIS — R45851 Suicidal ideations: Secondary | ICD-10-CM

## 2015-03-24 DIAGNOSIS — F1410 Cocaine abuse, uncomplicated: Secondary | ICD-10-CM | POA: Insufficient documentation

## 2015-03-24 DIAGNOSIS — F1010 Alcohol abuse, uncomplicated: Secondary | ICD-10-CM | POA: Insufficient documentation

## 2015-03-24 LAB — CBC WITH DIFFERENTIAL/PLATELET
Basophils Absolute: 0 10*3/uL (ref 0.0–0.1)
Basophils Relative: 0 % (ref 0–1)
Eosinophils Absolute: 0.1 10*3/uL (ref 0.0–0.7)
Eosinophils Relative: 2 % (ref 0–5)
HCT: 46.1 % (ref 39.0–52.0)
Hemoglobin: 15.4 g/dL (ref 13.0–17.0)
Lymphocytes Relative: 40 % (ref 12–46)
Lymphs Abs: 2.9 10*3/uL (ref 0.7–4.0)
MCH: 30.4 pg (ref 26.0–34.0)
MCHC: 33.4 g/dL (ref 30.0–36.0)
MCV: 91.1 fL (ref 78.0–100.0)
Monocytes Absolute: 0.5 10*3/uL (ref 0.1–1.0)
Monocytes Relative: 7 % (ref 3–12)
Neutro Abs: 3.7 10*3/uL (ref 1.7–7.7)
Neutrophils Relative %: 51 % (ref 43–77)
Platelets: 225 10*3/uL (ref 150–400)
RBC: 5.06 MIL/uL (ref 4.22–5.81)
RDW: 13.3 % (ref 11.5–15.5)
WBC: 7.3 10*3/uL (ref 4.0–10.5)

## 2015-03-24 LAB — RAPID URINE DRUG SCREEN, HOSP PERFORMED
Amphetamines: NOT DETECTED
Barbiturates: NOT DETECTED
Benzodiazepines: NOT DETECTED
Cocaine: POSITIVE — AB
Opiates: NOT DETECTED
Tetrahydrocannabinol: NOT DETECTED

## 2015-03-24 LAB — COMPREHENSIVE METABOLIC PANEL
ALT: 20 U/L (ref 17–63)
AST: 15 U/L (ref 15–41)
Albumin: 4.5 g/dL (ref 3.5–5.0)
Alkaline Phosphatase: 47 U/L (ref 38–126)
Anion gap: 12 (ref 5–15)
BUN: 12 mg/dL (ref 6–20)
CO2: 22 mmol/L (ref 22–32)
Calcium: 9.4 mg/dL (ref 8.9–10.3)
Chloride: 106 mmol/L (ref 101–111)
Creatinine, Ser: 0.67 mg/dL (ref 0.61–1.24)
GFR calc Af Amer: >60 mL/min (ref >60)
GFR calc non Af Amer: >60 mL/min (ref >60)
Glucose, Bld: 92 mg/dL (ref 65–99)
Potassium: 3.7 mmol/L (ref 3.5–5.1)
Sodium: 140 mmol/L (ref 135–145)
Total Bilirubin: 0.5 mg/dL (ref 0.3–1.2)
Total Protein: 8.3 g/dL — ABNORMAL HIGH (ref 6.5–8.1)

## 2015-03-24 LAB — ACETAMINOPHEN LEVEL: Acetaminophen (Tylenol), Serum: <10 ug/mL — ABNORMAL LOW (ref 10–30)

## 2015-03-24 LAB — SALICYLATE LEVEL: Salicylate Lvl: <4 mg/dL (ref 2.8–30.0)

## 2015-03-24 LAB — ETHANOL: Alcohol, Ethyl (B): 111 mg/dL — ABNORMAL HIGH (ref <5)

## 2015-03-24 MED ORDER — ACETAMINOPHEN 325 MG PO TABS: 650.0000 mg | ORAL_TABLET | ORAL | Status: DC | PRN

## 2015-03-24 MED ORDER — LORAZEPAM 1 MG PO TABS
1.0000 mg | ORAL_TABLET | Freq: Three times a day (TID) | ORAL | Status: DC | PRN
Start: 2015-03-24 — End: 2015-03-26
  Administered 2015-03-24 – 2015-03-25 (×2): 1 mg via ORAL
  Filled 2015-03-24: qty 1

## 2015-03-24 MED ORDER — IBUPROFEN 200 MG PO TABS
600.0000 mg | ORAL_TABLET | Freq: Three times a day (TID) | ORAL | Status: DC | PRN
  Administered 2015-03-25: 600 mg via ORAL
  Filled 2015-03-24: qty 3

## 2015-03-24 MED ORDER — NICOTINE 21 MG/24HR TD PT24
21.0000 mg | MEDICATED_PATCH | Freq: Every day | TRANSDERMAL | Status: DC
  Administered 2015-03-25 – 2015-03-26 (×2): 21 mg via TRANSDERMAL
  Filled 2015-03-24 (×2): qty 1

## 2015-03-24 MED ORDER — ONDANSETRON HCL 4 MG PO TABS: 4.0000 mg | ORAL_TABLET | Freq: Three times a day (TID) | ORAL | Status: DC | PRN

## 2015-03-24 MED ORDER — VITAMIN B-1 100 MG PO TABS
100.0000 mg | ORAL_TABLET | Freq: Every day | ORAL | Status: DC
  Administered 2015-03-25 – 2015-03-26 (×2): 100 mg via ORAL
  Filled 2015-03-24 (×2): qty 1

## 2015-03-24 MED ORDER — LORAZEPAM 1 MG PO TABS
0.0000 mg | ORAL_TABLET | Freq: Four times a day (QID) | ORAL | Status: DC
  Administered 2015-03-25: 1 mg via ORAL
  Administered 2015-03-25: 2 mg via ORAL
  Administered 2015-03-25 – 2015-03-26 (×2): 1 mg via ORAL
  Filled 2015-03-24: qty 1
  Filled 2015-03-24: qty 2
  Filled 2015-03-24 (×3): qty 1

## 2015-03-24 MED ORDER — THIAMINE HCL 100 MG/ML IJ SOLN: 100.0000 mg | Freq: Every day | INTRAMUSCULAR | Status: DC

## 2015-03-24 MED ORDER — LORAZEPAM 1 MG PO TABS: 0.0000 mg | ORAL_TABLET | Freq: Two times a day (BID) | ORAL | Status: DC

## 2015-03-24 MED ORDER — ZOLPIDEM TARTRATE 5 MG PO TABS
5.0000 mg | ORAL_TABLET | Freq: Every evening | ORAL | Status: DC | PRN
  Administered 2015-03-24: 5 mg via ORAL
  Filled 2015-03-24: qty 1

## 2015-03-24 MED ORDER — ALUM & MAG HYDROXIDE-SIMETH 200-200-20 MG/5ML PO SUSP: 30.0000 mL | ORAL | Status: DC | PRN

## 2015-03-24 NOTE — ED Provider Notes (Signed)
CSN: [id number]     Arrival date & time 03/24/15  1924 History   First MD Initiated Contact with Patient 03/24/15 2022     Chief Complaint  Patient presents with  . Suicidal     (Consider location/radiation/quality/duration/timing/severity/associated sxs/prior Treatment) The history is provided by the patient and medical records.    This is a 39 y.o. M with PMH significant for depression, alcoholism, bipolar disorder,  Presenting  T ED for depression and suicidal ideation without plan. Patient has a long-standing history of alcohol abuse. He was a former heroin and cocaine addict, however he has replaced the substances with alcohol. He states he drank heavily last night began feeling suicidal. He states he has intermittent episodes of suicidal ideation, however he is usually able to calm himself at home. He states this episode is worse than normal and he felt unsafe being at home.   He admits to drinking approximately a fifth of liquor and a sixpack of beer multiple times a week. He denies any illicit drug use. He does smoke cigarettes. He denies any homicidal ideation. No auditory or visual hallucinations. Patient has attempted inpatient outpatient detox in the past , but always finds himself relapsing for unknown reasons.  Past Medical History  Diagnosis Date  . Back pain   . Depression   . Alcoholism   . 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  . Bipolar 1 disorder    Past Surgical History  Procedure Laterality Date  . Extraction of wisdom teeth    . Wisdom tooth extraction  1994   No family history on file. History  Substance Use Topics  . Smoking status: Current Every Day Smoker -- 0.50 packs/day    Types: Cigarettes  . Smokeless tobacco: Not on file  . Alcohol Use: Yes     Comment: once a week 4 beers     Review of Systems  Psychiatric/Behavioral: Positive for suicidal ideas.  All other systems reviewed and are negative.     Allergies   Review of patient's allergies indicates no known allergies.  Home Medications   Prior to Admission medications   Medication Sig Start Date End Date Taking? Authorizing Provider  Cetirizine HCl (ZYRTEC ALLERGY) 10 MG CAPS Take 1 capsule (10 mg total) by mouth every morning. 04/08/14   April Palumbo, MD  chlorpheniramine-HYDROcodone [facility]) 10-8 MG/5ML LQCR Take 5 mLs by mouth every 12 (twelve) hours as needed. 12/12/14   John Molpus, MD  fluticasone (FLONASE) 50 MCG/ACT nasal spray Place 2 sprays into both nostrils daily. 04/08/14   April Palumbo, MD  hydrOXYzine (ATARAX/VISTARIL) 25 MG tablet Take 1 tablet (25 mg) three times daily as needed for anxiety 12/28/13   Encarnacion Slates, NP  lurasidone (LATUDA) 40 MG TABS tablet Take 20 mg by mouth daily with breakfast.    Historical Provider, MD  Multiple Vitamin (MULTIVITAMIN) capsule Take 1 capsule by mouth daily.    Historical Provider, MD  naproxen (NAPROSYN) 375 MG tablet Take 1 tablet (375 mg total) by mouth 2 (two) times daily. 04/08/14   April Palumbo, MD  traZODone (DESYREL) 50 MG tablet Take 1 tablet (50 mg total) by mouth at bedtime as needed for sleep. 12/28/13   Encarnacion Slates, NP   BP 125/74 mmHg  Pulse 84  Temp(Src) 98.4 F (36.9 C) (Oral)  Resp 18  SpO2 95%   Physical Exam  Constitutional: He is oriented to person, place, and time. He  appears well-developed and well-nourished. No distress.  HENT:  Head: Normocephalic and atraumatic.  Mouth/Throat: Oropharynx is clear and moist.  Eyes: Conjunctivae and EOM are normal. Pupils are equal, round, and reactive to light.  Neck: Normal range of motion. Neck supple.  Cardiovascular: Normal rate, regular rhythm and normal heart sounds.   Pulmonary/Chest: Effort normal and breath sounds normal. No respiratory distress. He has no wheezes.  Abdominal: Soft. Bowel sounds are normal. There is no tenderness. There is no guarding.  Musculoskeletal: Normal range of motion. He  exhibits no edema.  Neurological: He is alert and oriented to person, place, and time.  Skin: Skin is warm and dry. He is not diaphoretic.  Psychiatric: He has a normal mood and affect. He is not actively hallucinating. He expresses suicidal ideation. He expresses no homicidal ideation. He expresses no suicidal plans and no homicidal plans.  Nursing note and vitals reviewed.   ED Course  Procedures (including critical care time) Labs Review Labs Reviewed  COMPREHENSIVE METABOLIC PANEL - Abnormal; Notable for the following:    Total Protein 8.3 (*)    All other components within normal limits  ETHANOL - Abnormal; Notable for the following:    Alcohol, Ethyl (B) 111 (*)    All other components within normal limits  URINE RAPID DRUG SCREEN (HOSP PERFORMED) - Abnormal; Notable for the following:    Cocaine POSITIVE (*)    All other components within normal limits  ACETAMINOPHEN LEVEL - Abnormal; Notable for the following:    Acetaminophen (Tylenol), Serum <10 (*)    All other components within normal limits  CBC WITH DIFFERENTIAL/PLATELET  SALICYLATE LEVEL    Imaging Review No results found.   EKG Interpretation None      MDM   Final diagnoses:  Suicidal ideation  Alcohol abuse   39 y.o. M here with depression, suicidal ideation, and alcohol abuse.  Denies specific plan of suicide. No homicidal ideation. No auditory or visual hallucinations.   Patient has no physical complaints at this time.  Lab work as above, ethanol 111 and UDS + for cocaine. Patient medically cleared and awaiting TTS evaluation.  Temp holding orders placed, patient on CIWA protocol.  Larene Pickett, PA-C 03/25/15 1031  Ripley Fraise, MD 03/25/15 [phone]

## 2015-03-24 NOTE — ED Notes (Signed)
Pt presents with SI, no specific plan, feeling hopeless.  Pt reports 1 1/2 yrs ago he attempted to hang him self.  Denies AV hallucinations or HI.  Pt admits to heroin, crack use and is a binge drinker.  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  AAO x 3, no distress noted, calm & cooperative, monitoring for safety, Q 15 min checks in effect.

## 2015-03-24 NOTE — ED Notes (Signed)
Pt reports "recovering addict of alcohol, heroin, crack", SI with pain, seeking detox, denies pain, A&O x4. Pt changed into purple scrubs, belongings and pt wanded by security, denies needs at this time, friend at bedside.

## 2015-03-25 DIAGNOSIS — F322 Major depressive disorder, single episode, severe without psychotic features: Secondary | ICD-10-CM | POA: Diagnosis not present

## 2015-03-25 DIAGNOSIS — F332 Major depressive disorder, recurrent severe without psychotic features: Secondary | ICD-10-CM | POA: Diagnosis not present

## 2015-03-25 DIAGNOSIS — R45851 Suicidal ideations: Secondary | ICD-10-CM | POA: Diagnosis not present

## 2015-03-25 MED ORDER — ARIPIPRAZOLE 5 MG PO TABS
5.0000 mg | ORAL_TABLET | Freq: Every day | ORAL | Status: DC
  Administered 2015-03-25 – 2015-03-26 (×2): 5 mg via ORAL
  Filled 2015-03-25 (×2): qty 1

## 2015-03-25 MED ORDER — TRAZODONE HCL 50 MG PO TABS
50.0000 mg | ORAL_TABLET | Freq: Every day | ORAL | Status: DC
  Administered 2015-03-25: 50 mg via ORAL
  Filled 2015-03-25: qty 1

## 2015-03-25 NOTE — BH Assessment (Signed)
Seeking MH/SA treatment. Sent referrals to: Charlann Lange, [facility], [facility] , [place], [place] Triage Specialist 03/25/2015 4:41 AM

## 2015-03-25 NOTE — Progress Notes (Signed)
Per RN Parzel, pt declined at [facility] due to primary chemical dependency.  Verlon Setting, [place] Disposition staff 03/25/2015 9:08 PM
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
## 2015-03-25 NOTE — ED Notes (Signed)
Pt AAO x 3, no acute distress noted,  Monitoring for safety, Q 15 min checks in effect.

## 2015-03-25 NOTE — BH Assessment (Signed)
[facility] Assessment Progress Note  The following facilities were contacted in an attempt to place the pt:  Beds available, referral faxed: [facility] (wait list) [facility] (wait list) [facility]  Left message, no answer: [facility]  At capacity: [facility]  TTS will continue to seek placement for the pt.  Shaune Pascal, MS, [facility] Therapeutic Triage Specialist [facility]

## 2015-03-25 NOTE — Consult Note (Signed)
[facility] Face-to-Face Psychiatry Consult   Reason for Consult:  Recurrent Major depressive disorder, severe, suicidal ideation. Referring Physician:  EDP Patient Identification: Jonathan Lin MRN:  [id number] Principal Diagnosis: MDD (major depressive disorder) Diagnosis:   Patient Active Problem List   Diagnosis Date Noted  . MDD (major depressive disorder) [F32.2] 10/29/2013    Priority: High  . Alcohol dependence [F10.20] 10/30/2013  . ADHD (attention deficit hyperactivity disorder), combined type [F90.2] 10/30/2013  . Alcoholic [Z30.86] 57/84/6962  . TINEA PEDIS [B35.3] 01/18/2010  . 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 GERD [K21.9] 12/21/2009  . LOW BACK PAIN, CHRONIC [M54.5] 12/21/2009  . DRUG ABUSE, HX OF [Z91.89] 12/21/2009    Total Time spent with patient: 1 hour  Subjective:   Jonathan Lin is a 39 y.o. male patient admitted with Major depressive disorder, recurrent , severe.  HPI: Caucasian male, 39 years old was evaluated for increased depression and suicidal thoughts.  Patient states he does not have any plans on how to kill himself but knows that he is suicidal.  He also reports Binge drinking Alcohol and could not quantify his drinking.   His Alcohol level was 111 and his UDS is positive for Cocaine.   Patient  Reported that he used Heroin last 9 months ago but is now using Cocaine  Patient admitted to a diagnosis of MDD, Bipolar disorder and substance abuse.  He was last hospitalized this February at our [facility].  Patient reports that he has not been taking his MH medications due to cost.  He stated that he has been using Alcohol and Cocaine to treat his mood disorder. He reports poor sleep and appetite. Patient is willing to take his medications but could not afford them.  Patient is unable to contract for safety and has been accepted for admission and we will be seeking placement at any hospital with available in patient Psychiatric bed.  HPI Elements:   Location:  Recurrent Major  depression, Suicidal ideation, Bipolar disorder,hx. Quality:  severe. Severity:  severe. Timing:  Acute. Duration:  Chronic Mental illness. Context:  Seeking treatment for depression and suicidal ideation..  Past Medical History:  Past Medical History  Diagnosis Date  . Back pain   . Depression   . Alcoholism   . 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  . Bipolar 1 disorder     Past Surgical History  Procedure Laterality Date  . Extraction of wisdom teeth    . Wisdom tooth extraction  1994   Family History: No family history on file. Social History:  History  Alcohol Use  . Yes    Comment: once a week 4 beers      History  Drug Use  . Yes  . Special: Cocaine    Comment: Heroin    History   Social History  . Marital Status: Legally Separated    Spouse Name: N/A  . Number of Children: N/A  . Years of Education: N/A   Social History Main Topics  . Smoking status: Current Every Day Smoker -- 0.50 packs/day    Types: Cigarettes  . Smokeless tobacco: Not on file  . Alcohol Use: Yes     Comment: once a week 4 beers   . Drug Use: Yes    Special: Cocaine     Comment: Heroin  . Sexual Activity: Yes    Birth Control/ Protection: Condom   Other Topics Concern  . None   Social History Narrative  Additional Social History:    Pain Medications: None Prescriptions: None Over the Counter: None History of alcohol / drug use?: Yes Withdrawal Symptoms: Patient aware of relationship between substance abuse and physical/medical complications, Tremors, Fever / Chills, Diarrhea, Sweats, Nausea / Vomiting, Weakness Name of Substance 1: ETOH (whiskey & beer) 1 - Age of First Use: 39 years of age 34 - Amount (size/oz): 1 pint & a 12 pack or a 5th 1 - Frequency: Will binge for two days.  It will last a day or two 1 - Duration: Off and on bninge drinking 1 - Last Use / Amount: 05/12 a pint and almost a case of beer Name of Substance 2:  Cocaine 2 - Age of First Use: 39 years of age 68 - Amount (size/oz): Can go through $200-$300 in a night 2 - Frequency: Twice in a month 2 - Duration: Less than a year at that rate 2 - Last Use / Amount: 05/12 Name of Substance 3: Heroin 9snorting) 3 - Age of First Use: mid 20's 3 - Amount (size/oz): 2 lines at a time 3 - Frequency: Varies 3 - Duration: Off and on 3 - Last Use / Amount: Not used in over a month               Allergies:  No Known Allergies  Labs:  Results for orders placed or performed during the hospital encounter of 03/24/15 (from the past 48 hour(s))  CBC with Differential     Status: None   Collection Time: 03/24/15  9:22 PM  Result Value Ref Range   WBC 7.3 4.0 - 10.5 K/uL   RBC 5.06 4.22 - 5.81 MIL/uL   Hemoglobin 15.4 13.0 - 17.0 g/dL   HCT 46.1 39.0 - 52.0 %   MCV 91.1 78.0 - 100.0 fL   MCH 30.4 26.0 - 34.0 pg   MCHC 33.4 30.0 - 36.0 g/dL   RDW 13.3 11.5 - 15.5 %   Platelets 225 150 - 400 K/uL   Neutrophils Relative % 51 43 - 77 %   Neutro Abs 3.7 1.7 - 7.7 K/uL   Lymphocytes Relative 40 12 - 46 %   Lymphs Abs 2.9 0.7 - 4.0 K/uL   Monocytes Relative 7 3 - 12 %   Monocytes Absolute 0.5 0.1 - 1.0 K/uL   Eosinophils Relative 2 0 - 5 %   Eosinophils Absolute 0.1 0.0 - 0.7 K/uL   Basophils Relative 0 0 - 1 %   Basophils Absolute 0.0 0.0 - 0.1 K/uL  Comprehensive metabolic panel     Status: Abnormal   Collection Time: 03/24/15  9:22 PM  Result Value Ref Range   Sodium 140 135 - 145 mmol/L   Potassium 3.7 3.5 - 5.1 mmol/L   Chloride 106 101 - 111 mmol/L   CO2 22 22 - 32 mmol/L   Glucose, Bld 92 65 - 99 mg/dL   BUN 12 6 - 20 mg/dL   Creatinine, Ser 0.67 0.61 - 1.24 mg/dL   Calcium 9.4 8.9 - 10.3 mg/dL   Total Protein 8.3 (H) 6.5 - 8.1 g/dL   Albumin 4.5 3.5 - 5.0 g/dL   AST 15 15 - 41 U/L   ALT 20 17 - 63 U/L   Alkaline Phosphatase 47 38 - 126 U/L   Total Bilirubin 0.5 0.3 - 1.2 mg/dL   GFR calc non Af Amer >60 >60 mL/min   GFR calc Af  Amer >60 >60 mL/min    Comment: (  NOTE) The eGFR has been calculated using the CKD EPI equation. This calculation has not been validated in all clinical situations. eGFR's persistently <60 mL/min signify possible Chronic Kidney Disease.    Anion gap 12 5 - 15  Ethanol     Status: Abnormal   Collection Time: 03/24/15  9:22 PM  Result Value Ref Range   Alcohol, Ethyl (B) 111 (H) <5 mg/dL    Comment:        LOWEST DETECTABLE LIMIT FOR SERUM ALCOHOL IS 11 mg/dL FOR MEDICAL PURPOSES ONLY   Acetaminophen level     Status: Abnormal   Collection Time: 03/24/15  9:22 PM  Result Value Ref Range   Acetaminophen (Tylenol), Serum <10 (L) 10 - 30 ug/mL    Comment:        THERAPEUTIC CONCENTRATIONS VARY SIGNIFICANTLY. A RANGE OF 10-30 ug/mL MAY BE AN EFFECTIVE CONCENTRATION FOR MANY PATIENTS. HOWEVER, SOME ARE BEST TREATED AT CONCENTRATIONS OUTSIDE THIS RANGE. ACETAMINOPHEN CONCENTRATIONS >150 ug/mL AT 4 HOURS AFTER INGESTION AND >50 ug/mL AT 12 HOURS AFTER INGESTION ARE OFTEN ASSOCIATED WITH TOXIC REACTIONS.   Salicylate level     Status: None   Collection Time: 03/24/15  9:22 PM  Result Value Ref Range   Salicylate Lvl <4.5 2.8 - 30.0 mg/dL  Urine rapid drug screen (hosp performed)     Status: Abnormal   Collection Time: 03/24/15 10:16 PM  Result Value Ref Range   Opiates NONE DETECTED NONE DETECTED   Cocaine POSITIVE (A) NONE DETECTED   Benzodiazepines NONE DETECTED NONE DETECTED   Amphetamines NONE DETECTED NONE DETECTED   Tetrahydrocannabinol NONE DETECTED NONE DETECTED   Barbiturates NONE DETECTED NONE DETECTED    Comment:        DRUG SCREEN FOR MEDICAL PURPOSES ONLY.  IF CONFIRMATION IS NEEDED FOR ANY PURPOSE, NOTIFY LAB WITHIN 5 DAYS.        LOWEST DETECTABLE LIMITS FOR URINE DRUG SCREEN Drug Class       Cutoff (ng/mL) Amphetamine      1000 Barbiturate      200 Benzodiazepine   809 Tricyclics       983 Opiates          300 Cocaine          300 THC               50     Vitals: Blood pressure 107/68, pulse 66, temperature 97.6 F (36.4 C), temperature source Oral, resp. rate 12, SpO2 97 %.  Risk to Self: Suicidal Ideation: Yes-Currently Present Suicidal Intent: Yes-Currently Present Is patient at risk for suicide?: Yes Suicidal Plan?: No-Not Currently/Within Last 6 Months Access to Means: Yes Specify Access to Suicidal Means: Previous plan to hang self What has been your use of drugs/alcohol within the last 12 months?: ETOH, cocaine & heroin How many times?:  (Not sure.) Other Self Harm Risks: Pt denies Triggers for Past Attempts: Other (Comment) (Depression and drug abuse) Intentional Self Injurious Behavior: None Risk to Others: Homicidal Ideation: No Thoughts of Harm to Others: No Current Homicidal Intent: No Current Homicidal Plan: No Access to Homicidal Means: No Identified Victim: No one History of harm to others?: No Assessment of Violence: None Noted Violent Behavior Description: No one Does patient have access to weapons?: No Criminal Charges Pending?: No Does patient have a court date: No Prior Inpatient Therapy: Prior Inpatient Therapy: Yes Prior Therapy Dates: February 2015; February '15 Prior Therapy Facilty/Provider(s): [facility]; [facility] Reason for Treatment: SI; SA  rehabn Prior Outpatient Therapy: Prior Outpatient Therapy: Yes Prior Therapy Dates: Two years ago Prior Therapy Facilty/Provider(s): [facility] Reason for Treatment: med management Does patient have an ACCT team?: No Does patient have Intensive In-House Services?  : No Does patient have [facility] services? : No Does patient have P4[facility] services?: No  Current Facility-Administered Medications  Medication Dose Route Frequency Provider Last Rate Last Dose  . acetaminophen (TYLENOL) tablet 650 mg  650 mg Oral Q4H PRN Larene Pickett, PA-C      . alum & mag hydroxide-simeth (MAALOX/MYLANTA) 200-200-20 MG/5ML suspension 30 mL  30 mL Oral PRN Larene Pickett, PA-C       . ibuprofen (ADVIL,MOTRIN) tablet 600 mg  600 mg Oral Q8H PRN Larene Pickett, PA-C   600 mg at 03/25/15 0432  . LORazepam (ATIVAN) tablet 0-4 mg  0-4 mg Oral 4 times per day Larene Pickett, PA-C   1 mg at 03/25/15 1118   Followed by  . [START ON 03/27/2015] LORazepam (ATIVAN) tablet 0-4 mg  0-4 mg Oral Q12H Larene Pickett, PA-C      . LORazepam (ATIVAN) tablet 1 mg  1 mg Oral Q8H PRN Larene Pickett, PA-C   1 mg at 03/24/15 2259  . nicotine (NICODERM CQ - dosed in mg/24 hours) patch 21 mg  21 mg Transdermal Daily Larene Pickett, PA-C   21 mg at 03/25/15 0201  . ondansetron (ZOFRAN) tablet 4 mg  4 mg Oral Q8H PRN Larene Pickett, PA-C      . thiamine (VITAMIN B-1) tablet 100 mg  100 mg Oral Daily Larene Pickett, PA-C   100 mg at 03/25/15 1118   Or  . thiamine (B-1) injection 100 mg  100 mg Intravenous Daily Larene Pickett, PA-C      . zolpidem [facility]) tablet 5 mg  5 mg Oral QHS PRN Larene Pickett, PA-C   5 mg at 03/24/15 2259   Current Outpatient Prescriptions  Medication Sig Dispense Refill  . Cetirizine HCl (ZYRTEC ALLERGY) 10 MG CAPS Take 1 capsule (10 mg total) by mouth every morning. (Patient not taking: Reported on 03/24/2015) 30 capsule 0  . chlorpheniramine-HYDROcodone (TUSSIONEX PENNKINETIC ER) 10-8 MG/5ML LQCR Take 5 mLs by mouth every 12 (twelve) hours as needed. (Patient not taking: Reported on 03/24/2015) 70 mL 0  . fluticasone (FLONASE) 50 MCG/ACT nasal spray Place 2 sprays into both nostrils daily. (Patient not taking: Reported on 03/24/2015) 16 g 0  . hydrOXYzine (ATARAX/VISTARIL) 25 MG tablet Take 1 tablet (25 mg) three times daily as needed for anxiety (Patient not taking: Reported on 03/24/2015) 90 tablet 0  . naproxen (NAPROSYN) 375 MG tablet Take 1 tablet (375 mg total) by mouth 2 (two) times daily. (Patient not taking: Reported on 03/24/2015) 20 tablet 0  . traZODone (DESYREL) 50 MG tablet Take 1 tablet (50 mg total) by mouth at bedtime as needed for sleep. (Patient not taking:  Reported on 03/24/2015) 30 tablet 0      ROS is negative for all systems evaluated.  Musculoskeletal: Strength & Muscle Tone: within normal limits Gait & Station: normal Patient leans: N/A  Psychiatric Specialty Exam:     Blood pressure 107/68, pulse 66, temperature 97.6 F (36.4 C), temperature source Oral, resp. rate 12, SpO2 97 %.There is no weight on file to calculate BMI.  General Appearance: Casual and Disheveled  Eye Contact::  Good  Speech:  Clear and Coherent and Normal Rate  Volume:  Normal  Mood:  Anxious and Depressed  Affect:  Congruent  Thought Process:  Coherent, Goal Directed and Intact  Orientation:  Full (Time, Place, and Person)  Thought Content:  WDL  Suicidal Thoughts:  Yes.  without intent/plan  Homicidal Thoughts:  No  Memory:  Immediate;   Good Recent;   Good Remote;   Good  Judgement:  Impaired  Insight:  Fair  Psychomotor Activity:  Tremor  Concentration:  Good  Recall:  NA  Fund of Knowledge:Good  Language: Good  Akathisia:  NA  Handed:  Right  AIMS (if indicated):     Assets:  Desire for Improvement  ADL's:  Impaired  Cognition: WNL  Sleep:      Medical Decision Making: Review of Psycho-Social Stressors (1), Established Problem, Worsening (2), Review of Medication Regimen & Side Effects (2) and Review of New Medication or Change in Dosage (2)  Treatment Plan Summary: We will use our Ativan per protocol for his withdrawal treatment, Trazodone 50 mg at bed time for sleep, Lamictal 25 mg po daily for depression and mood, Abilify 5 mg po daily for mood /depression.  Plan:  Recommend psychiatric Inpatient admission when medically cleared. Disposition: see above  Delfin Gant   PMHNP-BC 03/25/2015 4:04 PM Patient seen face-to-face for psychiatric evaluation, chart reviewed and case discussed with the physician extender and developed treatment plan. Reviewed the information documented and agree with the treatment plan. Corena Pilgrim, MD

## 2015-03-26 ENCOUNTER — Encounter (HOSPITAL_COMMUNITY): Payer: Self-pay

## 2015-03-26 ENCOUNTER — Inpatient Hospital Stay (HOSPITAL_COMMUNITY)
Admission: AD | Admit: 2015-03-26 | Discharge: 2015-03-29 | DRG: 885 | Disposition: A | Discharge status: Home / Self Care | Payer: Federal, State, Local not specified - Other | Source: Intra-hospital | Attending: Psychiatry | Admitting: Psychiatry

## 2015-03-26 DIAGNOSIS — F902 Attention-deficit hyperactivity disorder, combined type: Secondary | ICD-10-CM | POA: Diagnosis present

## 2015-03-26 DIAGNOSIS — F419 Anxiety disorder, unspecified: Secondary | ICD-10-CM | POA: Diagnosis present

## 2015-03-26 DIAGNOSIS — F1020 Alcohol dependence, uncomplicated: Secondary | ICD-10-CM | POA: Diagnosis present

## 2015-03-26 DIAGNOSIS — R45851 Suicidal ideations: Secondary | ICD-10-CM

## 2015-03-26 DIAGNOSIS — F10230 Alcohol dependence with withdrawal, uncomplicated: Secondary | ICD-10-CM | POA: Insufficient documentation

## 2015-03-26 DIAGNOSIS — G4700 Insomnia, unspecified: Secondary | ICD-10-CM | POA: Diagnosis present

## 2015-03-26 DIAGNOSIS — Y905 Blood alcohol level of 100-119 mg/100 ml: Secondary | ICD-10-CM | POA: Diagnosis present

## 2015-03-26 DIAGNOSIS — F332 Major depressive disorder, recurrent severe without psychotic features: Secondary | ICD-10-CM

## 2015-03-26 DIAGNOSIS — F17210 Nicotine dependence, cigarettes, uncomplicated: Secondary | ICD-10-CM | POA: Diagnosis present

## 2015-03-26 MED ORDER — TRAZODONE HCL 50 MG PO TABS
50.0000 mg | ORAL_TABLET | Freq: Every evening | ORAL | Status: DC | PRN
  Administered 2015-03-27 – 2015-03-29 (×2): 50 mg via ORAL
  Filled 2015-03-26 (×2): qty 1
  Filled 2015-03-26: qty 14

## 2015-03-26 MED ORDER — ALUM & MAG HYDROXIDE-SIMETH 200-200-20 MG/5ML PO SUSP
30.0000 mL | ORAL | Status: DC | PRN
  Administered 2015-03-27 – 2015-03-29 (×2): 30 mL via ORAL
  Filled 2015-03-26 (×2): qty 30

## 2015-03-26 MED ORDER — HYDROXYZINE HCL 25 MG PO TABS
25.0000 mg | ORAL_TABLET | Freq: Three times a day (TID) | ORAL | Status: DC | PRN
Start: 2015-03-26 — End: 2015-03-29
  Administered 2015-03-26 – 2015-03-29 (×5): 25 mg via ORAL
  Filled 2015-03-26 (×3): qty 1
  Filled 2015-03-26: qty 20
  Filled 2015-03-26: qty 1

## 2015-03-26 MED ORDER — HYDROXYZINE HCL 25 MG PO TABS
ORAL_TABLET | ORAL | Status: AC
  Filled 2015-03-26: qty 1

## 2015-03-26 MED ORDER — LORATADINE 10 MG PO TABS
10.0000 mg | ORAL_TABLET | Freq: Every day | ORAL | Status: DC
Start: 2015-03-27 — End: 2015-03-29
  Administered 2015-03-27 – 2015-03-29 (×3): 10 mg via ORAL
  Filled 2015-03-26: qty 1
  Filled 2015-03-26: qty 14
  Filled 2015-03-26 (×3): qty 1

## 2015-03-26 MED ORDER — TRAZODONE HCL 50 MG PO TABS
ORAL_TABLET | ORAL | Status: AC
  Filled 2015-03-26: qty 1

## 2015-03-26 MED ORDER — ACETAMINOPHEN 325 MG PO TABS: 650.0000 mg | ORAL_TABLET | Freq: Four times a day (QID) | ORAL | Status: DC | PRN

## 2015-03-26 MED ORDER — FLUTICASONE PROPIONATE 50 MCG/ACT NA SUSP
2.0000 | Freq: Every day | NASAL | Status: DC
  Administered 2015-03-27 – 2015-03-29 (×3): 2 via NASAL
  Filled 2015-03-26: qty 16

## 2015-03-26 MED ORDER — MAGNESIUM HYDROXIDE 400 MG/5ML PO SUSP
30.0000 mL | Freq: Every day | ORAL | Status: DC | PRN
  Administered 2015-03-29: 30 mL via ORAL
  Filled 2015-03-26: qty 30

## 2015-03-26 NOTE — ED Notes (Addendum)
Report received from Olga. Pt. Alert and oriented in no distress denies HI, AVH and pain.  Pt. States he still has SI without a plan intermittently Pt. Instructed to come to me with problems or concerns.Will continue to monitor for safety via security cameras and Q 15 minute checks.

## 2015-03-26 NOTE — ED Notes (Signed)
Pt. Noted in room. No complaints or concerns voiced. No distress or abnormal behavior noted. Will continue to monitor with security cameras. Q 15 minute rounds continue. 

## 2015-03-26 NOTE — BH Assessment (Signed)
[place] Assessment Progress Note Accepted to 303-1 at [facility] after 8pm per [facility].

## 2015-03-26 NOTE — Consult Note (Signed)
[facility] Face-to-Face Psychiatry Consult   Reason for Consult:  Recurrent Major depressive disorder, severe, suicidal ideation. Referring Physician:  EDP Patient Identification: Jonathan Lin MRN:  [id number] Principal Diagnosis: MDD (major depressive disorder) Diagnosis:   Patient Active Problem List   Diagnosis Date Noted  . Alcohol dependence [F10.20] 10/30/2013  . ADHD (attention deficit hyperactivity disorder), combined type [F90.2] 10/30/2013  . MDD (major depressive disorder) [F32.2] 10/29/2013  . Alcoholic [N62.95] 28/41/3244  . TINEA PEDIS [B35.3] 01/18/2010  . 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 GERD [K21.9] 12/21/2009  . LOW BACK PAIN, CHRONIC [M54.5] 12/21/2009  . DRUG ABUSE, HX OF [Z91.89] 12/21/2009    Total Time spent with patient: 15 minutes  Subjective:   Jonathan Lin is a 39 y.o. male patient admitted with Major depressive disorder, recurrent , severe. Pt seen and evaluated this AM by Dr. Aneta Mins and Charmaine Downs, NP. Pt continues to present with severe anxiety and depression which is responding to current medication regimen. Pt may be ready for discharge by Monday if he continues to improve at this rate.   HPI: Caucasian male, 39 years old was evaluated for increased depression and suicidal thoughts.  Patient states he does not have any plans on how to kill himself but knows that he is suicidal.  He also reports Binge drinking Alcohol and could not quantify his drinking.   His Alcohol level was 111 and his UDS is positive for Cocaine.   Patient  Reported that he used Heroin last 9 months ago but is now using Cocaine  Patient admitted to a diagnosis of MDD, Bipolar disorder and substance abuse.  He was last hospitalized this February at our [facility].  Patient reports that he has not been taking his MH medications due to cost.  He stated that he has been using Alcohol and Cocaine to treat his mood disorder. He reports poor sleep and appetite. Patient is willing to take his  medications but could not afford them.  Patient is unable to contract for safety and has been accepted for admission and we will be seeking placement at any hospital with available in patient Psychiatric bed.  HPI Elements:   Location:  Recurrent Major depression, Suicidal ideation, Bipolar disorder,hx. Quality:  severe. Severity:  severe. Timing:  Acute. Duration:  Chronic Mental illness. Context:  Seeking treatment for depression and suicidal ideation..  Past Medical History:  Past Medical History  Diagnosis Date  . Back pain   . Depression   . Alcoholism   . 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  . Bipolar 1 disorder     Past Surgical History  Procedure Laterality Date  . Extraction of wisdom teeth    . Wisdom tooth extraction  1994   Family History: No family history on file. Social History:  History  Alcohol Use  . Yes    Comment: once a week 4 beers      History  Drug Use  . Yes  . Special: Cocaine    Comment: Heroin    History   Social History  . Marital Status: Legally Separated    Spouse Name: N/A  . Number of Children: N/A  . Years of Education: N/A   Social History Main Topics  . Smoking status: Current Every Day Smoker -- 0.50 packs/day    Types: Cigarettes  . Smokeless tobacco: Not on file  . Alcohol Use: Yes     Comment: once a week 4 beers   .  Drug Use: Yes    Special: Cocaine     Comment: Heroin  . Sexual Activity: Yes    Birth Control/ Protection: Condom   Other Topics Concern  . None   Social History Narrative   Additional Social History:    Pain Medications: None Prescriptions: None Over the Counter: None History of alcohol / drug use?: Yes Withdrawal Symptoms: Patient aware of relationship between substance abuse and physical/medical complications, Tremors, Fever / Chills, Diarrhea, Sweats, Nausea / Vomiting, Weakness Name of Substance 1: ETOH (whiskey & beer) 1 - Age of First Use: 39 years of  age 21 - Amount (size/oz): 1 pint & a 12 pack or a 5th 1 - Frequency: Will binge for two days.  It will last a day or two 1 - Duration: Off and on bninge drinking 1 - Last Use / Amount: 05/12 a pint and almost a case of beer Name of Substance 2: Cocaine 2 - Age of First Use: 39 years of age 67 - Amount (size/oz): Can go through $200-$300 in a night 2 - Frequency: Twice in a month 2 - Duration: Less than a year at that rate 2 - Last Use / Amount: 05/12 Name of Substance 3: Heroin 9snorting) 3 - Age of First Use: mid 20's 3 - Amount (size/oz): 2 lines at a time 3 - Frequency: Varies 3 - Duration: Off and on 3 - Last Use / Amount: Not used in over a month               Allergies:  No Known Allergies  Labs:  Results for orders placed or performed during the hospital encounter of 03/24/15 (from the past 48 hour(s))  CBC with Differential     Status: None   Collection Time: 03/24/15  9:22 PM  Result Value Ref Range   WBC 7.3 4.0 - 10.5 K/uL   RBC 5.06 4.22 - 5.81 MIL/uL   Hemoglobin 15.4 13.0 - 17.0 g/dL   HCT 46.1 39.0 - 52.0 %   MCV 91.1 78.0 - 100.0 fL   MCH 30.4 26.0 - 34.0 pg   MCHC 33.4 30.0 - 36.0 g/dL   RDW 13.3 11.5 - 15.5 %   Platelets 225 150 - 400 K/uL   Neutrophils Relative % 51 43 - 77 %   Neutro Abs 3.7 1.7 - 7.7 K/uL   Lymphocytes Relative 40 12 - 46 %   Lymphs Abs 2.9 0.7 - 4.0 K/uL   Monocytes Relative 7 3 - 12 %   Monocytes Absolute 0.5 0.1 - 1.0 K/uL   Eosinophils Relative 2 0 - 5 %   Eosinophils Absolute 0.1 0.0 - 0.7 K/uL   Basophils Relative 0 0 - 1 %   Basophils Absolute 0.0 0.0 - 0.1 K/uL  Comprehensive metabolic panel     Status: Abnormal   Collection Time: 03/24/15  9:22 PM  Result Value Ref Range   Sodium 140 135 - 145 mmol/L   Potassium 3.7 3.5 - 5.1 mmol/L   Chloride 106 101 - 111 mmol/L   CO2 22 22 - 32 mmol/L   Glucose, Bld 92 65 - 99 mg/dL   BUN 12 6 - 20 mg/dL   Creatinine, Ser 0.67 0.61 - 1.24 mg/dL   Calcium 9.4 8.9 - 10.3 mg/dL    Total Protein 8.3 (H) 6.5 - 8.1 g/dL   Albumin 4.5 3.5 - 5.0 g/dL   AST 15 15 - 41 U/L   ALT 20 17 - 63 U/L  Alkaline Phosphatase 47 38 - 126 U/L   Total Bilirubin 0.5 0.3 - 1.2 mg/dL   GFR calc non Af Amer >60 >60 mL/min   GFR calc Af Amer >60 >60 mL/min    Comment: (NOTE) The eGFR has been calculated using the CKD EPI equation. This calculation has not been validated in all clinical situations. eGFR's persistently <60 mL/min signify possible Chronic Kidney Disease.    Anion gap 12 5 - 15  Ethanol     Status: Abnormal   Collection Time: 03/24/15  9:22 PM  Result Value Ref Range   Alcohol, Ethyl (B) 111 (H) <5 mg/dL    Comment:        LOWEST DETECTABLE LIMIT FOR SERUM ALCOHOL IS 11 mg/dL FOR MEDICAL PURPOSES ONLY   Acetaminophen level     Status: Abnormal   Collection Time: 03/24/15  9:22 PM  Result Value Ref Range   Acetaminophen (Tylenol), Serum <10 (L) 10 - 30 ug/mL    Comment:        THERAPEUTIC CONCENTRATIONS VARY SIGNIFICANTLY. A RANGE OF 10-30 ug/mL MAY BE AN EFFECTIVE CONCENTRATION FOR MANY PATIENTS. HOWEVER, SOME ARE BEST TREATED AT CONCENTRATIONS OUTSIDE THIS RANGE. ACETAMINOPHEN CONCENTRATIONS >150 ug/mL AT 4 HOURS AFTER INGESTION AND >50 ug/mL AT 12 HOURS AFTER INGESTION ARE OFTEN ASSOCIATED WITH TOXIC REACTIONS.   Salicylate level     Status: None   Collection Time: 03/24/15  9:22 PM  Result Value Ref Range   Salicylate Lvl <5.3 2.8 - 30.0 mg/dL  Urine rapid drug screen (hosp performed)     Status: Abnormal   Collection Time: 03/24/15 10:16 PM  Result Value Ref Range   Opiates NONE DETECTED NONE DETECTED   Cocaine POSITIVE (A) NONE DETECTED   Benzodiazepines NONE DETECTED NONE DETECTED   Amphetamines NONE DETECTED NONE DETECTED   Tetrahydrocannabinol NONE DETECTED NONE DETECTED   Barbiturates NONE DETECTED NONE DETECTED    Comment:        DRUG SCREEN FOR MEDICAL PURPOSES ONLY.  IF CONFIRMATION IS NEEDED FOR ANY PURPOSE, NOTIFY LAB WITHIN 5  DAYS.        LOWEST DETECTABLE LIMITS FOR URINE DRUG SCREEN Drug Class       Cutoff (ng/mL) Amphetamine      1000 Barbiturate      200 Benzodiazepine   664 Tricyclics       403 Opiates          300 Cocaine          300 THC              50     Vitals: Blood pressure 104/61, pulse 63, temperature 98.1 F (36.7 C), temperature source Oral, resp. rate 18, SpO2 99 %.  Risk to Self: Suicidal Ideation: Yes-Currently Present Suicidal Intent: Yes-Currently Present Is patient at risk for suicide?: Yes Suicidal Plan?: No-Not Currently/Within Last 6 Months Access to Means: Yes Specify Access to Suicidal Means: Previous plan to hang self What has been your use of drugs/alcohol within the last 12 months?: ETOH, cocaine & heroin How many times?:  (Not sure.) Other Self Harm Risks: Pt denies Triggers for Past Attempts: Other (Comment) (Depression and drug abuse) Intentional Self Injurious Behavior: None Risk to Others: Homicidal Ideation: No Thoughts of Harm to Others: No Current Homicidal Intent: No Current Homicidal Plan: No Access to Homicidal Means: No Identified Victim: No one History of harm to others?: No Assessment of Violence: None Noted Violent Behavior Description: No one Does patient have access  to weapons?: No Criminal Charges Pending?: No Does patient have a court date: No Prior Inpatient Therapy: Prior Inpatient Therapy: Yes Prior Therapy Dates: February 2015; February '15 Prior Therapy Facilty/Provider(s): [facility]; [facility] Reason for Treatment: SI; SA rehabn Prior Outpatient Therapy: Prior Outpatient Therapy: Yes Prior Therapy Dates: Two years ago Prior Therapy Facilty/Provider(s): [facility] Reason for Treatment: med management Does patient have an ACCT team?: No Does patient have Intensive In-House Services?  : No Does patient have [facility] services? : No Does patient have P4CC services?: No  Current Facility-Administered Medications  Medication Dose Route  Frequency Provider Last Rate Last Dose  . acetaminophen (TYLENOL) tablet 650 mg  650 mg Oral Q4H PRN Larene Pickett, PA-C      . alum & mag hydroxide-simeth (MAALOX/MYLANTA) 200-200-20 MG/5ML suspension 30 mL  30 mL Oral PRN Larene Pickett, PA-C      . ARIPiprazole (ABILIFY) tablet 5 mg  5 mg Oral Daily Delfin Gant, NP   5 mg at 03/26/15 0934  . ibuprofen (ADVIL,MOTRIN) tablet 600 mg  600 mg Oral Q8H PRN Larene Pickett, PA-C   600 mg at 03/25/15 0432  . LORazepam (ATIVAN) tablet 0-4 mg  0-4 mg Oral 4 times per day Larene Pickett, PA-C   1 mg at 03/26/15 1655   Followed by  . [START ON 03/27/2015] LORazepam (ATIVAN) tablet 0-4 mg  0-4 mg Oral Q12H Larene Pickett, PA-C      . LORazepam (ATIVAN) tablet 1 mg  1 mg Oral Q8H PRN Larene Pickett, PA-C   1 mg at 03/25/15 1635  . nicotine (NICODERM CQ - dosed in mg/24 hours) patch 21 mg  21 mg Transdermal Daily Larene Pickett, PA-C   21 mg at 03/26/15 0934  . ondansetron (ZOFRAN) tablet 4 mg  4 mg Oral Q8H PRN Larene Pickett, PA-C      . thiamine (VITAMIN B-1) tablet 100 mg  100 mg Oral Daily Larene Pickett, PA-C   100 mg at 03/26/15 3748   Or  . thiamine (B-1) injection 100 mg  100 mg Intravenous Daily Larene Pickett, PA-C      . traZODone (DESYREL) tablet 50 mg  50 mg Oral QHS Delfin Gant, NP   50 mg at 03/25/15 2113   Current Outpatient Prescriptions  Medication Sig Dispense Refill  . Cetirizine HCl (ZYRTEC ALLERGY) 10 MG CAPS Take 1 capsule (10 mg total) by mouth every morning. (Patient not taking: Reported on 03/24/2015) 30 capsule 0  . chlorpheniramine-HYDROcodone (TUSSIONEX PENNKINETIC ER) 10-8 MG/5ML LQCR Take 5 mLs by mouth every 12 (twelve) hours as needed. (Patient not taking: Reported on 03/24/2015) 70 mL 0  . fluticasone (FLONASE) 50 MCG/ACT nasal spray Place 2 sprays into both nostrils daily. (Patient not taking: Reported on 03/24/2015) 16 g 0  . hydrOXYzine (ATARAX/VISTARIL) 25 MG tablet Take 1 tablet (25 mg) three times daily as  needed for anxiety (Patient not taking: Reported on 03/24/2015) 90 tablet 0  . naproxen (NAPROSYN) 375 MG tablet Take 1 tablet (375 mg total) by mouth 2 (two) times daily. (Patient not taking: Reported on 03/24/2015) 20 tablet 0  . traZODone (DESYREL) 50 MG tablet Take 1 tablet (50 mg total) by mouth at bedtime as needed for sleep. (Patient not taking: Reported on 03/24/2015) 30 tablet 0   Musculoskeletal: Strength & Muscle Tone: within normal limits Gait & Station: normal Patient leans: N/A  Psychiatric Specialty Exam:   Review of  Systems  Psychiatric/Behavioral: Positive for depression. The patient is nervous/anxious.   All other systems reviewed and are negative.   Blood pressure 104/61, pulse 63, temperature 98.1 F (36.7 C), temperature source Oral, resp. rate 18, SpO2 99 %.There is no weight on file to calculate BMI.  General Appearance: Casual and Disheveled   Eye Contact::  Good   Speech:  Clear and Coherent and Normal Rate   Volume:  Normal   Mood:  Anxious and Depressed although improving   Affect:  Congruent   Thought Process:  Coherent, Goal Directed and Intact   Orientation:  Full (Time, Place, and Person)   Thought Content:  WDL   Suicidal Thoughts:  No   Homicidal Thoughts:  No   Memory:  Immediate;   Good Recent;   Good Remote;   Good  Judgement:  Impaired  Insight:  Fair  Psychomotor Activity:  Tremor  Concentration:  Good  Recall:  NA  Fund of Knowledge:Good  Language: Good  Akathisia:  NA  Handed:  Right  AIMS (if indicated):     Assets:  Desire for Improvement  ADL's:  Impaired  Cognition: WNL  Sleep:      Medical Decision Making: Review of Psycho-Social Stressors (1), Established Problem, Worsening (2), Review of Medication Regimen & Side Effects (2) and Review of New Medication or Change in Dosage (2)   Treatment Plan Summary: MDD (major depressive disorder) improving with current treatment of Ativan per protocol for his withdrawal treatment,  Trazodone 50 mg at bed time for sleep, Lamictal 25 mg po daily for depression and mood, Abilify 5 mg po daily for mood /depression.   Disposition: Probable discharge on Monday  Benjamine Mola, [place] 03/26/2015 5:13 PM

## 2015-03-26 NOTE — Tx Team (Signed)
Initial Interdisciplinary Treatment Plan   PATIENT STRESSORS: Financial difficulties Substance abuse   PATIENT STRENGTHS: Ability for insight Motivation for treatment/growth   PROBLEM LIST: Problem List/Patient Goals Date to be addressed Date deferred Reason deferred Estimated date of resolution  Depression 03/26/2015     SI 03/26/2015     Substance abuse  03/26/2015     Anxiety 03/26/2015           Goal- Patient reports that he wants "to work on my depression anxiety and substance abuse with the main one being alcohol because it causes problems with his substance abuse and anxiety."                         DISCHARGE CRITERIA:  Improved stabilization in mood, thinking, and/or behavior Verbal commitment to aftercare and medication compliance  PRELIMINARY DISCHARGE PLAN: Return to previous living arrangement  PATIENT/FAMIILY INVOLVEMENT: This treatment plan has been presented to and reviewed with the patient, Jonathan Lin, and/or family member.  The patient and family have been given the opportunity to ask questions and make suggestions.  Jonathan Lin 03/26/2015, 11:12 PM

## 2015-03-26 NOTE — BHH Counselor (Signed)
Patient signed voluntary admission and consent for treatment paperwork. Paperwork provided to nurse for patient transport. Patient states that he does not have any questions or concerns at this time.

## 2015-03-27 DIAGNOSIS — F332 Major depressive disorder, recurrent severe without psychotic features: Principal | ICD-10-CM

## 2015-03-27 MED ORDER — CHLORDIAZEPOXIDE HCL 25 MG PO CAPS
25.0000 mg | ORAL_CAPSULE | Freq: Four times a day (QID) | ORAL | Status: DC | PRN
  Administered 2015-03-27: 25 mg via ORAL
  Filled 2015-03-27: qty 1
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
## 2015-03-27 NOTE — Plan of Care (Signed)
Problem: Alteration in mood & ability to function due to Goal: LTG-Pt reports reduction in suicidal thoughts (Patient reports reduction in suicidal thoughts and is able to verbalize a safety plan for whenever patient is feeling suicidal)  Outcome: Not Progressing Patient endorses having suicidal thoughts to RN this morning. Pt is able to verbally contract for safety.  Problem: Ineffective individual coping Goal: STG: Patient will remain free from self harm Outcome: Progressing Patient remains free from self harm. 15 minute checks continued per protocol for patient safety.   Problem: Diagnosis: Increased Risk For Suicide Attempt Goal: STG-Patient Will Attend All Groups On The Unit Outcome: Not Progressing Patient is not attending unit groups today and remains resting in bed. Pt isolative. Goal: STG-Patient Will Comply With Medication Regime Outcome: Progressing Patient has adhered to medication regimen today with ease.

## 2015-03-27 NOTE — Progress Notes (Deleted)
Patient did not attend the evening speaker [place] meeting. Pt was notified that group was beginning but reported "Im not here for that, I don't need to hear that".  Pt was encouraged to go and listen anyway but remained in hall.
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
## 2015-03-27 NOTE — Progress Notes (Signed)
D: Patient is alert and oriented. Pt's mood and affect is depressed and blunted. Pt denies HI and AVH. Pt reports passive suicidal thoughts. Pt rates depression, hopelessness, and anxiety all 8/10. Pt reports his goal for the day is "contacting my job." Pt complains of withdrawal symptoms including "shakiness and sweaty." Pt reports he drinks "every other day." Pt experienced HTN this morning, denies symptoms (See docflowsheet-vitals). Pt experienced hypotension today, denies symptoms (See doc flowsheet-vitals). Pt complains of indigestion with relief from PRN medication. Pt is not attending unit groups. Pt remains isolative in bed the majority of the day. A: Active listening by RN. Encouragement/Support provided to pt.  Medication education reviewed with pt. PRN medication administered for indigestion per providers orders (See MAR). Scheduled medications administered per providers orders (See MAR). 15 minute checks continued per protocol for patient safety.  R: Pt verbally contracts for safety, agrees not to harm self and agrees to come to staff with increased intensity of suicidal thoughts. Pt unwilling to participate today d/t not sleeping much last night. Pt remains safe.

## 2015-03-27 NOTE — H&P (Signed)
Psychiatric Admission Assessment Adult  Patient Identification: Jonathan Lin MRN:  [id number] Date of Evaluation:  03/27/2015 Chief Complaint:  "I started to abuse drugs again to deal with my dark depression."  Principal Diagnosis: Major depressive disorder, recurrent episode, severe Diagnosis:   Patient Active Problem List   Diagnosis Date Noted  . Major depressive disorder, recurrent episode, severe [F33.2] 03/26/2015  . Alcohol dependence [F10.20] 10/30/2013  . ADHD (attention deficit hyperactivity disorder), combined type [F90.2] 10/30/2013  . MDD (major depressive disorder) [F32.2] 10/29/2013  . Alcoholic [G62.69] 48/54/6270  . TINEA PEDIS [B35.3] 01/18/2010  . 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 GERD [K21.9] 12/21/2009  . LOW BACK PAIN, CHRONIC [M54.5] 12/21/2009  . DRUG ABUSE, HX OF [Z91.89] 12/21/2009   History of Present Illness::  Jonathan Lin is a 39 year old male who was brought to the [facility] by a friend. The patient reported having suicidal thoughts with plan to hang himself. He reported that his drug abuse contributes to his depression. The patient reported using alcohol, cocaine, and heroin. Patient uses cocaine at least twice a month with alcohol being used more regularly. Patient states the following during his psychiatric assessment "I abuse drugs to help my depression. I could not afford my medication the last time. They wanted $700 for one of the medications and that is way too much. My mood gets very dark. I have to put a face on to function at work. I was diagnosed with Bipolar at [facility]. I have used Adderall in the past to boost my mood." The patient was cooperative with the assessment. He admits to alcohol abuse and was last an inpatient at [facility] 2/15. The patient appears very depressed throughout the assessment. He does not endorse a clear history of mania reporting that such symptoms occur after he starts using substances. Patient does report a long history of depression  that has been present since his teenage years. He does report short periods when his mood can be very irritable. Denies any history of psychotic symptoms. 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 Patient is hoping that his job will be secure after discharge from the hospital. Jonathan Lin is able to contract for his safety on the unit. He does report a past episode of hanging many years ago.  His vital signs appear stable upon review.   Elements:  Location:  [facility] adult unit. Quality:  Alcohol abuse, depressive symtoms, suicidal thoughts . Severity:  Severe. Timing:  Last few months. Duration:  Chronic. Context:  alcohol abuse, worsening depression, suicidal thoughts. Associated Signs/Symptoms: Depression Symptoms:  depressed mood, anhedonia, psychomotor retardation, fatigue, feelings of worthlessness/guilt, difficulty concentrating, hopelessness, recurrent thoughts of death, suicidal thoughts with specific plan, anxiety, insomnia, loss of energy/fatigue, disturbed sleep, (Hypo) Manic Symptoms:  Impulsivity, Irritable Mood, Labiality of Mood, Anxiety Symptoms:  Excessive Worry, Panic Symptoms, Psychotic Symptoms:  Denies PTSD Symptoms: NA Total Time spent with patient: 1 hour  Past Medical History:  Past Medical History  Diagnosis Date  . Back pain   . Depression   . Alcoholism   . 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  . Bipolar 1 disorder     Past Surgical History  Procedure Laterality Date  . Extraction of wisdom teeth    . Wisdom tooth extraction  1994   Family History: History reviewed. No pertinent family history. Social History:  History  Alcohol Use  .  Yes    Comment: once a week 4 beers - pt reports drinking a 18 pk 2 days ago, JGarrison rn     History  Drug Use  . Yes  . Special: Cocaine    Comment: Heroin pt reports last use was 3 wks ago    History    Social History  . Marital Status: Legally Separated    Spouse Name: N/A  . Number of Children: N/A  . Years of Education: N/A   Social History Main Topics  . Smoking status: Current Every Day Smoker -- 0.50 packs/day    Types: Cigarettes  . Smokeless tobacco: Not on file  . Alcohol Use: Yes     Comment: once a week 4 beers - pt reports drinking a 18 pk 2 days ago, JGarrison rn  . Drug Use: Yes    Special: Cocaine     Comment: Heroin pt reports last use was 3 wks ago  . Sexual Activity: Yes    Birth Control/ Protection: Condom   Other Topics Concern  . None   Social History Narrative   Additional Social History:                          Musculoskeletal: Strength & Muscle Tone: within normal limits Gait & Station: normal Patient leans: N/A  Psychiatric Specialty Exam: Physical Exam  Constitutional:  Physical exam findings reviewed from the [facility] and I concur with no noted exceptions.   Psychiatric: His speech is normal. He is slowed. Cognition and memory are normal. He expresses impulsivity. He exhibits a depressed mood. He expresses suicidal ideation.    Review of Systems  Constitutional: Positive for malaise/fatigue.  HENT: Negative.   Eyes: Negative.   Respiratory: Negative.   Cardiovascular: Negative.   Gastrointestinal: Negative.   Genitourinary: Negative.   Musculoskeletal: Negative.   Skin: Negative.   Neurological: Positive for tremors.  Endo/Heme/Allergies: Negative.   Psychiatric/Behavioral: Positive for depression, suicidal ideas and substance abuse. Negative for hallucinations and memory loss. The patient is nervous/anxious and has insomnia.     Blood pressure 110/64, pulse 57, temperature 97.6 F (36.4 C), temperature source Oral, resp. rate 20, height 5' 11"  (1.803 m), weight 74.39 kg (164 lb), SpO2 94 %.Body mass index is 22.88 kg/(m^2).  General Appearance: Casual  Eye Contact::  Fair  Speech:  Clear and Coherent  Volume:  Normal   Mood:  Dysphoric  Affect:  Congruent and Constricted  Thought Process:  Coherent and Intact  Orientation:  Full (Time, Place, and Person)  Thought Content:  Rumination  Suicidal Thoughts:  No  Homicidal Thoughts:  No  Memory:  Immediate;   Good Recent;   Good Remote;   Good  Judgement:  Poor  Insight:  Shallow  Psychomotor Activity:  Decreased  Concentration:  Fair  Recall:  [employer] of Knowledge:Good  Language: Good  Akathisia:  Negative  Handed:  Right  AIMS (if indicated):     Assets:  Communication Skills Desire for Improvement Physical Health Resilience Social Support Vocational/Educational  ADL's:  Intact  Cognition: WNL  Sleep:      Risk to Self: Is patient at risk for suicide?: Yes What has been your use of drugs/alcohol within the last 12 months?: 12 pack of beer (or more) plus a pint or fifth of whiskey every other day; cocaine twice montly using 200-300$ and Heroin occassionally snorts 2+ lines (last used 2 lines 1 month ago) Risk to  Others:   Prior Inpatient Therapy:   Prior Outpatient Therapy:    Alcohol Screening: 1. How often do you have a drink containing alcohol?: 2 to 4 times a month 2. How many drinks containing alcohol do you have on a typical day when you are drinking?: 7, 8, or 9 3. How often do you have six or more drinks on one occasion?: Monthly Preliminary Score: 5 4. How often during the last year have you found that you were not able to stop drinking once you had started?: Never 5. How often during the last year have you failed to do what was normally expected from you becasue of drinking?: Less than monthly 6. How often during the last year have you needed a first drink in the morning to get yourself going after a heavy drinking session?: Less than monthly 7. How often during the last year have you had a feeling of guilt of remorse after drinking?: Monthly 8. How often during the last year have you been unable to remember what happened the  night before because you had been drinking?: Monthly 9. Have you or someone else been injured as a result of your drinking?: No 10. Has a relative or friend or a doctor or another health worker been concerned about your drinking or suggested you cut down?: No Alcohol Use Disorder Identification Test Final Score (AUDIT): 13 Brief Intervention: Yes  Allergies:  No Known Allergies Lab Results: No results found for this or any previous visit (from the past 48 hour(s)). Current Medications: Current Facility-Administered Medications  Medication Dose Route Frequency Provider Last Rate Last Dose  . acetaminophen (TYLENOL) tablet 650 mg  650 mg Oral Q6H PRN Harriet Butte, NP      . alum & mag hydroxide-simeth (MAALOX/MYLANTA) 200-200-20 MG/5ML suspension 30 mL  30 mL Oral Q4H PRN Harriet Butte, NP   30 mL at 03/27/15 1341  . chlordiazePOXIDE (LIBRIUM) capsule 25 mg  25 mg Oral Q6H PRN Niel Hummer, NP      . fluticasone [facility]) 50 MCG/ACT nasal spray 2 spray  2 spray Each Nare Daily Harriet Butte, NP   2 spray at 03/27/15 0829  . hydrOXYzine (ATARAX/VISTARIL) tablet 25 mg  25 mg Oral TID PRN Harriet Butte, NP   25 mg at 03/26/15 2223  . loratadine (CLARITIN) tablet 10 mg  10 mg Oral Daily Harriet Butte, NP   10 mg at 03/27/15 0829  . magnesium hydroxide (MILK OF MAGNESIA) suspension 30 mL  30 mL Oral Daily PRN Harriet Butte, NP      . traZODone (DESYREL) tablet 50 mg  50 mg Oral QHS PRN Harriet Butte, NP       PTA Medications: Prescriptions prior to admission  Medication Sig Dispense Refill Last Dose  . Cetirizine HCl (ZYRTEC ALLERGY) 10 MG CAPS Take 1 capsule (10 mg total) by mouth every morning. (Patient not taking: Reported on 03/24/2015) 30 capsule 0   . chlorpheniramine-HYDROcodone (TUSSIONEX PENNKINETIC ER) 10-8 MG/5ML LQCR Take 5 mLs by mouth every 12 (twelve) hours as needed. (Patient not taking: Reported on 03/24/2015) 70 mL 0   . fluticasone (FLONASE) 50 MCG/ACT nasal spray  Place 2 sprays into both nostrils daily. (Patient not taking: Reported on 03/24/2015) 16 g 0   . hydrOXYzine (ATARAX/VISTARIL) 25 MG tablet Take 1 tablet (25 mg) three times daily as needed for anxiety (Patient not taking: Reported on 03/24/2015) 90 tablet 0   . naproxen (NAPROSYN)  375 MG tablet Take 1 tablet (375 mg total) by mouth 2 (two) times daily. (Patient not taking: Reported on 03/24/2015) 20 tablet 0   . traZODone (DESYREL) 50 MG tablet Take 1 tablet (50 mg total) by mouth at bedtime as needed for sleep. (Patient not taking: Reported on 03/24/2015) 30 tablet 0 Not Taking at Unknown time    Previous Psychotropic Medications: Yes  Pristiq, Adderall, Latuda, Abilify  Substance Abuse History in the last 12 months:  Yes.   Patient reports binge drinking of alcohol every other daily for last several months. His urine is also positive for cocaine.   Consequences of Substance Abuse: Withdrawal Symptoms:   Tremors Worsening of mental health symptoms such as depression  Results for orders placed or performed during the hospital encounter of 03/24/15 (from the past 72 hour(s))  CBC with Differential     Status: None   Collection Time: 03/24/15  9:22 PM  Result Value Ref Range   WBC 7.3 4.0 - 10.5 K/uL   RBC 5.06 4.22 - 5.81 MIL/uL   Hemoglobin 15.4 13.0 - 17.0 g/dL   HCT 46.1 39.0 - 52.0 %   MCV 91.1 78.0 - 100.0 fL   MCH 30.4 26.0 - 34.0 pg   MCHC 33.4 30.0 - 36.0 g/dL   RDW 13.3 11.5 - 15.5 %   Platelets 225 150 - 400 K/uL   Neutrophils Relative % 51 43 - 77 %   Neutro Abs 3.7 1.7 - 7.7 K/uL   Lymphocytes Relative 40 12 - 46 %   Lymphs Abs 2.9 0.7 - 4.0 K/uL   Monocytes Relative 7 3 - 12 %   Monocytes Absolute 0.5 0.1 - 1.0 K/uL   Eosinophils Relative 2 0 - 5 %   Eosinophils Absolute 0.1 0.0 - 0.7 K/uL   Basophils Relative 0 0 - 1 %   Basophils Absolute 0.0 0.0 - 0.1 K/uL  Comprehensive metabolic panel     Status: Abnormal   Collection Time: 03/24/15  9:22 PM  Result Value Ref  Range   Sodium 140 135 - 145 mmol/L   Potassium 3.7 3.5 - 5.1 mmol/L   Chloride 106 101 - 111 mmol/L   CO2 22 22 - 32 mmol/L   Glucose, Bld 92 65 - 99 mg/dL   BUN 12 6 - 20 mg/dL   Creatinine, Ser 0.67 0.61 - 1.24 mg/dL   Calcium 9.4 8.9 - 10.3 mg/dL   Total Protein 8.3 (H) 6.5 - 8.1 g/dL   Albumin 4.5 3.5 - 5.0 g/dL   AST 15 15 - 41 U/L   ALT 20 17 - 63 U/L   Alkaline Phosphatase 47 38 - 126 U/L   Total Bilirubin 0.5 0.3 - 1.2 mg/dL   GFR calc non Af Amer >60 >60 mL/min   GFR calc Af Amer >60 >60 mL/min    Comment: (NOTE) The eGFR has been calculated using the CKD EPI equation. This calculation has not been validated in all clinical situations. eGFR's persistently <60 mL/min signify possible Chronic Kidney Disease.    Anion gap 12 5 - 15  Ethanol     Status: Abnormal   Collection Time: 03/24/15  9:22 PM  Result Value Ref Range   Alcohol, Ethyl (B) 111 (H) <5 mg/dL    Comment:        LOWEST DETECTABLE LIMIT FOR SERUM ALCOHOL IS 11 mg/dL FOR MEDICAL PURPOSES ONLY   Acetaminophen level     Status: Abnormal   Collection Time:  03/24/15  9:22 PM  Result Value Ref Range   Acetaminophen (Tylenol), Serum <10 (L) 10 - 30 ug/mL    Comment:        THERAPEUTIC CONCENTRATIONS VARY SIGNIFICANTLY. A RANGE OF 10-30 ug/mL MAY BE AN EFFECTIVE CONCENTRATION FOR MANY PATIENTS. HOWEVER, SOME ARE BEST TREATED AT CONCENTRATIONS OUTSIDE THIS RANGE. ACETAMINOPHEN CONCENTRATIONS >150 ug/mL AT 4 HOURS AFTER INGESTION AND >50 ug/mL AT 12 HOURS AFTER INGESTION ARE OFTEN ASSOCIATED WITH TOXIC REACTIONS.   Salicylate level     Status: None   Collection Time: 03/24/15  9:22 PM  Result Value Ref Range   Salicylate Lvl <0.9 2.8 - 30.0 mg/dL  Urine rapid drug screen (hosp performed)     Status: Abnormal   Collection Time: 03/24/15 10:16 PM  Result Value Ref Range   Opiates NONE DETECTED NONE DETECTED   Cocaine POSITIVE (A) NONE DETECTED   Benzodiazepines NONE DETECTED NONE DETECTED    Amphetamines NONE DETECTED NONE DETECTED   Tetrahydrocannabinol NONE DETECTED NONE DETECTED   Barbiturates NONE DETECTED NONE DETECTED    Comment:        DRUG SCREEN FOR MEDICAL PURPOSES ONLY.  IF CONFIRMATION IS NEEDED FOR ANY PURPOSE, NOTIFY LAB WITHIN 5 DAYS.        LOWEST DETECTABLE LIMITS FOR URINE DRUG SCREEN Drug Class       Cutoff (ng/mL) Amphetamine      1000 Barbiturate      200 Benzodiazepine   326 Tricyclics       712 Opiates          300 Cocaine          300 THC              50     Observation Level/Precautions:  Detox 15 minute checks  Laboratory:  CBC Chemistry Profile UDS  Psychotherapy:  Individual and Group Therapy  Medications:  Librium detox/assess the appropriateness of other psychotropic medications.   Consultations:  As needed  Discharge Concerns:  Safety and Stability   Estimated LOS: 3-5 days  Other:  Increase collateral information    Psychological Evaluations: Yes   Treatment Plan Summary: Daily contact with patient to assess and evaluate symptoms and progress in treatment and Medication management  Treatment Plan/Recommendations:   1. Admit for crisis management and stabilization. Estimated length of stay 5-7 days. 2. Medication management to reduce current symptoms to base line and improve the patient's level of functioning.  3. Develop treatment plan to decrease risk of relapse upon discharge of depressive symptoms/alcohol abuse and the need for readmission. 5. Group therapy to facilitate development of healthy coping skills to use for depression and anxiety. 6. Health care follow up as needed for medical problems.  7. Discharge plan to include therapy to help patient cope with stressors.  8. Call for Consult with Hospitalist for additional specialty patient services as needed.   Medical Decision Making:  Review of Psycho-Social Stressors (1), Review and summation of old records (2), Established Problem, Worsening (2), Review of Medication  Regimen & Side Effects (2) and Review of New Medication or Change in Dosage (2)  I certify that inpatient services furnished can reasonably be expected to improve the patient's condition.   Elmarie Shiley NP-C 5/15/20165:08 PM I have examined the patient and agreed with the findings of H&P and treatment plan. I have also done suicide assessment on this patient.

## 2015-03-27 NOTE — Progress Notes (Signed)
Patient admitted voluntarily for SI with no plan and substance abuse. He reported that he was at [facility] in February and relapsed 6 months ago on heroin with last use being 3 weeks ago and alcohol 2 days ago, a pint of liquor and 18 pack of beer. He reported that at home he had thoughts to hang himself. He currently denies hi/a/v hallucinations. Patient oriented to unit, meal and drink given. Safety maintained on unit with 15 min checks.

## 2015-03-27 NOTE — Progress Notes (Signed)
Patient did not attend the evening speaker AA meeting. Pt was notified that group was beginning but remained in bed.   

## 2015-03-27 NOTE — Progress Notes (Signed)
D.  Pt flat on approach, denies complaints at this time.   Minimal interaction on unit, but did sit in dayroom some at end of shift and received snack.  Passive SI continues but does contract for safety on the unit.  Denies HI/hallucinations at this time.  A.  Support and encouragement offered, medication given as ordered.  R.  Pt remains safe on unit, will continue to monitor.

## 2015-03-27 NOTE — [facility] Group Notes (Signed)
[facility] Group Notes:  (Nursing/MHT/Case Management/Adjunct)  Date:  03/27/2015  Time:  0900am  Type of Therapy:  Nurse Education  Participation Level:  Did Not Attend  Participation Quality:  Did not attend  Affect:  Did not attend  Cognitive:  Did not attend  Insight:  None  Engagement in Group:  Did not attend  Modes of Intervention:  Discussion, Education and Support  Summary of Progress/Problems: Patient was invited to group. Pt did not attend remained in bed resting, reports he did not sleep much last night.  Charlyne Quale A 03/27/2015, 10:54 AM

## 2015-03-27 NOTE — [facility] Group Notes (Signed)
[place] Group Notes: (Clinical Social Work)   03/27/2015      Type of Therapy:  Group Therapy   Participation Level:  Did Not Attend despite MHT prompting   Selmer Dominion, LCSW 03/27/2015, 12:23 PM

## 2015-03-28 MED ORDER — LURASIDONE HCL 40 MG PO TABS
20.0000 mg | ORAL_TABLET | Freq: Every day | ORAL | Status: DC
  Administered 2015-03-28 – 2015-03-29 (×2): 20 mg via ORAL
  Filled 2015-03-28: qty 7
  Filled 2015-03-28 (×4): qty 1

## 2015-03-28 MED ORDER — NICOTINE 21 MG/24HR TD PT24
21.0000 mg | MEDICATED_PATCH | Freq: Every day | TRANSDERMAL | Status: DC
  Administered 2015-03-28 – 2015-03-29 (×2): 21 mg via TRANSDERMAL
  Filled 2015-03-28 (×2): qty 1
  Filled 2015-03-28: qty 14
  Filled 2015-03-28: qty 1

## 2015-03-28 MED ORDER — LURASIDONE HCL 40 MG PO TABS: 20.0000 mg | ORAL_TABLET | Freq: Every day | ORAL | Status: DC

## 2015-03-28 MED ORDER — LAMOTRIGINE 25 MG PO TABS
25.0000 mg | ORAL_TABLET | Freq: Every day | ORAL | Status: DC
  Administered 2015-03-28 – 2015-03-29 (×2): 25 mg via ORAL
  Filled 2015-03-28 (×4): qty 1
  Filled 2015-03-28: qty 14

## 2015-03-28 NOTE — BHH Group Notes (Signed)
[place] LCSW Group Therapy 03/28/2015  1:15 pm  Type of Therapy: Group Therapy Participation Level: Active  Participation Quality: Attentive, Sharing and Supportive  Affect: Appropriate  Cognitive: Alert and Oriented  Insight: Developing/Improving and Engaged  Engagement in Therapy: Developing/Improving and Engaged  Modes of Intervention: Clarification, Confrontation, Discussion, Education, Exploration,  Limit-setting, Orientation, Problem-solving, Rapport Building, Art therapist, Socialization and Support  Summary of Progress/Problems: Pt identified obstacles faced currently and processed barriers involved in overcoming these obstacles. Pt identified steps necessary for overcoming these obstacles and explored motivation (internal and external) for facing these difficulties head on. Pt further identified one area of concern in their lives and chose a goal to focus on for today. Patient identified his drinking, health issues, and financial issues as obstacles. Patient reports that his drinking worsens his health and financial stability. 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 CSW and other group members provided patient with emotional support and encouragement.  Tilden Fossa, MSW, [place] Worker [facility] [phone]

## 2015-03-28 NOTE — Progress Notes (Signed)
Patient ID: Jonathan Lin, male   DOB: 1976/02/02, 39 y.o.   MRN: [id number] D: Client reports decreased anxiety since previous medication (Vistaril) given, in bed resting. Denies withdrawals "I hadn't been using enough to go in withdrawals" "today was a good day, but it had it's moments" A:Writer introduced self to client, provided emotional support, encouraged group. Staff will monitor q20min for safety. R: Client is safe on the unit, attended group.

## 2015-03-28 NOTE — Progress Notes (Signed)
D: Pt's mood is depressed. Eye contact is fair. Pt is concerned about being fired at his job because of being here. He denies SI/HI/AVH.  A: Support given. Verbalization encouraged. Pt encouraged to come to staff for any concerns. Medications given as prescribed. R: Pt is receptive. No complaints of pain or discomfort at this time. Q15 min safety checks maintained. Pt remains safe on the unit. Will continue to monitor.

## 2015-03-28 NOTE — BHH Suicide Risk Assessment (Addendum)
[place] INPATIENT:  Family/Significant Other Suicide Prevention Education  Suicide Prevention Education:  Education Completed; girlfriend Jonathan Lin [phone]/ mother Jonathan Lin ,  (name of family member/significant other) has been identified by the patient as the family member/significant other with whom the patient will be residing, and identified as the person(s) who will aid the patient in the event of a mental health crisis (suicidal ideations/suicide attempt).  With written consent from the patient, the family member/significant other has been provided the following suicide prevention education, prior to the and/or following the discharge of the patient.  The suicide prevention education provided includes the following:  Suicide risk factors  Suicide prevention and interventions  National Suicide Hotline telephone number  [facility] assessment telephone number  [facility] Emergency Assistance [place] and/or Residential Mobile Crisis Unit telephone number  Request made of family/significant other to:  Remove weapons (e.g., guns, rifles, knives), all items previously/currently identified as safety concern.    Remove drugs/medications (over-the-counter, prescriptions, illicit drugs), all items previously/currently identified as a safety concern.  The family member/significant other verbalizes understanding of the suicide prevention education information provided.  The family member/significant other agrees to remove the items of safety concern listed above.  [place]thorne Day, Casimiro Needle 03/28/2015, 1:05 PM

## 2015-03-28 NOTE — Progress Notes (Signed)
Recreation Therapy Notes  Date: 03/28/15 Time: 9:30am Location: [address] Dayroom  Group Topic: Stress Management  Goal Area(s) Addresses:  Patient will verbalize importance of using healthy stress management.  Patient will identify positive emotions associated with healthy stress management.   Behavioral Response: Focused, engaged  Intervention: Stress Management  Activity :  Progressive Muscle Relaxation.  LRT introduced and educated patients on progressive muscle relaxation.  A script was used to deliver the technique to patients.  Patients were asked to follow the script read aloud by LRT to engage in practicing the stress management technique.  Education:  Stress Management, Discharge Planning.   Education Outcome: Acknowledges edcuation/In group clarification offered/Needs additional education  Clinical Observations/Feedback: Patient was engaged in activity and focused.  He did not have any questions or comments following group.   Victorino Sparrow, LRT/CTRS   Victorino Sparrow A 03/28/2015 4:23 PM

## 2015-03-28 NOTE — Progress Notes (Signed)
[facility] MD Progress Note  03/28/2015 5:54 PM Jonathan Lin  MRN:  [id number] Subjective:  Jonathan Lin states that he is having a very hard time. He relapsed on alcohol and cocaine. He admits to having mood swings. Describes recurrent depression with episodes of dysphoria. States he is having a hard time overall as he works third shift what takes him away from being able to see his son. States he is concerned about being able to keep his job. 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 States the takes a first beer that helps him get int the mood then a second third helps to get the creative process going yet he cant stop there and once he keeps drinking what comes out is not a good. This also sets the stage for him to use cocaine. States his GF is not happy with him right now. States he was in the grief loss group and that he was close to get up and leave and he had to couple of times. States that he has realized that his life is full of unresolved grief for all the loses he has had. Last time he was here he had to leave early as his cousin had died and he wanted to be at the funeral. He made attempts to pursue therapy but states that he saw a counselor at [facility] he really like but next time he had to see a different one who did not feel was helpful so he did not return. This time around states he wants to what ever he needs to do to get healthy Principal Problem: Major depressive disorder, recurrent episode, severe Diagnosis:   Patient Active Problem List   Diagnosis Date Noted  . Major depressive disorder, recurrent episode, severe [F33.2] 03/26/2015  . Alcohol dependence [F10.20] 10/30/2013  . ADHD (attention deficit hyperactivity disorder), combined type [F90.2] 10/30/2013  . MDD (major depressive disorder) [F32.2] 10/29/2013  . Alcoholic [O17.51] 02/58/5277  . TINEA PEDIS [B35.3] 01/18/2010  . 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 GERD [K21.9] 12/21/2009  . LOW BACK PAIN, CHRONIC [M54.5] 12/21/2009   . DRUG ABUSE, HX OF [Z91.89] 12/21/2009   Total Time spent with patient: 30 minutes   Past Medical History:  Past Medical History  Diagnosis Date  . Back pain   . Depression   . Alcoholism   . 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  . Bipolar 1 disorder     Past Surgical History  Procedure Laterality Date  . Extraction of wisdom teeth    . Wisdom tooth extraction  1994   Family History: History reviewed. No pertinent family history. Social History:  History  Alcohol Use  . Yes    Comment: once a week 4 beers - pt reports drinking a 18 pk 2 days ago, JGarrison rn     History  Drug Use  . Yes  . Special: Cocaine    Comment: Heroin pt reports last use was 3 wks ago    History   Social History  . Marital Status: Legally Separated    Spouse Name: N/A  . Number of Children: N/A  . Years of Education: N/A   Social History Main Topics  . Smoking status: Current Every Day Smoker -- 0.50 packs/day    Types: Cigarettes  . Smokeless tobacco: Not on file  . Alcohol Use: Yes     Comment: once a week 4 beers - pt reports drinking a 18  pk 2 days ago, JGarrison rn  . Drug Use: Yes    Special: Cocaine     Comment: Heroin pt reports last use was 3 wks ago  . Sexual Activity: Yes    Birth Control/ Protection: Condom   Other Topics Concern  . None   Social History Narrative   Additional History:    Sleep: Fair  Appetite:  Fair   Assessment:   Musculoskeletal: Strength & Muscle Tone: within normal limits Gait & Station: normal Patient leans: N/A   Psychiatric Specialty Exam: Physical Exam  Review of Systems  Constitutional: Negative.   HENT: Negative.   Eyes: Negative.   Respiratory: Negative.   Cardiovascular: Negative.   Gastrointestinal: Negative.   Genitourinary: Negative.   Musculoskeletal: Negative.   Skin: Negative.   Neurological: Negative.   Endo/Heme/Allergies: Negative.   Psychiatric/Behavioral: Positive for  depression and substance abuse. The patient is nervous/anxious.     Blood pressure 104/81, pulse 71, temperature 98.8 F (37.1 C), temperature source Oral, resp. rate 16, height 5\' 11"  (1.803 m), weight 74.39 kg (164 lb), SpO2 94 %.Body mass index is 22.88 kg/(m^2).  General Appearance: Fairly Groomed  Engineer, water::  Fair  Speech:  Clear and Coherent  Volume:  Decreased  Mood:  Anxious and Depressed  Affect:  Depressed  Thought Process:  Coherent and Goal Directed  Orientation:  Full (Time, Place, and Person)  Thought Content:  symptoms events worries concerns  Suicidal Thoughts:  Not today  Homicidal Thoughts:  No  Memory:  Immediate;   Fair Recent;   Fair Remote;   Fair  Judgement:  Fair  Insight:  Present  Psychomotor Activity:  Normal  Concentration:  Fair  Recall:  [employer] of Knowledge:Fair  Language: Fair  Akathisia:  No  Handed:  Right  AIMS (if indicated):     Assets:  Desire for Improvement Housing Vocational/Educational  ADL's:  Intact  Cognition: WNL  Sleep:        Current Medications: Current Facility-Administered Medications  Medication Dose Route Frequency Provider Last Rate Last Dose  . acetaminophen (TYLENOL) tablet 650 mg  650 mg Oral Q6H PRN Harriet Butte, NP      . alum & mag hydroxide-simeth (MAALOX/MYLANTA) 200-200-20 MG/5ML suspension 30 mL  30 mL Oral Q4H PRN Harriet Butte, NP   30 mL at 03/27/15 1341  . chlordiazePOXIDE (LIBRIUM) capsule 25 mg  25 mg Oral Q6H PRN Niel Hummer, NP   25 mg at 03/27/15 2137  . fluticasone (FLONASE) 50 MCG/ACT nasal spray 2 spray  2 spray Each Nare Daily Harriet Butte, NP   2 spray at 03/28/15 0800  . hydrOXYzine (ATARAX/VISTARIL) tablet 25 mg  25 mg Oral TID PRN Harriet Butte, NP   25 mg at 03/27/15 2300  . lamoTRIgine (LAMICTAL) tablet 25 mg  25 mg Oral Daily Nicholaus Bloom, MD   25 mg at 03/28/15 1255  . loratadine (CLARITIN) tablet 10 mg  10 mg Oral Daily Harriet Butte, NP   10 mg at 03/28/15 0800  .  lurasidone (LATUDA) tablet 20 mg  20 mg Oral Q breakfast Nicholaus Bloom, MD   20 mg at 03/28/15 1256  . magnesium hydroxide (MILK OF MAGNESIA) suspension 30 mL  30 mL Oral Daily PRN Harriet Butte, NP      . nicotine (NICODERM CQ - dosed in mg/24 hours) patch 21 mg  21 mg Transdermal Daily Nicholaus Bloom, MD  21 mg at 03/28/15 0800  . traZODone (DESYREL) tablet 50 mg  50 mg Oral QHS PRN Harriet Butte, NP   50 mg at 03/27/15 2137    Lab Results: No results found for this or any previous visit (from the past 48 hour(s)).  Physical Findings: AIMS: Facial and Oral Movements Muscles of Facial Expression: None, normal Lips and Perioral Area: None, normal Jaw: None, normal Tongue: None, normal,Extremity Movements Upper (arms, wrists, hands, fingers): None, normal Lower (legs, knees, ankles, toes): None, normal, Trunk Movements Neck, shoulders, hips: None, normal, Overall Severity Severity of abnormal movements (highest score from questions above): None, normal Incapacitation due to abnormal movements: None, normal Patient's awareness of abnormal movements (rate only patient's report): No Awareness, Dental Status Current problems with teeth and/or dentures?: No Does patient usually wear dentures?: No  CIWA:  CIWA-Ar Total: 8 COWS:  COWS Total Score: 1  Treatment Plan Summary: Daily contact with patient to assess and evaluate symptoms and progress in treatment and Medication management Supportive approach/coping skills Alcohol dependence; complete the Librium detox protocol/work a relapse prevention plan Cocaine abuse; work a relapse prevention plan Mood instability; will resume Latuda 20 mg daily. States he had a better response to [place] when he tried it in the past. Had taken Abilify but can only remember he could not afford it once he ran out of samples Depression; will resume Lamictal 25 mg daily. He had some adverse side effects to Pristiq in the past so will abstain from using  antidepressants thinking that they could produce more mood intability   Medical Decision Making:  Review of Psycho-Social Stressors (1), Review or order clinical lab tests (1), Review of Medication Regimen & Side Effects (2) and Review of New Medication or Change in Dosage (2)     Cynitha Berte A 03/28/2015, 5:54 PM

## 2015-03-28 NOTE — Clinical Social Work Note (Signed)
CSW left voicemail for girlfriend Leonie Douglas [phone]. Awaiting return call.  Tilden Fossa, MSW, [place] Worker [facility] [phone]

## 2015-03-28 NOTE — Clinical Social Work Note (Signed)
At patient's request, CSW spoke with employer [employer] [phone] regarding patient's hospitalization. Manager reports that he is in the process of initiating leave of absence for patient.   Tilden Fossa, MSW, [place] Worker [facility] [phone]

## 2015-03-28 NOTE — Progress Notes (Signed)
Pt attended [place] speaker meeting. Pt appeared to be attentive and engaged.

## 2015-03-28 NOTE — BHH Group Notes (Signed)
   [facility] LCSW Aftercare Discharge Planning Group Note  03/28/2015  8:45 AM   Participation Quality: Alert, Appropriate and Oriented  Mood/Affect: Anxious  Depression Rating: 6  Anxiety Rating: Patient did not rate but reports that anxiety is high today regarding work stressors  Thoughts of Suicide: Pt denies SI/HI  Will you contract for safety? Yes  Current AVH: Pt denies  Plan for Discharge/Comments: Pt attended discharge planning group and actively participated in group. CSW provided pt with today's workbook. Patient reports feeling "better" today. He plans to return home to follow up with [facility]. He is requesting that CSW contact his employer.   Transportation Means: Pt reports access to transportation  Supports: No supports mentioned at this time  Tilden Fossa, MSW, [place] Social Worker [employer] [phone]

## 2015-03-28 NOTE — Progress Notes (Signed)
Pt attended spiritual care group on grief and loss facilitated by chaplain Jerene Pitch. Group opened with brief discussion and psycho-social ed around grief and loss in relationships and in relation to self - identifying life patterns, circumstances, changes that cause losses. Established group norm of speaking from own life experience. Group goal of establishing open and affirming space for members to share loss and experience with grief, normalize grief experience and provide psycho social education and grief support.  Group drew on narrative and Alderian therapeutic modalities.    Jonathan Lin was present and attentive throughout group.  Described that he first wanted to leave after discovering topic of grief, but decided to stay.  Described feeling that a lot of chronic stress in his life may be attributed to grief. Jonathan Lin particularly identified with grief around loss of self, and described isolating and using substances to cope with chronic loss.  Found the idea of attending grief group or counseling helpful.    [place], [place]

## 2015-03-29 DIAGNOSIS — F10230 Alcohol dependence with withdrawal, uncomplicated: Secondary | ICD-10-CM | POA: Insufficient documentation

## 2015-03-29 DIAGNOSIS — F332 Major depressive disorder, recurrent severe without psychotic features: Secondary | ICD-10-CM | POA: Insufficient documentation

## 2015-03-29 MED ORDER — HYDROXYZINE HCL 25 MG PO TABS: 25.0000 mg | ORAL_TABLET | Freq: Three times a day (TID) | ORAL | Status: DC | PRN

## 2015-03-29 MED ORDER — TRAZODONE HCL 50 MG PO TABS: 50.0000 mg | ORAL_TABLET | Freq: Every evening | ORAL | Status: DC | PRN

## 2015-03-29 MED ORDER — FLUTICASONE PROPIONATE 50 MCG/ACT NA SUSP: 2.0000 | Freq: Every day | NASAL | Status: DC

## 2015-03-29 MED ORDER — LURASIDONE HCL 20 MG PO TABS: 20.0000 mg | ORAL_TABLET | Freq: Every day | ORAL | Status: DC

## 2015-03-29 MED ORDER — LAMOTRIGINE 25 MG PO TABS: 25.0000 mg | ORAL_TABLET | Freq: Every day | ORAL | Status: DC

## 2015-03-29 MED ORDER — NICOTINE 21 MG/24HR TD PT24: 21.0000 mg | MEDICATED_PATCH | Freq: Every day | TRANSDERMAL | Status: DC

## 2015-03-29 MED ORDER — LORATADINE 10 MG PO TABS: 10.0000 mg | ORAL_TABLET | Freq: Every day | ORAL | Status: DC

## 2015-03-29 NOTE — Progress Notes (Signed)
  [facility] Adult Case Management Discharge Plan :  Will you be returning to the same living situation after discharge:  Yes,  Patient plans to return home At discharge, do you have transportation home?: Yes,  patient reports access to transportation Do you have the ability to pay for your medications: Yes,  patient will be provided with medication samples and prescriptions at discharge  Release of information consent forms completed and in the chart;  Patient's signature needed at discharge.  Patient to Follow up at: Follow-up Information    [facility] with [facility]   Why:  Walk-in Tuesdays between 8 am to 12 pm for assessment for therapy and medication management services. Ask about IOP program.   Contact information:   [address], [place]. [place], [place] [address] Office: [phone]  Fax: [phone]      Patient denies SI/HI: Yes,  denies    Safety Planning and Suicide Prevention discussed: Yes,  with patient, girlfriend, and mother  Have you used any form of tobacco in the last 30 days? (Cigarettes, Smokeless Tobacco, Cigars, and/or Pipes): Yes  Has patient been referred to the Quitline?: Patient refused referral  Taquisha Phung, Casimiro Needle 03/29/2015, 10:49 AM

## 2015-03-29 NOTE — BHH Group Notes (Signed)
[place] LCSW Group Therapy 03/29/2015 1:15 PM Type of Therapy: Group Therapy Participation Level: Active  Participation Quality: Attentive, Sharing and Supportive  Affect: Appropriate  Cognitive: Alert and Oriented  Insight: Developing/Improving and Engaged  Engagement in Therapy: Developing/Improving and Engaged  Modes of Intervention: Activity, Clarification, Confrontation, Discussion, Education, Exploration, Limit-setting, Orientation, Problem-solving, Rapport Building, Art therapist, Socialization and Support  Summary of Progress/Problems: Patient was attentive and engaged with speaker from [place]. Patient was attentive to speaker while they shared their story of dealing with mental health and overcoming it. Patient expressed interest in their programs and services and received information on their agency. Patient processed ways they can relate to the speaker.   Tilden Fossa, MSW, [place] Worker [facility] [phone]

## 2015-03-29 NOTE — BHH Group Notes (Signed)
Adult Psychoeducational Group Note  Date:  03/29/2015 Time:  0900 am   Group Topic/Focus:  Goals Group:   The focus of this group is to help patients establish daily goals to achieve during treatment and discuss how the patient can incorporate goal setting into their daily lives to aide in recovery. Orientation:   The focus of this group is to educate the patient on the purpose and policies of crisis stabilization and provide a format to answer questions about their admission.  The group details unit policies and expectations of patients while admitted.  Participation Level:  Active  Participation Quality:  Attentive, Sharing and Supportive  Affect:  Appropriate  Cognitive:  Alert and Appropriate  Insight: Good  Engagement in Group:  Engaged and Monopolizing  Modes of Intervention:  Discussion, Education, Limit-setting, Orientation and Support  Additional Comments:  Pt was attentive and participated during group, goal for today is to work on his discharge plan and speak with the social worker about his discharge plan, pt also stated that another goal he has is to stay positive, pt shared and was supportive towards others but did monopolize the group at times with his own sharing.  Wynn Banker 03/29/2015, 9:34 AM

## 2015-03-29 NOTE — [facility] Suicide Risk Assessment (Signed)
[facility] Discharge Suicide Risk Assessment   Demographic Factors:  Male and Caucasian  Total Time spent with patient: 30 minutes  Musculoskeletal: Strength & Muscle Tone: within normal limits Gait & Station: normal Patient leans: N/A  Psychiatric Specialty Exam: Physical Exam  Review of Systems  Constitutional: Negative.   HENT: Negative.   Eyes: Negative.   Respiratory: Negative.   Cardiovascular: Negative.   Gastrointestinal: Negative.   Genitourinary: Negative.   Musculoskeletal: Negative.   Skin: Negative.   Neurological: Negative.   Endo/Heme/Allergies: Negative.   Psychiatric/Behavioral: Positive for depression and substance abuse.    Blood pressure 122/80, pulse 85, temperature 98.4 F (36.9 C), temperature source Oral, resp. rate 20, height 5\' 11"  (1.803 m), weight 74.39 kg (164 lb), SpO2 94 %.Body mass index is 22.88 kg/(m^2).  General Appearance: Fairly Groomed  Engineer, water::  Fair  Speech:  Clear and [id number]  Volume:  Normal  Mood:  Anxious and worried  Affect:  Appropriate  Thought Process:  Coherent and Goal Directed  Orientation:  Full (Time, Place, and Person)  Thought Content:  plans as he moves on, relapse prevention plan  Suicidal Thoughts:  No  Homicidal Thoughts:  No  Memory:  Immediate;   Fair Recent;   Fair Remote;   Fair  Judgement:  Fair  Insight:  Present  Psychomotor Activity:  Normal  Concentration:  Fair  Recall:  [employer] [place]  Language: Fair  Akathisia:  No  Handed:  Right  AIMS (if indicated):     Assets:  Desire for Improvement Social Support Talents/Skills Vocational/Educational  Sleep:  Number of Hours: 1.5  Cognition: WNL  ADL's:  Intact   Have you used any form of tobacco in the last 30 days? (Cigarettes, Smokeless Tobacco, Cigars, and/or Pipes): Yes  Has this patient used any form of tobacco in the last 30 days? (Cigarettes, Smokeless Tobacco, Cigars, and/or Pipes) Yes, A prescription for an  FDA-approved tobacco cessation medication was offered at discharge and the patient refused  Mental Status Per Nursing Assessment::   On Admission:  Suicidal ideation indicated by patient  Current Mental Status by Physician: In full contact with reality. There are no active S/S of withdrawal. He states he is ready to do what he needs to do to abstain. Found out that his job is safe but also that his GF was braking up with him. States he understand her reaction and why she is so upset with him. He states he will continue to work on himself. He states that whatever happens with his GF will not affect the relationship he has with his son.    Loss Factors: Loss of significant relationship  Historical Factors: NA  Risk Reduction Factors:   Sense of responsibility to family, Employed and Positive social support  Continued Clinical Symptoms:  Depression:   Comorbid alcohol abuse/dependence Alcohol/Substance Abuse/Dependencies  Cognitive Features That Contribute To Risk:  None    Suicide Risk:  Minimal: No identifiable suicidal ideation.  Patients presenting with no risk factors but with morbid ruminations; may be classified as minimal risk based on the severity of the depressive symptoms  Principal Problem: Major depressive disorder, recurrent episode, severe Discharge Diagnoses:  Patient Active Problem List   Diagnosis Date Noted  . Major depressive disorder, recurrent episode, severe [F33.2] 03/26/2015  . Alcohol dependence [F10.20] 10/30/2013  . ADHD (attention deficit hyperactivity disorder), combined type [F90.2] 10/30/2013  . MDD (major depressive disorder) [F32.2] 10/29/2013  . Alcoholic [F09.32] 35/57/3220  .  TINEA PEDIS [B35.3] 01/18/2010  . 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 GERD [K21.9] 12/21/2009  . LOW BACK PAIN, CHRONIC [M54.5] 12/21/2009  . DRUG ABUSE, HX OF [Z91.89] 12/21/2009    Follow-up Information    [facility] with [facility] ([facility]   Why:   Walk-in Tuesdays between 8 am to 12 pm for assessment for therapy and medication management services. Ask about IOP program.   Contact information:   [address] [place]. [place], [place] [address] Office: [phone]  Fax: [phone]      Plan Of Care/Follow-up recommendations:  Activity:  as tolerated Diet:  regular Follow up [facility] as above Is patient on multiple antipsychotic therapies at discharge:  No   Has Patient had three or more failed trials of antipsychotic monotherapy by history:  No  Recommended Plan for Multiple Antipsychotic Therapies: NA    Jessie Cowher A 03/29/2015, 12:31 PM

## 2015-03-29 NOTE — Clinical Social Work Note (Signed)
Patient provided with listing of local recovery meetings and half-way houses.  Tilden Fossa, MSW, [place] Worker [facility] [phone]

## 2015-03-29 NOTE — Tx Team (Signed)
Interdisciplinary Treatment Plan Update (Adult) Date: 03/29/2015   Time Reviewed: 9:30 AM  Progress in Treatment: Attending groups: Yes Participating in groups: Yes Taking medication as prescribed: Yes Tolerating medication: Yes Family/Significant other contact made: Yes, CSW has spoken with girlfriend and mother Patient understands diagnosis: Yes Discussing patient identified problems/goals with staff: Yes Medical problems stabilized or resolved: Yes Denies suicidal/homicidal ideation: Yes Issues/concerns per patient self-inventory: Yes Other:  New problem(s) identified: N/A  Discharge Plan or Barriers:   5/17: Patient plans to return home to follow up with outpatient services, has declined referral for residential treatment at this time. Patient has been provided with [facility] listing, AA, and [facility] information.  Reason for Continuation of Hospitalization:  Depression Anxiety Medication Stabilization   Comments: N/A  Estimated length of stay: Discharge anticipated for today 5/17  For review of initial/current patient goals, please see plan of care.  Patient is a 39 year old Caucasian Male admitted for depression and substance abuse. Patient lives in [place] alone and identifies work as a stressor. Patient will benefit from crisis stabilization, medication evaluation, group therapy, and psycho education in addition to case management for discharge planning. Patient and CSW reviewed pt's identified goals and treatment plan. Pt verbalized understanding and agreed to treatment plan.   Attendees: Patient:    Family:    Physician: Dr. Sabra Heck; Dr. Parke Poisson 03/29/2015 9:30 AM  Nursing: Markham Jordan, Kerby Nora, Grayland Ormond, RN 03/29/2015 9:30 AM  Clinical Social Worker: Tilden Fossa,  [place] 03/29/2015 9:30 AM  Other: Joette Catching, LCSW 03/29/2015 9:30 AM  Other: Maxie Better, LCSWA  03/29/2015 9:30 AM  Other: Lars Pinks, Case Manager 03/29/2015 9:30 AM   Other:  Ave Filter, NP 03/29/2015 9:30 AM  Other: Lucinda Dell, [place]  03/29/2015 9:30 AM  Other:    Other:    Other:    Other:      Scribe for Treatment Team:  Tilden Fossa, MSW, [employer] [phone]

## 2015-03-29 NOTE — Discharge Summary (Signed)
Physician Discharge Summary Note  Patient:  Jonathan Lin is an 39 y.o., male MRN:  [id number] DOB:  09/27/1976 Patient phone:  [phone] (home)  Patient address:   [place] [address],  Total Time spent with patient: 30 minutes  Date of Admission:  03/26/2015 Date of Discharge: 03/29/15  Reason for Admission:  Mood stabilization treatments  Principal Problem: Major depressive disorder, recurrent episode, severe Discharge Diagnoses: Patient Active Problem List   Diagnosis Date Noted  . Major depressive disorder, recurrent, severe without psychotic features [F33.2]   . Alcohol dependence with uncomplicated withdrawal [I96.789]   . Major depressive disorder, recurrent episode, severe [F33.2] 03/26/2015  . Alcohol dependence [F10.20] 10/30/2013  . ADHD (attention deficit hyperactivity disorder), combined type [F90.2] 10/30/2013  . MDD (major depressive disorder) [F32.2] 10/29/2013  . Alcoholic [F81.01] 75/08/2584  . TINEA PEDIS [B35.3] 01/18/2010  . 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 GERD [K21.9] 12/21/2009  . LOW BACK PAIN, CHRONIC [M54.5] 12/21/2009  . DRUG ABUSE, HX OF [Z91.89] 12/21/2009    Musculoskeletal: Strength & Muscle Tone: within normal limits Gait & Station: normal Patient leans: N/A  Psychiatric Specialty Exam: Physical Exam  Psychiatric: He has a normal mood and affect. His speech is normal and behavior is normal. Judgment and thought content normal. Cognition and memory are normal.    Review of Systems  Constitutional: Negative.   HENT: Negative.   Eyes: Negative.   Respiratory: Negative.   Cardiovascular: Negative.   Gastrointestinal: Negative.   Genitourinary: Negative.   Musculoskeletal: Negative.   Skin: Negative.   Neurological: Negative.   Endo/Heme/Allergies: Negative.   Psychiatric/Behavioral: Positive for depression (Stabilized with treatments ) and hallucinations (Positive for cocaine on admission ). Negative for suicidal  ideas, memory loss and substance abuse. The patient is not nervous/anxious and does not have insomnia.     Blood pressure 122/80, pulse 85, temperature 98.4 F (36.9 C), temperature source Oral, resp. rate 20, height 5\' 11"  (1.803 m), weight 74.39 kg (164 lb), SpO2 94 %.Body mass index is 22.88 kg/(m^2).  See Physician SRA     Have you used any form of tobacco in the last 30 days? (Cigarettes, Smokeless Tobacco, Cigars, and/or Pipes): Yes  Has this patient used any form of tobacco in the last 30 days? (Cigarettes, Smokeless Tobacco, Cigars, and/or Pipes) Yes, A prescription for an FDA-approved tobacco cessation medication was offered at discharge and the patient refused  Past Medical History:  Past Medical History  Diagnosis Date  . Back pain   . Depression   . Alcoholism   . 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  . Bipolar 1 disorder     Past Surgical History  Procedure Laterality Date  . Extraction of wisdom teeth    . Wisdom tooth extraction  1994   Family History: History reviewed. No pertinent family history. Social History:  History  Alcohol Use  . Yes    Comment: once a week 4 beers - pt reports drinking a 18 pk 2 days ago, JGarrison rn     History  Drug Use  . Yes  . Special: Cocaine    Comment: Heroin pt reports last use was 3 wks ago    History   Social History  . Marital Status: Legally Separated    Spouse Name: N/A  . Number of Children: N/A  . Years of Education: N/A   Social History Main Topics  . Smoking status: Current Every  Day Smoker -- 0.50 packs/day    Types: Cigarettes  . Smokeless tobacco: Not on file  . Alcohol Use: Yes     Comment: once a week 4 beers - pt reports drinking a 18 pk 2 days ago, JGarrison rn  . Drug Use: Yes    Special: Cocaine     Comment: Heroin pt reports last use was 3 wks ago  . Sexual Activity: Yes    Birth Control/ Protection: Condom   Other Topics Concern  . None   Social  History Narrative   Risk to Self: Is patient at risk for suicide?: Yes What has been your use of drugs/alcohol within the last 12 months?: 12 pack of beer (or more) plus a pint or fifth of whiskey every other day; cocaine twice montly using 200-300$ and Heroin occassionally snorts 2+ lines (last used 2 lines 1 month ago) Risk to Others:   Prior Inpatient Therapy:   Prior Outpatient Therapy:    Level of Care:  OP  Hospital Course:   Jonathan Lin is a 39 year old male who was brought to the [facility] by a friend. The patient reported having suicidal thoughts with plan to hang himself. He reported that his drug abuse contributes to his depression. The patient reported using alcohol, cocaine, and heroin. Patient uses cocaine at least twice a month with alcohol being used more regularly. Patient states the following during his psychiatric assessment "I abuse drugs to help my depression. I could not afford my medication the last time. They wanted $700 for one of the medications and that is way too much. My mood gets very dark. I have to put a face on to function at work. I was diagnosed with Bipolar at [facility]. I have used Adderall in the past to boost my mood." The patient was cooperative with the assessment. He admits to alcohol abuse and was last an inpatient at [facility] 2/15. The patient appears very depressed throughout the assessment. He does not endorse a clear history of mania reporting that such symptoms occur after he starts using substances. Patient does report a long history of depression that has been present since his teenage years. He does report short periods when his mood can be very irritable. Denies any history of psychotic symptoms. 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 Patient is hoping that his job will be secure after discharge from the hospital. Jonathan Lin is able to contract for his safety on the unit. He does report a past  episode of hanging many years ago. His vital signs appear stable upon review.          Jonathan Lin was admitted to the adult unit. He was evaluated and his symptoms were identified. Medication management was discussed and initiated. The patient had not taken any psychiatric medications for some times prior to admission. Patient was started on Lamictal 25 mg daily for depression along with Latuda 20 mg daily for improved mood stability. He was ordered librium 25 mg every six hours as needed for possible symptoms of alcohol withdrawal.  He was oriented to the unit and encouraged to participate in unit programming. Medical problems were identified and treated appropriately. Home medication was restarted as needed.        The patient was evaluated each day by a clinical provider to ascertain the patient's response to treatment. He continued to struggle with depressive symptoms, which prompted medication changes to  be made.  Improvement was noted by the patient's report of decreasing symptoms, improved sleep and appetite, affect, medication tolerance, behavior, and participation in unit programming.  He was each day to complete a self inventory noting mood, mental status, pain, new symptoms, anxiety and concerns. Patient talked openly about his alcohol abuse. He showed insight by expressing that alcohol use leads the way for him to use cocaine. Patient also felt that working third shift made it harder for him to keep a routine schedule. Patient appeared very motivated to stay healthy after discharge and was motivated to pursue therapy.          He responded well to medication and being in a therapeutic and supportive environment. Positive and appropriate behavior was noted and the patient was motivated for recovery.  The patient worked closely with the treatment team and case manager to develop a discharge plan with appropriate goals. Coping skills, problem solving as well as relaxation therapies were also part of  the unit programming.         By the day of discharge he was in much improved condition than upon admission.  Symptoms were reported as significantly decreased or resolved completely. The patient denied SI/HI and voiced no AVH. He was motivated to continue taking medication with a goal of continued improvement in mental health.    Jonathan Lin was discharged home with a plan to follow up as noted below. The patient was provided with two week sample medications and prescriptions at time of discharge. He left [facility] in stable condition with all belongings returned to him.   Consults:  psychiatry  Significant Diagnostic Studies:  Chemistry panel, CBC, UDS positive for cocaine,   Discharge Vitals:   Blood pressure 122/80, pulse 85, temperature 98.4 F (36.9 C), temperature source Oral, resp. rate 20, height 5\' 11"  (1.803 m), weight 74.39 kg (164 lb), SpO2 94 %. Body mass index is 22.88 kg/(m^2). Lab Results:   No results found for this or any previous visit (from the past 72 hour(s)).  Physical Findings: AIMS: Facial and Oral Movements Muscles of Facial Expression: None, normal Lips and Perioral Area: None, normal Jaw: None, normal Tongue: None, normal,Extremity Movements Upper (arms, wrists, hands, fingers): None, normal Lower (legs, knees, ankles, toes): None, normal, Trunk Movements Neck, shoulders, hips: None, normal, Overall Severity Severity of abnormal movements (highest score from questions above): None, normal Incapacitation due to abnormal movements: None, normal Patient's awareness of abnormal movements (rate only patient's report): No Awareness, Dental Status Current problems with teeth and/or dentures?: No Does patient usually wear dentures?: No  CIWA:  CIWA-Ar Total: 8 COWS:  COWS Total Score: 1   See Psychiatric Specialty Exam and Suicide Risk Assessment completed by Attending Physician prior to discharge.  Discharge destination:  Home  Is patient on multiple antipsychotic  therapies at discharge:  No   Has Patient had three or more failed trials of antipsychotic monotherapy by history:  No  Recommended Plan for Multiple Antipsychotic Therapies: NA     Medication List    STOP taking these medications        Cetirizine HCl 10 MG Caps  Commonly known as:  ZYRTEC ALLERGY  Replaced by:  loratadine 10 MG tablet     chlorpheniramine-HYDROcodone 10-8 MG/5ML Lqcr  Commonly known as:  TUSSIONEX PENNKINETIC ER     naproxen 375 MG tablet  Commonly known as:  NAPROSYN      TAKE these medications      Indication  fluticasone 50 MCG/ACT nasal spray  Commonly known as:  FLONASE  Place 2 sprays into both nostrils daily.   Indication:  Hayfever     hydrOXYzine 25 MG tablet  Commonly known as:  ATARAX/VISTARIL  Take 1 tablet (25 mg total) by mouth 3 (three) times daily as needed for anxiety.   Indication:  Anxiety associated with Organic Disease, Tension     lamoTRIgine 25 MG tablet  Commonly known as:  LAMICTAL  Take 1 tablet (25 mg total) by mouth daily.   Indication:  Depression     loratadine 10 MG tablet  Commonly known as:  CLARITIN  Take 1 tablet (10 mg total) by mouth daily.   Indication:  Hayfever     Lurasidone HCl 20 MG Tabs  Take 1 tablet (20 mg total) by mouth daily with breakfast.   Indication:  Mood stabilization     nicotine 21 mg/24hr patch  Commonly known as:  NICODERM CQ - dosed in mg/24 hours  Place 1 patch (21 mg total) onto the skin daily.   Indication:  Nicotine Addiction     traZODone 50 MG tablet  Commonly known as:  DESYREL  Take 1 tablet (50 mg total) by mouth at bedtime as needed for sleep.   Indication:  Trouble Sleeping       Follow-up Information    Follow [facility] with [facility] ([facility]   Why:  Walk-in Tuesdays between 8 am to 12 pm for assessment for therapy and medication management services. Ask about IOP program.   Contact information:   [address] [place]. [place],   [address] Office: [phone]  Fax: [phone]      Follow-up recommendations:   Activity: as tolerated Diet: regular Follow up [facility] as above  Comments:   Take all your medications as prescribed by your mental healthcare provider.  Report any adverse effects and or reactions from your medicines to your outpatient provider promptly.  Patient is instructed and cautioned to not engage in alcohol and or illegal drug use while on prescription medicines.  In the event of worsening symptoms, patient is instructed to call the crisis hotline, 911 and or go to the nearest ED for appropriate evaluation and treatment of symptoms.  Follow-up with your primary care provider for your other medical issues, concerns and or health care needs.   Total Discharge Time: Greater than 30 minutes  Signed: DAVIS, LAURA NP-C 03/29/2015, 4:02 PM  I personally assessed the patient and formulated the plan Geralyn Flash A. Sabra Heck, M.D.

## 2015-03-29 NOTE — Progress Notes (Signed)
D/C instructions/meds/follow-up appointments reviewed, pt verbalized understanding, pt's belongings returned to pt, samples given, denies SI/HI/AVH

## 2015-03-29 NOTE — Plan of Care (Signed)
Problem: Alteration in mood; excessive anxiety as evidenced by: Goal: STG-Patient can identify triggers for anxiety Outcome: Progressing Client able to identify triggers for anxiety as evidenced by discussing feelings with Probation officer about wife and mom cutting him off, "I'm more disappointed in myself, for starting back using" "being around the wrong people"

## 2015-12-17 ENCOUNTER — Emergency Department (HOSPITAL_BASED_OUTPATIENT_CLINIC_OR_DEPARTMENT_OTHER): Payer: Worker's Compensation

## 2015-12-17 ENCOUNTER — Encounter (HOSPITAL_BASED_OUTPATIENT_CLINIC_OR_DEPARTMENT_OTHER): Payer: Self-pay | Admitting: Emergency Medicine

## 2015-12-17 ENCOUNTER — Emergency Department (HOSPITAL_BASED_OUTPATIENT_CLINIC_OR_DEPARTMENT_OTHER)
Admission: EM | Admit: 2015-12-17 | Discharge: 2015-12-17 | Disposition: A | Discharge status: Home / Self Care | Payer: Worker's Compensation | Attending: Physician Assistant | Admitting: Physician Assistant

## 2015-12-17 DIAGNOSIS — F17210 Nicotine dependence, cigarettes, uncomplicated: Secondary | ICD-10-CM | POA: Insufficient documentation

## 2015-12-17 DIAGNOSIS — Y990 Civilian activity done for income or pay: Secondary | ICD-10-CM | POA: Insufficient documentation

## 2015-12-17 DIAGNOSIS — F319 Bipolar disorder, unspecified: Secondary | ICD-10-CM | POA: Insufficient documentation

## 2015-12-17 DIAGNOSIS — Z7951 Long term (current) use of inhaled steroids: Secondary | ICD-10-CM | POA: Insufficient documentation

## 2015-12-17 DIAGNOSIS — S29001A Unspecified injury of muscle and tendon of front wall of thorax, initial encounter: Secondary | ICD-10-CM | POA: Insufficient documentation

## 2015-12-17 DIAGNOSIS — R079 Chest pain, unspecified: Secondary | ICD-10-CM

## 2015-12-17 DIAGNOSIS — R05 Cough: Secondary | ICD-10-CM | POA: Insufficient documentation

## 2015-12-17 DIAGNOSIS — Y9289 Other specified places as the place of occurrence of the external cause: Secondary | ICD-10-CM | POA: Insufficient documentation

## 2015-12-17 DIAGNOSIS — Y9389 Activity, other specified: Secondary | ICD-10-CM | POA: Insufficient documentation

## 2015-12-17 DIAGNOSIS — Z86018 Personal history of other benign neoplasm: Secondary | ICD-10-CM | POA: Insufficient documentation

## 2015-12-17 DIAGNOSIS — X500XXA Overexertion from strenuous movement or load, initial encounter: Secondary | ICD-10-CM | POA: Insufficient documentation

## 2015-12-17 DIAGNOSIS — Z79899 Other long term (current) drug therapy: Secondary | ICD-10-CM | POA: Insufficient documentation

## 2015-12-17 LAB — CBC WITH DIFFERENTIAL/PLATELET
Basophils Absolute: 0 10*3/uL (ref 0.0–0.1)
Basophils Relative: 0 %
Eosinophils Absolute: 0.2 10*3/uL (ref 0.0–0.7)
Eosinophils Relative: 3 %
HCT: 43.9 % (ref 39.0–52.0)
Hemoglobin: 14.3 g/dL (ref 13.0–17.0)
Lymphocytes Relative: 24 %
Lymphs Abs: 1.6 10*3/uL (ref 0.7–4.0)
MCH: 29.4 pg (ref 26.0–34.0)
MCHC: 32.6 g/dL (ref 30.0–36.0)
MCV: 90.3 fL (ref 78.0–100.0)
Monocytes Absolute: 0.5 10*3/uL (ref 0.1–1.0)
Monocytes Relative: 8 %
Neutro Abs: 4.1 10*3/uL (ref 1.7–7.7)
Neutrophils Relative %: 65 %
Platelets: 193 10*3/uL (ref 150–400)
RBC: 4.86 MIL/uL (ref 4.22–5.81)
RDW: 13.6 % (ref 11.5–15.5)
WBC: 6.4 10*3/uL (ref 4.0–10.5)

## 2015-12-17 LAB — COMPREHENSIVE METABOLIC PANEL
ALT: 19 U/L (ref 17–63)
AST: 19 U/L (ref 15–41)
Albumin: 4.2 g/dL (ref 3.5–5.0)
Alkaline Phosphatase: 51 U/L (ref 38–126)
Anion gap: 8 (ref 5–15)
BUN: 12 mg/dL (ref 6–20)
CO2: 27 mmol/L (ref 22–32)
Calcium: 9.1 mg/dL (ref 8.9–10.3)
Chloride: 104 mmol/L (ref 101–111)
Creatinine, Ser: 0.82 mg/dL (ref 0.61–1.24)
GFR calc Af Amer: >60 mL/min (ref >60)
GFR calc non Af Amer: >60 mL/min (ref >60)
Glucose, Bld: 84 mg/dL (ref 65–99)
Potassium: 3.9 mmol/L (ref 3.5–5.1)
Sodium: 139 mmol/L (ref 135–145)
Total Bilirubin: 0.6 mg/dL (ref 0.3–1.2)
Total Protein: 7.5 g/dL (ref 6.5–8.1)

## 2015-12-17 LAB — TROPONIN I: Troponin I: <0.03 ng/mL (ref <0.031)

## 2015-12-17 MED ORDER — IBUPROFEN 800 MG PO TABS: 800.0000 mg | ORAL_TABLET | Freq: Three times a day (TID) | ORAL | Status: DC

## 2015-12-17 NOTE — ED Notes (Signed)
Pt in c/o chest pain onset this evening while lifting at work at [employer]. Describes pain as tight and sharp, intermittent, with some lightheadedness on standing from sitting position.

## 2015-12-17 NOTE — ED Notes (Signed)
DC'd 20G PIV from L AC -- catheter intact; site unremarkable(no redness, swelling, bruising)

## 2015-12-17 NOTE — ED Provider Notes (Signed)
CSN: [id number]     Arrival date & time 12/17/15  2008 History  By signing my name below, I, Evelene Croon, attest that this documentation has been prepared under the direction and in the presence of Keyshla Tunison Julio Alm, MD . Electronically Signed: Evelene Croon, Scribe. 12/17/2015. 8:36 PM.    Chief Complaint  Patient presents with  . Chest Pain    The history is provided by the patient. No language interpreter was used.     HPI Comments:  Demarr Kangas is a 40 y.o. male who presents to the Emergency Department complaining of sharp intermittent left sided CP which began today ~1.5 hours ago while doing heavy lifting at work in [employer]. He reports associated cough but notes he is a current everyday smoker. He denies h/o MI, DM, and HLD. No alleviating factors noted.  Past Medical History  Diagnosis Date  . Back pain   . Depression   . Alcoholism ([place])   . 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  . Bipolar 1 disorder [facility])    Past Surgical History  Procedure Laterality Date  . Extraction of wisdom teeth    . Wisdom tooth extraction  1994   History reviewed. No pertinent family history. Social History  Substance Use Topics  . Smoking status: Current Every Day Smoker -- 0.50 packs/day    Types: Cigarettes  . Smokeless tobacco: None  . Alcohol Use: Yes     Comment: once a week 4 beers - pt reports drinking a 18 pk 2 days ago, JGarrison rn    Review of Systems 10 systems reviewed and all are negative for acute change except as noted in the HPI.  Allergies  Review of patient's allergies indicates no known allergies.  Home Medications   Prior to Admission medications   Medication Sig Start Date End Date Taking? Authorizing Provider  fluticasone (FLONASE) 50 MCG/ACT nasal spray Place 2 sprays into both nostrils daily. 03/29/15   Niel Hummer, NP  hydrOXYzine (ATARAX/VISTARIL) 25 MG tablet Take 1 tablet (25 mg total) by mouth 3 (three)  times daily as needed for anxiety. 03/29/15   Niel Hummer, NP  lamoTRIgine (LAMICTAL) 25 MG tablet Take 1 tablet (25 mg total) by mouth daily. 03/29/15   Niel Hummer, NP  loratadine (CLARITIN) 10 MG tablet Take 1 tablet (10 mg total) by mouth daily. 03/29/15   Niel Hummer, NP  lurasidone 20 MG TABS Take 1 tablet (20 mg total) by mouth daily with breakfast. 03/29/15   Niel Hummer, NP  nicotine (NICODERM CQ - DOSED IN MG/24 HOURS) 21 mg/24hr patch Place 1 patch (21 mg total) onto the skin daily. 03/29/15   Niel Hummer, NP  traZODone (DESYREL) 50 MG tablet Take 1 tablet (50 mg total) by mouth at bedtime as needed for sleep. 03/29/15   Niel Hummer, NP   BP 126/82 mmHg  Pulse 96  Temp(Src) 98.3 F (36.8 C) (Oral)  Resp 18  Ht 5\' 11"  (1.803 m)  Wt 167 lb (75.751 kg)  BMI 23.30 kg/m2  SpO2 100% Physical Exam  Constitutional: He is oriented to person, place, and time. He appears well-developed and well-nourished. No distress.  HENT:  Head: Normocephalic and atraumatic.  Eyes: Conjunctivae are normal.  Cardiovascular: Normal rate, regular rhythm and normal heart sounds.   Pulmonary/Chest: Effort normal and breath sounds normal. No respiratory distress.  Abdominal: Soft. He exhibits no distension. There is  no tenderness.  Musculoskeletal: Normal range of motion. He exhibits no edema or tenderness.  Neurological: He is alert and oriented to person, place, and time.  Skin: Skin is warm and dry.  Psychiatric: He has a normal mood and affect.  Nursing note and vitals reviewed.   ED Course  Procedures  DIAGNOSTIC STUDIES:  Oxygen Saturation is 100% on RA, normal by my interpretation.    COORDINATION OF CARE:  8:25 PM Will order CXR and troponin.Discussed treatment plan with pt at bedside and pt agreed to plan.  Labs Review Labs Reviewed  CBC WITH DIFFERENTIAL/PLATELET  COMPREHENSIVE METABOLIC PANEL  TROPONIN I    Imaging Review Dg Chest 2 View  12/17/2015  CLINICAL DATA:  Mid  to left chest pain after lifting heavy object at work EXAM: CHEST  2 VIEW COMPARISON:  Multiple prior studies including 07/06/2015 FINDINGS: The heart size and vascular pattern are normal. There is no consolidation effusion or pneumothorax. A 2 cm round structure projects over the right upper lobe laterally with a similar projection over the left upper lobe. IMPRESSION: Rounded densities over both upper lobes could represent EKG leads. Correlate clinically and consider removing leads and repeating the radiograph if there is any ambiguity. Electronically Signed   By: Skipper Cliche M.D.   On: 12/17/2015 20:56   I have personally reviewed and evaluated these images and lab results as part of my medical decision-making.   EKG Interpretation   Date/Time:  Saturday December 17 2015 20:16:29 EST Ventricular Rate:  86 PR Interval:  136 QRS Duration: 98 QT Interval:  372 QTC Calculation: 445 R Axis:   80 Text Interpretation:  Normal sinus rhythm Normal ECG no acute ischemia  Confirmed by Gerald Leitz ([id number]) on 12/17/2015 8:20:04 PM      MDM   Final diagnoses:  None    Patient is a 40 year old male with history of suicidality, alcoholism, depression bipolar disorder presenting today with chest pain in the context of lifting at work. Patient had a cough when he woke up this morning to go to work. He reported some chest pain with coughing. 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 He will reports that the pain is made worse while working.  Patient has no history of hypertension hyperlipidemia or CAD. History of present illness makes it sound as if this is likely muscular skeletal.  We will get chest x-ray, EKG, single troponin and CBC Chem-7. If these are all within normal limits we'll plan to have patient follow up with PCP.   I personally performed the services described in this documentation, which was scribed in my presence. The recorded information has been reviewed and is accurate.    9:34  PM Heart score 1. Normal EKG, neg trop , normal CXR.  Will have him follow up with PCP.    Scout Guyett Julio Alm, MD 12/17/15 2134

## 2015-12-17 NOTE — Discharge Instructions (Signed)
We are unsure what is causing your pain today. Your ekg and xray appear normal.  Please follow up with your regular phsyciain on Monday.

## 2015-12-17 NOTE — ED Notes (Signed)
Pt on heart monitor 

## 2015-12-17 NOTE — ED Notes (Signed)
EKG unremarkable.

## 2019-03-02 ENCOUNTER — Other Ambulatory Visit: Payer: Self-pay

## 2019-03-02 ENCOUNTER — Emergency Department (HOSPITAL_COMMUNITY)
Admission: EM | Admit: 2019-03-02 | Discharge: 2019-03-02 | Disposition: A | Discharge status: Home / Self Care | Payer: Self-pay | Attending: Emergency Medicine | Admitting: Emergency Medicine

## 2019-03-02 ENCOUNTER — Encounter (HOSPITAL_COMMUNITY): Payer: Self-pay

## 2019-03-02 ENCOUNTER — Emergency Department (HOSPITAL_COMMUNITY): Payer: Self-pay

## 2019-03-02 DIAGNOSIS — Z20822 Contact with and (suspected) exposure to covid-19: Secondary | ICD-10-CM

## 2019-03-02 DIAGNOSIS — R05 Cough: Secondary | ICD-10-CM | POA: Diagnosis present

## 2019-03-02 DIAGNOSIS — F17200 Nicotine dependence, unspecified, uncomplicated: Secondary | ICD-10-CM | POA: Diagnosis not present

## 2019-03-02 DIAGNOSIS — B9789 Other viral agents as the cause of diseases classified elsewhere: Secondary | ICD-10-CM | POA: Insufficient documentation

## 2019-03-02 DIAGNOSIS — J069 Acute upper respiratory infection, unspecified: Secondary | ICD-10-CM

## 2019-03-02 DIAGNOSIS — Z7982 Long term (current) use of aspirin: Secondary | ICD-10-CM | POA: Diagnosis not present

## 2019-03-02 DIAGNOSIS — Z79899 Other long term (current) drug therapy: Secondary | ICD-10-CM | POA: Diagnosis not present

## 2019-03-02 DIAGNOSIS — Z20828 Contact with and (suspected) exposure to other viral communicable diseases: Secondary | ICD-10-CM | POA: Diagnosis not present

## 2019-03-02 DIAGNOSIS — R6889 Other general symptoms and signs: Secondary | ICD-10-CM

## 2019-03-02 MED ORDER — BENZONATATE 100 MG PO CAPS: 100.0000 mg | ORAL_CAPSULE | Freq: Three times a day (TID) | ORAL | 0 refills | Status: DC | PRN

## 2019-03-02 MED ORDER — ALBUTEROL SULFATE HFA 108 (90 BASE) MCG/ACT IN AERS
2.0000 | INHALATION_SPRAY | Freq: Once | RESPIRATORY_TRACT | Status: AC
  Administered 2019-03-02: 2 via RESPIRATORY_TRACT
  Filled 2019-03-02: qty 6.7

## 2019-03-02 NOTE — ED Provider Notes (Signed)
[facility] DEPT Provider Note   CSN: [id number] Arrival date & time: 03/02/19  1093    History   Chief Complaint Chief Complaint  Patient presents with  . Cough  . Shortness of Breath    HPI Jonathan Lin is a 43 y.o. male.     The history is provided by the patient. No language interpreter was used.  Cough  Associated symptoms: shortness of breath   Shortness of Breath  Associated symptoms: cough    Jonathan Lin is a 43 y.o. male who presents to the Emergency Department complaining of cough/sob. He reports starting to feel poorly two days ago with cough, shortness of breath, diarrhea, sore throat and chills. He denies any fevers, chest pain, abdominal pain, leg swelling or pain. He works in [employer] and has been in contact with multiple people. He is concerned that he may have contracted coronavirus. He denies any hemoptysis. He has no medical problems and takes no medications. He does smoke cigarettes and drinks alcohol. He has responded to albuterol treatment in the past but he has no history of reactive airway disease. He states that his roommate heard him wheezing when he was sleeping one night. Past Medical History:  Diagnosis Date  . Alcoholism ([place])   . Back pain   . Bipolar 1 disorder ([place])   . Depression   . 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    Patient Active Problem List   Diagnosis Date Noted  . Major depressive disorder, recurrent, severe without psychotic features ([place])   . Alcohol dependence with uncomplicated withdrawal ([place])   . Major depressive disorder, recurrent episode, severe ([place]) 03/26/2015  . Alcohol dependence ([place]) 10/30/2013  . ADHD (attention deficit hyperactivity disorder), combined type 10/30/2013  . MDD (major depressive disorder) 10/29/2013  . Alcoholic ([place]) 23/55/7322  . TINEA PEDIS 01/18/2010  . 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 GERD 12/21/2009  . LOW BACK PAIN, CHRONIC  12/21/2009  . DRUG ABUSE, HX OF 12/21/2009    Past Surgical History:  Procedure Laterality Date  . extraction of wisdom teeth    . [place] Medications    Prior to Admission medications   Medication Sig Start Date End Date Taking? Authorizing Provider  aspirin EC 81 MG tablet Take 81 mg by mouth daily.   Yes [provider]  zinc gluconate 50 MG tablet Take 100 mg by mouth daily.   Yes [provider]  benzonatate (TESSALON) 100 MG capsule Take 1 capsule (100 mg total) by mouth 3 (three) times daily as needed for cough. 03/02/19   Quintella Reichert, MD  fluticasone [facility]) 50 MCG/ACT nasal spray Place 2 sprays into both nostrils daily. Patient not taking: Reported on 03/02/2019 03/29/15   Niel Hummer, NP  hydrOXYzine (ATARAX/VISTARIL) 25 MG tablet Take 1 tablet (25 mg total) by mouth 3 (three) times daily as needed for anxiety. Patient not taking: Reported on 03/02/2019 03/29/15   Niel Hummer, NP  ibuprofen (ADVIL,MOTRIN) 800 MG tablet Take 1 tablet (800 mg total) by mouth 3 (three) times daily. Patient not taking: Reported on 03/02/2019 12/17/15   Mackuen, Fredia Sorrow, MD  lamoTRIgine (LAMICTAL) 25 MG tablet Take 1 tablet (25 mg total) by mouth daily. Patient not taking: Reported on 03/02/2019 03/29/15   Niel Hummer, NP  loratadine (CLARITIN) 10 MG tablet Take 1 tablet (10 mg total) by mouth  daily. Patient not taking: Reported on 03/02/2019 03/29/15   Niel Hummer, NP  lurasidone 20 MG TABS Take 1 tablet (20 mg total) by mouth daily with breakfast. Patient not taking: Reported on 03/02/2019 03/29/15   Niel Hummer, NP  nicotine (NICODERM CQ - DOSED IN MG/24 HOURS) 21 mg/24hr patch Place 1 patch (21 mg total) onto the skin daily. Patient not taking: Reported on 03/02/2019 03/29/15   Niel Hummer, NP  traZODone (DESYREL) 50 MG tablet Take 1 tablet (50 mg total) by mouth at bedtime as needed for sleep. Patient not taking: Reported  on 03/02/2019 03/29/15   Niel Hummer, NP    Family History No family history on file.  Social History Social History   Tobacco Use  . Smoking status: Current Every Day Smoker    Packs/day: 0.50    Types: Cigarettes  . Smokeless tobacco: Never Used  Substance Use Topics  . Alcohol use: Yes    Comment: once a week 4 beers - pt reports drinking a 18 pk 2 days ago, JGarrison rn  . Drug use: Yes    Types: Cocaine    Comment: last used over a year ago     Allergies   Codeine   Review of Systems Review of Systems  Respiratory: Positive for cough and shortness of breath.   All other systems reviewed and are negative.    Physical Exam Updated Vital Signs BP 137/89   Pulse 72   Temp 98.3 F (36.8 C)   Resp 15   Ht 5\' 11"  (1.803 m)   Wt 81.6 kg   SpO2 98%   BMI 25.10 kg/m   Physical Exam Vitals signs and nursing note reviewed.  Constitutional:      Appearance: He is well-developed.  HENT:     Head: Normocephalic and atraumatic.     Mouth/Throat:     Mouth: Mucous membranes are moist.     Comments: Mild erythema in the posterior oropharynx without edema or exudates. Cardiovascular:     Rate and Rhythm: Normal rate and regular rhythm.     Heart sounds: No murmur.  Pulmonary:     Effort: Pulmonary effort is normal. No respiratory distress.     Breath sounds: Normal breath sounds.  Abdominal:     Palpations: Abdomen is soft.     Tenderness: There is no abdominal tenderness. There is no guarding or rebound.  Musculoskeletal:        General: No swelling or tenderness.  Skin:    General: Skin is warm and dry.     Capillary Refill: Capillary refill takes less than 2 seconds.  Neurological:     Mental Status: He is alert and oriented to person, place, and time.  Psychiatric:        Behavior: Behavior normal.      ED Treatments / Results  Labs (all labs ordered are listed, but only abnormal results are displayed) Labs Reviewed - No data to display  EKG  None  Radiology Dg Chest [facility] 1 View  Result Date: 03/02/2019 CLINICAL DATA:  Cough for 2 days EXAM: PORTABLE CHEST 1 VIEW COMPARISON:  December 17, 2015 FINDINGS: There is no edema or consolidation. Heart size and pulmonary vascularity are normal. No adenopathy. No pneumothorax. No bone lesions. IMPRESSION: No edema or consolidation. Electronically Signed   By: Lowella Grip III M.D.   On: 03/02/2019 08:13    Procedures Procedures (including critical care time)  Medications Ordered in ED Medications  albuterol (VENTOLIN HFA) 108 (90 Base) MCG/ACT inhaler 2 puff (2 puffs Inhalation Given 03/02/19 0808)     Initial Impression / Assessment and Plan / ED Course  I have reviewed the triage vital signs and the nursing notes.  Pertinent labs & imaging results that were available during my care of the patient were reviewed by me and considered in my medical decision making (see chart for details).        Patient here for evaluation of cough, chills, diarrhea, sore throat. He is non-toxic appearing on evaluation with no respiratory distress. Presentation is not consistent with CHF, PE, sepsis. Providing trial of albuterol for symptomatic treatment. Patient does not meet current COVID testing guidelines. Discussed with patient home care and isolation as well as outpatient follow-up and return precautions.  Jonathan Lin was evaluated in Emergency Department on 03/02/2019 for the symptoms described in the history of present illness. He was evaluated in the context of the global COVID-19 pandemic, which necessitated consideration that the patient might be at risk for infection with the SARS-CoV-2 virus that causes COVID-19. Institutional protocols and algorithms that pertain to the evaluation of patients at risk for COVID-19 are in a state of rapid change based on information released by regulatory bodies including the CDC and federal and state organizations. These policies and algorithms were  followed during the patient's care in the ED.   Final Clinical Impressions(s) / ED Diagnoses   Final diagnoses:  Viral URI with cough  Suspected Covid-19 Virus Infection    ED Discharge Orders         Ordered    benzonatate (TESSALON) 100 MG capsule  3 times daily PRN     03/02/19 0826           Quintella Reichert, MD 03/02/19 [phone]

## 2019-03-02 NOTE — ED Notes (Signed)
Discharge instructions reviewed with patient. Patient verbalizes understanding. VSS.   

## 2019-03-02 NOTE — ED Triage Notes (Signed)
Pt c/o SHOB, dry cough, chills, sore throat, nasal congestion and chest tightness from coughing x2 days. Pt denies N/V or fevers at home.

## 2019-03-03 DIAGNOSIS — F17210 Nicotine dependence, cigarettes, uncomplicated: Secondary | ICD-10-CM | POA: Insufficient documentation

## 2019-03-03 DIAGNOSIS — R059 Cough, unspecified: Secondary | ICD-10-CM | POA: Insufficient documentation

## 2019-11-03 ENCOUNTER — Telehealth: Payer: Self-pay | Admitting: *Deleted

## 2019-11-03 NOTE — Telephone Encounter (Signed)
RN was advised by HR manager Hyacinth Meeker that pt was told yesterday 12/21 while he was at work that his ex-spouse was at the hospital for treatment of Covid sx and has since received a positive covid test. They share a child that lives with his mother/pt's ex primarily. On Sunday 12/20, child was at pt's home for 4-5 hours. Girlfriend whom also works at [employer], but is working remotely currently lives in same household as pt. Pt and girlfriend both deny any current sx.   Due to exposure, RN advised pt to follow 14 day asymptomatic with exposure person quarantine per [facility] recommendations. Advised pt that Day 1 of quarantine is 11/02/19. Plan for testing on Day 8, date 11/09/19. Pt lives in [place]. [employer] testing site for pt. Appt mnade for drive up testing for pt on 11/09/19 at 0800. Reviewed drive up testing instructions and directions with pt, ie attend appt time, remain in vehicle, wear mask, expect results back in 2-3 days. Will f/u pt on 11/12/19 with anticipated available results. If no sx develop and no fever in previous >24hrs without antipyretics, pt to complete quarantine 11/15/19 and return to work 11/16/19.    Reviewed possible Covid sx including cough, ShOB, sinusitis sx, sore throat, fever/chills, body aches, fatigue, loss of taste/smell, GI sx n/v/d. Also reviewed same day/emergent eval/ER precautions of dizziness/syncope, confusion, blue tint to lips/face, severe ShOB/difficulty breathing.    Pt verbalizes understanding and agreement with plan of care. No further questions/concerns at this time. Pt reminded to contact clinic with any changes in sx or questions/concerns.

## 2019-11-03 NOTE — Telephone Encounter (Signed)
Noted agree with plan of care 

## 2019-11-09 ENCOUNTER — Ambulatory Visit: Payer: HRSA Program | Attending: Internal Medicine

## 2019-11-09 DIAGNOSIS — Z20828 Contact with and (suspected) exposure to other viral communicable diseases: Secondary | ICD-10-CM | POA: Diagnosis present

## 2019-11-09 DIAGNOSIS — Z20822 Contact with and (suspected) exposure to covid-19: Secondary | ICD-10-CM

## 2019-11-11 LAB — NOVEL CORONAVIRUS, NAA: SARS-CoV-2, NAA: NOT DETECTED

## 2019-11-12 ENCOUNTER — Telehealth: Payer: Self-pay | Admitting: Registered Nurse

## 2019-11-12 NOTE — Telephone Encounter (Signed)
noted 

## 2019-11-12 NOTE — Telephone Encounter (Signed)
Spoke with pt's significant other over phone. Pt previously verbally approved results and follow up could be completed with partner during 11/03/19 phone call. Partner given negative test result, instructed to continue to monitor for sx through end of quarantine on 11/15/19 and pt eligible to return to work on 11/16/19. Instructed her to have pt call clinic if he has any questions or concerns.

## 2019-11-12 NOTE — Telephone Encounter (Signed)
Please notify patient negative Covid PCR test result.  Exposure 11/01/2019 known positive. Continue quarantine at home and monitoring for symptoms of covid e.g. runny nose, sore throat, headache, loss of taste/smell, nausea/vomiting/diarrhea, fever/chills, body aches, cough, shortness of breath/dyspnea.   Patient to notify clinic staff if symptoms develop.  Quarantine to end  11/15/2019 and return to work 11/16/2019 as long as symptom free continues and no new known covid close positive contacts. Discuss with patient covid spread and incidence in community high.  Continue to protect self with mask wear, hand sanitizing washing, avoid touching face and maintaining social distancing 6 feet or greater.  [employer] HR Tim notified of negative test result.

## 2019-11-12 NOTE — Telephone Encounter (Signed)
LVM for pt requesting call back.

## 2019-11-17 NOTE — Telephone Encounter (Signed)
Please verify patient returned to work as expected 

## 2019-11-17 NOTE — Telephone Encounter (Signed)
Please verify patient returned to work as expected 11/16/2019

## 2019-11-17 NOTE — Telephone Encounter (Signed)
Per supervisor, pt did return to work as planned yesterday, 11/16/19.

## 2019-11-17 NOTE — Telephone Encounter (Signed)
noted 

## 2019-11-17 NOTE — Telephone Encounter (Signed)
Patient returned to work yesterday per Librarian, academic

## 2019-11-17 NOTE — Telephone Encounter (Signed)
Pt pt's supervisor, pt did return to work as planned yesterday, 11/16/19.

## 2020-02-04 ENCOUNTER — Ambulatory Visit: Payer: Self-pay | Attending: Internal Medicine

## 2020-02-04 DIAGNOSIS — Z23 Encounter for immunization: Secondary | ICD-10-CM

## 2020-02-04 NOTE — Progress Notes (Signed)
   Covid-19 Vaccination Clinic  Name:  Jonathan Lin    MRN: [id number] DOB: May 28, 1976  02/04/2020  Mr. Jonathan Lin was observed post Covid-19 immunization for 15 minutes without incident. He was provided with Vaccine Information Sheet and instruction to access the V-Safe system.   Mr. Jonathan Lin was instructed to call 911 with any severe reactions post vaccine: Marland Kitchen Difficulty breathing  . Swelling of face and throat  . A fast heartbeat  . A bad rash all over body  . Dizziness and weakness   Immunizations Administered    Name Date Dose VIS Date Route   Pfizer COVID-19 Vaccine 02/04/2020  4:10 PM 0.3 mL 10/23/2019 Intramuscular   Manufacturer: [place]   Lot: [id number]   [place]: [id number]

## 2020-02-26 ENCOUNTER — Telehealth: Payer: Self-pay | Admitting: *Deleted

## 2020-02-26 ENCOUNTER — Encounter: Payer: Self-pay | Admitting: *Deleted

## 2020-02-26 NOTE — Telephone Encounter (Signed)
RN notified by pt's supervisor that pt alerted him to live-in girlfriend pending Covid testing 2/2 URI sx since 02/23/20. Advised pt to leave building and proceed home as he should be quarantining since someone in household is symptomatic and pending testing.   RN spoke with pt by phone. Pt denies any current or previous sx.    Advised pt that until girlfriend's test results back, will have him quarantine same time period as her. Day 1 02/24/20, Day 10 03/04/20. If girlfriend's test negative and pt develops no sx, will plan for him to RTW 03/05/20. No testing at this time unless partner is positive or pt develops sx.   Plan for NP Otila Kluver to f/u with pt and partner over weekend for further plans.   Reviewed possible Covid sx including cough, ShOB, sinusitis sx, sore throat, fever/chills, body aches, fatigue, loss of taste/smell, GI sx n/v/d. Also reviewed same day/emergent eval/ER precautions of dizziness/syncope, confusion, blue tint to lips/face, severe ShOB/difficulty breathing.    Pt verbalizes understanding and agreement with plan of care. No further questions/concerns at this time. Pt reminded to contact clinic with any changes in sx or questions/concerns.      1st Pfizer vaccine received 02/04/20. Scheduled 03/02/20 for 2nd. Advised pt depending on test results and sx status, may need to reschedule this and decision will be made after next f/u regarding this.

## 2020-02-27 NOTE — Telephone Encounter (Signed)
Noted agreed with plan of care

## 2020-02-28 NOTE — Telephone Encounter (Signed)
Telephone message left for patient checking in to see if any questions or concerns no answer but voicemail left.  Discussed RN Hildred Alamin will be in clinic M, Tu, Th and Fr normal hours and I can be reached via my chart or [email or web address]

## 2020-02-29 NOTE — Telephone Encounter (Signed)
Spoke with pt's girlfriend. She reports pt has developed rhinorrhea. Girlfriend's rapid covid test today negative but rescheduled for PCR testing for 4/21 due to worsening sx. Pt scheduled for Covid testing at the same time, 03/02/20 at 10:00 at [facility].

## 2020-03-02 ENCOUNTER — Ambulatory Visit: Payer: Self-pay | Attending: Internal Medicine

## 2020-03-02 ENCOUNTER — Ambulatory Visit: Payer: Self-pay

## 2020-03-02 DIAGNOSIS — Z20822 Contact with and (suspected) exposure to covid-19: Secondary | ICD-10-CM

## 2020-03-04 ENCOUNTER — Encounter: Payer: Self-pay | Admitting: *Deleted

## 2020-03-04 LAB — NOVEL CORONAVIRUS, NAA: SARS-CoV-2, NAA: NOT DETECTED

## 2020-03-04 LAB — SARS-COV-2, NAA 2 DAY TAT

## 2020-03-04 NOTE — Telephone Encounter (Signed)
Girlfriend covid test negative results today PCR.  Patient test results still pending.

## 2020-03-04 NOTE — Telephone Encounter (Signed)
Noted agreed with plan of care

## 2020-03-04 NOTE — Telephone Encounter (Signed)
Spoke with pt by phone. Negative test result given. He reports rhinorrhea still present but mild. Feels well otherwise. Plan to complete quarantine today and RTW on next scheduled workday 4/26. HR Hyacinth Meeker made aware of same. Pt denies any further  Questions/concerns.

## 2020-03-05 ENCOUNTER — Ambulatory Visit: Payer: Self-pay

## 2020-03-06 NOTE — Telephone Encounter (Signed)
Telephone message left for patient calling to ensure symptoms improving and ready to return to work tomorrow and see if he has any questions or concerns.  Notified him I would try back after 6pm.

## 2020-03-08 ENCOUNTER — Encounter: Payer: Self-pay | Admitting: Registered Nurse

## 2020-03-08 ENCOUNTER — Other Ambulatory Visit: Payer: Self-pay

## 2020-03-08 ENCOUNTER — Ambulatory Visit: Payer: Self-pay | Admitting: Registered Nurse

## 2020-03-08 VITALS — BP 131/94 | HR 100 | Temp 97.9°F

## 2020-03-08 DIAGNOSIS — R1012 Left upper quadrant pain: Secondary | ICD-10-CM

## 2020-03-08 DIAGNOSIS — R030 Elevated blood-pressure reading, without diagnosis of hypertension: Secondary | ICD-10-CM

## 2020-03-08 DIAGNOSIS — R809 Proteinuria, unspecified: Secondary | ICD-10-CM

## 2020-03-08 DIAGNOSIS — R202 Paresthesia of skin: Secondary | ICD-10-CM

## 2020-03-08 DIAGNOSIS — R109 Unspecified abdominal pain: Secondary | ICD-10-CM

## 2020-03-08 LAB — POCT URINALYSIS DIPSTICK
Bilirubin, UA: NEGATIVE
Blood, UA: NEGATIVE
Glucose, UA: NEGATIVE mg/dL
Ketones, POC UA: NEGATIVE mg/dL
Leukocytes, UA: NEGATIVE
Nitrite, UA: NEGATIVE
Specific Gravity, UA: 1.02 (ref 1.005–1.030)
Urobilinogen, UA: 0.2 E.U./dL
pH, UA: 6 (ref 5.0–8.0)

## 2020-03-08 NOTE — Telephone Encounter (Signed)
Patient seen in clinic today see office note

## 2020-03-08 NOTE — Patient Instructions (Addendum)
Flank Pain, Adult Flank pain is pain that is located on the side of the body between the upper abdomen and the back. This area is called the flank. The pain may occur over a short period of time (acute), or it may be long-term or recurring (chronic). It may be mild or severe. Flank pain can be caused by many things, including:  Muscle soreness or injury.  Kidney stones or kidney disease.  Stress.  A disease of the spine (vertebral disk disease).  A lung infection (pneumonia).  Fluid around the lungs (pulmonary edema).  A skin rash caused by the chickenpox virus (shingles).  Tumors that affect the back of the abdomen.  Gallbladder disease. Follow these instructions at home:   Drink enough fluid to keep your urine clear or pale yellow.  Rest as told by your health care provider.  Take over-the-counter and prescription medicines only as told by your health care provider.  Keep a journal to track what has caused your flank pain and what has made it feel better.  Keep all follow-up visits as told by your health care provider. This is important. Contact a health care provider if:  Your pain is not controlled with medicine.  You have new symptoms.  Your pain gets worse.  You have a fever.  Your symptoms last longer than 2-3 days.  You have trouble urinating or you are urinating very frequently. Get help right away if:  You have trouble breathing or you are short of breath.  Your abdomen hurts or it is swollen or red.  You have nausea or vomiting.  You feel faint or you pass out.  You have blood in your urine. Summary  Flank pain is pain that is located on the side of the body between the upper abdomen and the back.  The pain may occur over a short period of time (acute), or it may be long-term or recurring (chronic). It may be mild or severe.  Flank pain can be caused by many things.  Contact your health care provider if your symptoms get worse or they last  longer than 2-3 days. This information is not intended to replace advice given to you by your health care provider. Make sure you discuss any questions you have with your health care provider. Document Revised: 10/11/2017 Document Reviewed: 01/11/2017 Elsevier Patient Education  2020 [place]. Kidney Stones  Kidney stones are solid, rock-like deposits that form inside of the kidneys. The kidneys are a pair of organs that make urine. A kidney stone may form in a kidney and move into other parts of the urinary tract, including the tubes that connect the kidneys to the bladder (ureters), the bladder, and the tube that carries urine out of the body (urethra). As the stone moves through these areas, it can cause intense pain and block the flow of urine. Kidney stones are created when high levels of certain minerals are found in the urine. The stones are usually passed out of the body through urination, but in some cases, medical treatment may be needed to remove them. What are the causes? Kidney stones may be caused by:  A condition in which certain glands produce too much parathyroid hormone (primary hyperparathyroidism), which causes too much calcium buildup in the blood.  A buildup of uric acid crystals in the bladder (hyperuricosuria). Uric acid is a chemical that the body produces when you eat certain foods. It usually exits the body in the urine.  Narrowing (stricture) of one  or both of the ureters.  A kidney blockage that is present at birth (congenital obstruction).  Past surgery on the kidney or the ureters, such as gastric bypass surgery. What increases the risk? The following factors may make you more likely to develop this condition:  Having had a kidney stone in the past.  Having a family history of kidney stones.  Not drinking enough water.  Eating a diet that is high in protein, salt (sodium), or sugar.  Being overweight or obese. What are the signs or  symptoms? Symptoms of a kidney stone may include:  Pain in the side of the abdomen, right below the ribs (flank pain). Pain usually spreads (radiates) to the groin.  Needing to urinate frequently or urgently.  Painful urination.  Blood in the urine (hematuria).  Nausea.  Vomiting.  Fever and chills. How is this diagnosed? This condition may be diagnosed based on:  Your symptoms and medical history.  A physical exam.  Blood tests.  Urine tests. These may be done before and after the stone passes out of your body through urination.  Imaging tests, such as a CT scan, abdominal X-ray, or ultrasound.  A procedure to examine the inside of the bladder (cystoscopy). How is this treated? Treatment for kidney stones depends on the size, location, and makeup of the stones. Kidney stones will often pass out of the body through urination. You may need to:  Increase your fluid intake to help pass the stone. In some cases, you may be given fluids through an IV and may need to be monitored at the hospital.  Take medicine for pain.  Make changes in your diet to help prevent kidney stones from coming back. Sometimes, medical procedures are needed to remove a kidney stone. This may involve:  A procedure to break up kidney stones using: ? A focused beam of light (laser therapy). ? Shock waves (extracorporeal shock wave lithotripsy).  Surgery to remove kidney stones. This may be needed if you have severe pain or have stones that block your urinary tract. Follow these instructions at home: Medicines  Take over-the-counter and prescription medicines only as told by your health care provider.  Ask your health care provider if the medicine prescribed to you requires you to avoid driving or using heavy machinery. Eating and drinking  Drink enough fluid to keep your urine pale yellow. You may be instructed to drink at least 8-10 glasses of water each day. This will help you pass the kidney  stone.  If directed, change your diet. This may include: ? Limiting how much sodium you eat. ? Eating more fruits and vegetables. ? Limiting how much animal protein--such as red meat, poultry, fish, and eggs--you eat.  Follow instructions from your health care provider about eating or drinking restrictions. General instructions  Collect urine samples as told by your health care provider. You may need to collect a urine sample: ? 24 hours after you pass the stone. ? 8-12 weeks after passing the kidney stone, and every 6-12 months after that.  Strain your urine every time you urinate, for as long as directed. Use the strainer that your health care provider recommends.  Do not throw out the kidney stone after passing it. Keep the stone so it can be tested by your health care provider. Testing the makeup of your kidney stone may help prevent you from getting kidney stones in the future.  Keep all follow-up visits as told by your health care provider. This is  important. You may need follow-up X-rays or ultrasounds to make sure that your stone has passed. How is this prevented? To prevent another kidney stone:  Drink enough fluid to keep your urine pale yellow. This is the best way to prevent kidney stones.  Eat a healthy diet and follow recommendations from your health care provider about foods to avoid. You may be instructed to eat a low-protein diet. Recommendations vary depending on the type of kidney stone that you have.  Maintain a healthy weight. Where to find more information  [place] [facility]): www.kidney.[facility]): www.urologyhealth.org Contact a health care provider if:  You have pain that gets worse or does not get better with medicine. Get help right away if:  You have a fever or chills.  You develop severe pain.  You develop new abdominal pain.  You faint.  You are unable to urinate. Summary  Kidney stones are solid,  rock-like deposits that form inside of the kidneys.  Kidney stones can cause nausea, vomiting, blood in the urine, abdominal pain, and the urge to urinate frequently.  Treatment for kidney stones depends on the size, location, and makeup of the stones. Kidney stones will often pass out of the body through urination.  Kidney stones can be prevented by drinking enough fluids, eating a healthy diet, and maintaining a healthy weight. This information is not intended to replace advice given to you by your health care provider. Make sure you discuss any questions you have with your health care provider. Document Revised: 03/17/2019 Document Reviewed: 03/17/2019 Elsevier Patient Education  2020 [place]. Dietary Guidelines to Help Prevent Kidney Stones Kidney stones are deposits of minerals and salts that form inside your kidneys. Your risk of developing kidney stones may be greater depending on your diet, your lifestyle, the medicines you take, and whether you have certain medical conditions. Most people can reduce their chances of developing kidney stones by following the instructions below. Depending on your overall health and the type of kidney stones you tend to develop, your dietitian may give you more specific instructions. What are tips for following this plan? Reading food labels  Choose foods with "no salt added" or "low-salt" labels. Limit your sodium intake to less than 1500 mg per day.  Choose foods with calcium for each meal and snack. Try to eat about 300 mg of calcium at each meal. Foods that contain 200-500 mg of calcium per serving include: ? 8 oz (237 ml) of milk, fortified nondairy milk, and fortified fruit juice. ? 8 oz (237 ml) of kefir, yogurt, and soy yogurt. ? 4 oz (118 ml) of tofu. ? 1 oz of cheese. ? 1 cup (300 g) of dried figs. ? 1 cup (91 g) of cooked broccoli. ? 1-3 oz can of sardines or mackerel.  Most people need 1000 to 1500 mg of calcium each day. Talk to  your dietitian about how much calcium is recommended for you. Shopping  Buy plenty of fresh fruits and vegetables. Most people do not need to avoid fruits and vegetables, even if they contain nutrients that may contribute to kidney stones.  When shopping for convenience foods, choose: ? Whole pieces of fruit. ? Premade salads with dressing on the side. ? Low-fat fruit and yogurt smoothies.  Avoid buying frozen meals or prepared deli foods.  Look for foods with live cultures, such as yogurt and kefir. Cooking  Do not add salt to food when cooking. Place a salt shaker  on the table and allow each person to add his or her own salt to taste.  Use vegetable protein, such as beans, textured vegetable protein (TVP), or tofu instead of meat in pasta, casseroles, and soups. Meal planning   Eat less salt, if told by your dietitian. To do this: ? Avoid eating processed or premade food. ? Avoid eating fast food.  Eat less animal protein, including cheese, meat, poultry, or fish, if told by your dietitian. To do this: ? Limit the number of times you have meat, poultry, fish, or cheese each week. Eat a diet free of meat at least 2 days a week. ? Eat only one serving each day of meat, poultry, fish, or seafood. ? When you prepare animal protein, cut pieces into small portion sizes. For most meat and fish, one serving is about the size of one deck of cards.  Eat at least 5 servings of fresh fruits and vegetables each day. To do this: ? Keep fruits and vegetables on hand for snacks. ? Eat 1 piece of fruit or a handful of berries with breakfast. ? Have a salad and fruit at lunch. ? Have two kinds of vegetables at dinner.  Limit foods that are high in a substance called oxalate. These include: ? Spinach. ? Rhubarb. ? Beets. ? Potato chips and french fries. ? Nuts.  If you regularly take a diuretic medicine, make sure to eat at least 1-2 fruits or vegetables high in potassium each day. These  include: ? Avocado. ? Banana. ? Orange, prune, carrot, or tomato juice. ? Baked potato. ? Cabbage. ? Beans and split peas. General instructions   Drink enough fluid to keep your urine clear or pale yellow. This is the most important thing you can do.  Talk to your health care provider and dietitian about taking daily supplements. Depending on your health and the cause of your kidney stones, you may be advised: ? Not to take supplements with vitamin C. ? To take a calcium supplement. ? To take a daily probiotic supplement. ? To take other supplements such as magnesium, fish oil, or vitamin B6.  Take all medicines and supplements as told by your health care provider.  Limit alcohol intake to no more than 1 drink a day for nonpregnant women and 2 drinks a day for men. One drink equals 12 oz of beer, 5 oz of wine, or 1 oz of hard liquor.  Lose weight if told by your health care provider. Work with your dietitian to find strategies and an eating plan that works best for you. What foods are not recommended? Limit your intake of the following foods, or as told by your dietitian. Talk to your dietitian about specific foods you should avoid based on the type of kidney stones and your overall health. Grains Breads. Bagels. Rolls. Baked goods. Salted crackers. Cereal. Pasta. Vegetables Spinach. Rhubarb. Beets. Canned vegetables. Angie Fava. Olives. Meats and other protein foods Nuts. Nut butters. Large portions of meat, poultry, or fish. Salted or cured meats. Deli meats. Hot dogs. Sausages. Dairy Cheese. Beverages Regular soft drinks. Regular vegetable juice. Seasonings and other foods Seasoning blends with salt. Salad dressings. Canned soups. Soy sauce. Ketchup. Barbecue sauce. Canned pasta sauce. Casseroles. Pizza. Lasagna. Frozen meals. Potato chips. [place] fries. Summary  You can reduce your risk of kidney stones by making changes to your diet.  The most important thing you can do is  drink enough fluid. You should drink enough fluid to keep your urine  clear or pale yellow.  Ask your health care provider or dietitian how much protein from animal sources you should eat each day, and also how much salt and calcium you should have each day. This information is not intended to replace advice given to you by your health care provider. Make sure you discuss any questions you have with your health care provider. Document Revised: 02/18/2019 Document Reviewed: 10/09/2016 Elsevier Patient Education  [place]. Abdominal Pain, Adult Pain in the abdomen (abdominal pain) can be caused by many things. Often, abdominal pain is not serious and it gets better with no treatment or by being treated at home. However, sometimes abdominal pain is serious. Your health care provider will ask questions about your medical history and do a physical exam to try to determine the cause of your abdominal pain. Follow these instructions at home:  Medicines  Take over-the-counter and prescription medicines only as told by your health care provider.  Do not take a laxative unless told by your health care provider. General instructions  Watch your condition for any changes.  Drink enough fluid to keep your urine pale yellow.  Keep all follow-up visits as told by your health care provider. This is important. Contact a health care provider if:  Your abdominal pain changes or gets worse.  You are not hungry or you lose weight without trying.  You are constipated or have diarrhea for more than 2-3 days.  You have pain when you urinate or have a bowel movement.  Your abdominal pain wakes you up at night.  Your pain gets worse with meals, after eating, or with certain foods.  You are vomiting and cannot keep anything down.  You have a fever.  You have blood in your urine. Get help right away if:  Your pain does not go away as soon as your health care provider told you to  expect.  You cannot stop vomiting.  Your pain is only in areas of the abdomen, such as the right side or the left lower portion of the abdomen. Pain on the right side could be caused by appendicitis.  You have bloody or black stools, or stools that look like tar.  You have severe pain, cramping, or bloating in your abdomen.  You have signs of dehydration, such as: ? Dark urine, very little urine, or no urine. ? Cracked lips. ? Dry mouth. ? Sunken eyes. ? Sleepiness. ? Weakness.  You have trouble breathing or chest pain. Summary  Often, abdominal pain is not serious and it gets better with no treatment or by being treated at home. However, sometimes abdominal pain is serious.  Watch your condition for any changes.  Take over-the-counter and prescription medicines only as told by your health care provider.  Contact a health care provider if your abdominal pain changes or gets worse.  Get help right away if you have severe pain, cramping, or bloating in your abdomen. This information is not intended to replace advice given to you by your health care provider. Make sure you discuss any questions you have with your health care provider. Document Revised: 03/09/2019 Document Reviewed: 03/09/2019 Elsevier Patient Education  [place].  Proteinuria Proteinuria is when there is too much protein in the urine. Proteins are important for building muscles and bones. Proteins are also needed to fight infections, help the blood to clot, and keep body fluids in balance. Proteinuria may be mild and temporary, or it may be an early sign of  kidney disease. The kidneys make urine. Healthy kidneys also keep substances like proteins from leaving the blood and ending up in the urine. What are the causes? This condition may be caused by damage to the kidneys or by temporary causes such as fever or stress. Proteinuria may happen when the kidneys are not working well. Healthy kidneys have  filters (glomeruli) that keep proteins out of the urine. Proteinuria may mean that the glomeruli are damaged. The main causes of this type of damage are:  Diabetes.  High blood pressure. Other causes of kidney damage can also cause proteinuria, such as:  Diseases of the immune system, such as lupus, rheumatoid arthritis, sarcoidosis, and Goodpasture syndrome.  Heart disease or heart failure.  Kidney infection.  Certain cancers, including kidney cancer, lymphoma, leukemia, and multiple myeloma.  Amyloidosis. This is a disease that causes abnormal proteins to build up in body tissues.  Reactions to certain medicines, such as NSAIDs.  Injuries or poisons (toxins).  High blood pressure that occurs during pregnancy (preeclampsia and eclampsia). Temporary proteinuria may result from conditions that put stress on the kidneys. These conditions usually do not cause kidney damage. They include:  Fever.  Exposure to cold or heat.  Emotional or physical stress.  Extreme exercise.  Standing for long periods of time. What increases the risk? You are more likely to develop this condition if you:  Have diabetes.  Have high blood pressure.  Have heart disease or heart failure.  Have an immune disease, cancer, or other disease that affects the kidneys.  Have a family history of kidney disease.  Are 62 years of age or older.  Are overweight.  Are of African American, American [place], Hispanic/Latino, or [place] descent.  Are pregnant.  Have an infection. What are the signs or symptoms? Mild proteinuria may not cause symptoms. As more proteins enter the urine, symptoms of kidney disease may develop, such as:  Foamy urine.  Swelling of the face, abdomen, hands, legs, or feet (edema).  Needing to urinate frequently.  Fatigue.  Difficulty sleeping.  Dry and itchy skin.  Nausea and vomiting.  Muscle cramps.  Shortness of breath. How is this diagnosed? This  condition may be diagnosed with a urine test. You may have this test as part of a routine physical exam or because you have symptoms of kidney disease or risk factors for kidney disease. You may also have:  Blood tests to measure the level of a certain substance (creatinine) that increases with kidney disease.  Imaging tests of your kidney, such as a CT scan or an ultrasound, to look for signs of kidney damage. How is this treated? If your proteinuria is mild or temporary, treatment may not be needed for this condition. Your health care provider may show you how to monitor the level of protein in your urine at home. Identifying proteinuria early is important so that the cause of the condition can be treated. Treatment for this condition depends on the cause of your proteinuria. Treatment may include:  Making diet and lifestyle changes.  Getting blood pressure under control.  Getting blood sugar under control, if you have diabetes.  Managing any other medical conditions you have that affect your kidneys.  Giving birth, if you are pregnant.  Avoiding medicines that damage your kidneys. In severe cases, kidney disease may need to be treated with medicines or dialysis. Follow these instructions at home: Activity  Return to your normal activities as told by your health care provider. Ask  your health care provider what activities are safe for you.  Ask your health care provider to recommend an exercise program. General instructions  Check your protein levels at home if directed by your health care provider.  Follow instructions from your health care provider about eating or drinking restrictions.  If you are overweight, ask your health care provider about diets that can help you get to a healthy weight.  Take over-the-counter and prescription medicines only as told by your health care provider.  Keep all follow-up visits as told by your health care provider. This is important. Contact  a health care provider if:  You have new symptoms.  Your symptoms get worse or do not improve. Get help right away if you:  Have back pain.  Have diarrhea.  Vomit.  Have a fever.  Have a rash. Summary  Proteinuria is when there is too much protein in the urine.  Proteinuria may be mild and temporary, or it may be an early sign of kidney disease.  This condition may be diagnosed with a urine test.  Treatment for this condition depends on the cause of your proteinuria.  Treatment may include diet and lifestyle changes, blood pressure and blood sugar management, and avoiding medicines that may damage the kidneys. If the proteinuria is severe, it may need to be treated with medicines or dialysis. This information is not intended to replace advice given to you by your health care provider. Make sure you discuss any questions you have with your health care provider. Document Revised: 06/16/2018 Document Reviewed: 06/16/2018 Elsevier Patient Education  [place].

## 2020-03-08 NOTE — Progress Notes (Signed)
Pt scheduled for fasting labs 03/11/20 at 0900.

## 2020-03-08 NOTE — Progress Notes (Signed)
Subjective:    Patient ID: Jonathan Lin, male    DOB: 03-Jul-1976, 44 y.o.   MRN: [id number]  43y/o Caucasian established male pt c/o pain to L flank, present intermittently over several weeks but worse since last night. Denies n/v/d. Denies urinary urgency, frequency, pain with urination, or hematuria.  Had noticed some dark urine in the past month but resolved spontaneously.   Endorses Hx of kidney stone x1 10+ years ago. Does not feel that this feels similar to him. Has been working from home the past week and helping to take care of sick girlfriend the past week up and down quite a bit to bring her things.  Had noticed more flatulence/belching but had drank flavored carbonated water this past week (Bubbly) and decided to stop intake as was new addition to his diet.   Tried seltzer water also but too high in sodium.  His BMs 1-3 per day; patient reported history IBS and number daily stools can vary depending on po intake.  Feeling a little distended today but not more than usual.  Girlfriend with URI symptoms and negative covid testing for patient and girlfriend in past week and no known covid + contacts.  Covid quarantine/work from home 10 days completed this weekend.  Patient reported has dental issues that need repair but didn't have $ or insurance to do so. Patient has not seen a PCM in years and needs to have preventive care performed per patient.   Only recently obtained health insurance.  Denied n/v/d/rectal bleeding.  When patient returned to clinic to discuss labs he reported he would like colon cancer screening/check out tingling/numbness left thigh with prolonged standing and noted he has been having recurrent headaches taking OTC NSAIDS po prn.     Review of Systems  Constitutional: Negative for activity change, appetite change, chills, diaphoresis, fatigue, fever and unexpected weight change.  HENT: Negative for trouble swallowing and voice change.   Eyes: Negative for photophobia, pain,  discharge, redness, itching and visual disturbance.  Respiratory: Negative for cough, shortness of breath, wheezing and stridor.   Cardiovascular: Negative for chest pain and leg swelling.  Gastrointestinal: Positive for abdominal pain. Negative for anal bleeding, blood in stool, constipation, diarrhea, nausea and vomiting.  Endocrine: Negative for cold intolerance and heat intolerance.  Genitourinary: Negative for difficulty urinating.  Musculoskeletal: Negative for gait problem, joint swelling, neck pain and neck stiffness.  Skin: Negative for rash.  Allergic/Immunologic: Negative for environmental allergies and food allergies.  Neurological: Positive for numbness and headaches. Negative for dizziness, tremors, seizures, syncope, facial asymmetry, speech difficulty, weakness and light-headedness.  Hematological: Negative for adenopathy. Does not bruise/bleed easily.  Psychiatric/Behavioral: Negative for agitation, confusion and sleep disturbance.       Objective:   Physical Exam Vitals and nursing note reviewed.  Constitutional:      General: He is awake. He is not in acute distress.    Appearance: Normal appearance. He is well-developed, well-groomed and normal weight. He is not ill-appearing, toxic-appearing or diaphoretic.  HENT:     Head: Normocephalic and atraumatic.     Jaw: There is normal jaw occlusion. No trismus.     Salivary Glands: Right salivary gland is not diffusely enlarged or tender. Left salivary gland is not diffusely enlarged or tender.     Right Ear: Hearing, ear canal and external ear normal. A middle ear effusion is present. There is no impacted cerumen.     Left Ear: Hearing, ear canal and external ear normal.  A middle ear effusion is present. There is no impacted cerumen.     Nose: Mucosal edema, congestion and rhinorrhea present. No nasal deformity, septal deviation, signs of injury, laceration or nasal tenderness.     Right Nostril: No epistaxis or occlusion.      Left Nostril: No epistaxis or occlusion.     Right Turbinates: Swollen. Not enlarged or pale.     Left Turbinates: Swollen. Not enlarged or pale.     Right Sinus: Frontal sinus tenderness present. No maxillary sinus tenderness.     Left Sinus: Frontal sinus tenderness present. No maxillary sinus tenderness.     Comments: Patient pulled away when frontal sinuses palpated bilaterally but denied pain; cobblestoning posterior pharynx; bilateral TMs air fluid level clear; clear discharge bilateral nasal turbinates edema erythema    Mouth/Throat:     Lips: Pink. No lesions.     Mouth: Mucous membranes are moist. Mucous membranes are not pale, not dry and not cyanotic. No injury, lacerations, oral lesions or angioedema.     Dentition: No gingival swelling, dental abscesses or gum lesions.     Tongue: No lesions. Tongue does not deviate from midline.     Palate: No mass and lesions.     Pharynx: Uvula midline. Pharyngeal swelling and posterior oropharyngeal erythema present. No oropharyngeal exudate or uvula swelling.     Tonsils: No tonsillar exudate or tonsillar abscesses. 0 on the right. 0 on the left.  Eyes:     General: Lids are normal. Vision grossly intact. Gaze aligned appropriately. Allergic shiner present. No visual field deficit or scleral icterus.       Right eye: No foreign body, discharge or hordeolum.        Left eye: No foreign body, discharge or hordeolum.     Extraocular Movements: Extraocular movements intact.     Right eye: Normal extraocular motion and no nystagmus.     Left eye: Normal extraocular motion and no nystagmus.     Conjunctiva/sclera: Conjunctivae normal.     Right eye: Right conjunctiva is not injected. No chemosis, exudate or hemorrhage.    Left eye: Left conjunctiva is not injected. No chemosis, exudate or hemorrhage.    Pupils: Pupils are equal, round, and reactive to light. Pupils are equal.     Right eye: Pupil is round and reactive.     Left eye: Pupil  is round and reactive.  Neck:     Thyroid: No thyroid mass, thyromegaly or thyroid tenderness.     Trachea: Trachea and phonation normal. No tracheal tenderness or tracheal deviation.  Cardiovascular:     Rate and Rhythm: Normal rate and regular rhythm.     Chest Wall: PMI is not displaced.     Pulses: Normal pulses.          Radial pulses are 2+ on the right side and 2+ on the left side.     Heart sounds: Normal heart sounds, S1 normal and S2 normal. Heart sounds not distant. No murmur. No friction rub. No gallop. No S3 or S4 sounds.   Pulmonary:     Effort: Pulmonary effort is normal. No respiratory distress.     Breath sounds: Normal breath sounds and air entry. No stridor, decreased air movement or transmitted upper airway sounds. No decreased breath sounds, wheezing, rhonchi or rales.     Comments: Wearing cloth mask due to covid 19 pandemic; spoke full sentences without difficulty; no cough observed in exam room; patient smells like cigarette smoke  on clothing Abdominal:     General: Abdomen is flat. Bowel sounds are decreased. There is no distension or abdominal bruit. There are no signs of injury.     Palpations: Abdomen is soft. There is no shifting dullness, fluid wave, hepatomegaly, splenomegaly, mass or pulsatile mass.     Tenderness: There is abdominal tenderness in the left upper quadrant. There is no right CVA tenderness, left CVA tenderness, guarding or rebound. Negative signs include Murphy's sign.     Hernia: No hernia is present. There is no hernia in the umbilical area or ventral area.       Comments: Dull to percussion x 4 quads; hypoactive bowel sounds x 4 quads; standing to sitting to supine and reverse quickly without assistance on exam room/table/chair  Musculoskeletal:        General: No swelling, tenderness, deformity or signs of injury. Normal range of motion.     Right shoulder: Normal.     Left shoulder: Normal.     Right elbow: Normal.     Left elbow: Normal.      Right hand: Normal.     Left hand: Normal.     Cervical back: Normal, normal range of motion and neck supple. No swelling, edema, deformity, erythema, signs of trauma, lacerations, rigidity, spasms, torticollis, tenderness, bony tenderness or crepitus. No pain with movement, spinous process tenderness or muscular tenderness. Normal range of motion.     Thoracic back: Normal. No swelling, edema, deformity, signs of trauma, lacerations, spasms, tenderness or bony tenderness. Normal range of motion. No scoliosis.     Lumbar back: Normal.     Right hip: Normal.     Left hip: Normal.     Right knee: Normal.     Left knee: Normal.     Right lower leg: No edema.     Left lower leg: No edema.     Right ankle: Normal.     Left ankle: Normal.  Lymphadenopathy:     Head:     Right side of head: No submental, submandibular, tonsillar, preauricular, posterior auricular or occipital adenopathy.     Left side of head: No submental, submandibular, tonsillar, preauricular, posterior auricular or occipital adenopathy.     Cervical: No cervical adenopathy.     Right cervical: No superficial, deep or posterior cervical adenopathy.    Left cervical: No superficial, deep or posterior cervical adenopathy.  Skin:    General: Skin is warm and dry.     Capillary Refill: Capillary refill takes less than 2 seconds.     Coloration: Skin is not ashen, cyanotic, jaundiced, mottled, pale or sallow.     Findings: No abrasion, abscess, acne, bruising, burn, ecchymosis, erythema, signs of injury, laceration, lesion, petechiae, rash or wound.     Nails: There is no clubbing.  Neurological:     General: No focal deficit present.     Mental Status: He is alert and oriented to person, place, and time. Mental status is at baseline.     GCS: GCS eye subscore is 4. GCS verbal subscore is 5. GCS motor subscore is 6.     Cranial Nerves: Cranial nerves are intact. No cranial nerve deficit, dysarthria or facial asymmetry.      Sensory: Sensation is intact. No sensory deficit.     Motor: Motor function is intact. No weakness, tremor, atrophy, abnormal muscle tone or seizure activity.     Coordination: Coordination is intact. Coordination normal.     Gait: Gait is intact.  Gait normal.     Comments: Gait sure and steady in clinic; on/off exam table and in/out of chair without difficulty; bilateral hand grasp equal 5/5  Psychiatric:        Attention and Perception: Attention and perception normal.        Mood and Affect: Mood and affect normal.        Speech: Speech normal.        Behavior: Behavior normal. Behavior is cooperative.        Thought Content: Thought content normal.        Cognition and Memory: Cognition and memory normal.        Judgment: Judgment normal.    Patient returned to clinic at 1333 and discussed lab results with patient and plan of care.  He is not feeling well and planning to leave early today.  Sweat noted on face/hairline  Fasting labs scheduled for Friday with RN Hildred Alamin.  Given printed exitcare handouts on proteinuria, nephrolithiasis and foods to prevent kidney stones, abdomen pain and flank pain reviewed with and given to patient.  Patient reported had two cups coffee and took adderall late today.  Drinking Dr Malachi Bonds now.  Supervisor Aundra Dubin off today so reporting to [facility]. 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 Increase water intake to keep urine pale yellow clear and voiding every 2-4 hours while awake, avoid NSAIDS may take tylenol 1000mg  po q6h prn pain/headache.  Discussed clinic closed tomorrow and RN Hildred Alamin and I will be onsite again 03/10/2020 usual hours.  ER if repetitive n/v/d, worsening pain, unable to void every 8 hours or cola/tea colored urine not lightening in color with increased water intake.  Patient asked if he should drink cranberry juice and notified him high in sugar/high fructose corn syrup typically which can  contribute to kidney stones.  No bacteria seen in urine today so no need for cranberry juice at this time.  Discussed with patient will start with checking labs and follow up 48-72 hours.  I encouraged him to establish care with PCM.  Discussed noted in epic history of elevated LFTs.    Patient verbalized understanding information/instructions and had no further questions at this time.   Reviewed results history and noted last PCM visit 2013  Reviewed care everywhere.  Patient reported he is no longer using illicit drugs  Results for SHAAN, GAYHART (MRN [id number]) as of 03/08/2020 20:23  Ref. Range 10/29/2013 05:15 10/29/2013 05:27 12/24/2013 17:44 12/24/2013 23:38 03/24/2015 21:22 03/24/2015 22:16  Alcohol, Ethyl (B) Latest Ref Range: <5 mg/dL  <11 [id number]  99991111 (H)   Salicylate Lvl Latest Ref Range: 2.8 - 30.0 mg/dL   <2.0 (L)  <4.0   Amphetamines Latest Ref Range: NONE DETECTED  NONE DETECTED   NONE DETECTED  NONE DETECTED  Barbiturates Latest Ref Range: NONE DETECTED  NONE DETECTED   NONE DETECTED  NONE DETECTED  Benzodiazepines Latest Ref Range: NONE DETECTED  NONE DETECTED   NONE DETECTED  NONE DETECTED  Opiates Latest Ref Range: NONE DETECTED  NONE DETECTED   NONE DETECTED  NONE DETECTED  COCAINE Latest Ref Range: NONE DETECTED  NONE DETECTED   NONE DETECTED  POSITIVE (A)  Tetrahydrocannabinol Latest Ref Range: NONE DETECTED  NONE DETECTED   POSITIVE (A)  NONE DETECTED      Assessment & Plan:  A-abdomen pain LUQ acute initial visit; flank pain acute left; acute proteinuria, elevated blood pressure  P-PE unremarkable.  Patient  reports history IBS and kidney stones.  PMHx drug abuse and alcohol dependence.  Patient will have fasting labs drawn (exec panel plus amylase and lipase) when able to schedule with RN Hildred Alamin.   I have recommended noncarbonated clear fluids and bland diet.  Avoid dairy/spicy, fried and large portions of meat while having stomach upset.  If vomiting starts hold po intake x 1 hour.   Then sips clear fluids like broths, ginger ale, power ade, gatorade, pedialyte may advance to soft/bland if no vomiting x 24 hours and appetite returned otherwise hydration main focus. Return to the clinic if symptoms persist or worsen; I have alerted the patient to call if high fever, dehydration, marked weakness, fainting, increased abdominal pain, blood in stool or vomit (red or black).   Exitcare handout printed and given on abdomen and flank pain, nephrolithiasis and foods to avoid.  Discussed with patient mildly dehydrated today based on urinalysis sg.  Coffee/Tea are diuretic recommended water/no added sugar gatorade/nondairy popsicles/ginger ale/broths first line if possible.   Exec panel with lipase and amylase ordered fasting.  Consider repeat ua and culture if new or worsening urinary symptoms.  Patient verbalized agreement and understanding of treatment plan and had no further questions at this time.   Discussed urinalysis results with patient trace proteinuria.  Hydrate, hydrate. Consider adding Flomax 0.4mg  daily and urology consult if labs show elevated BUN/Cr or decreased GFR.  Call or return to clinic as needed if these symptoms worsen or fail to improve as anticipated e.g. gross hematuria, fever, worsening pain, unable to void every 8 hours or tolerate po intake despite zofran and having symptoms of dehydration e.g. dizzyness, unable to void. Exitcare handout on nephrolithiasis and foods to prevent nephrolithiasis printed and given to patient. Patient verbalized agreement and understanding of treatment plan and had no further questions at this time. P2: Hydrate  Encouraged him to decrease caffeine intake as on adderall.  Discussed ER if chest pain, worst headache of life, dyspnea or visual changes for re-evaluation.  Will see RN Hildred Alamin this week for repeat BP check with lab draw.  Discussed pain and caffeine can both increase blood pressure.  Patient verbalized understanding  information/instructions, agreed with plan of care and had no further questions at this time.  Paresthesias and headaches will be addressed at follow up visit.  Headaches could be due to work stressors as patient trying to work more hours/get overtime, recently out of work for [employer] precaution girlfriend URI symptoms and they live together.  Elevated blood pressure could be source of headaches recurrent also.  Discussed checking B6, B12,  electrolytes and TSH and blood sugar for initial paresthesias work up. Encouraged establishing care with new PCM/new insurance.  Patient verbalized understanding information/instructions, agreed with plan of care and had no further questions at this time.

## 2020-03-08 NOTE — Progress Notes (Signed)
Noted fasting labs scheduled in 72 hours

## 2020-03-11 ENCOUNTER — Ambulatory Visit: Payer: Self-pay | Admitting: *Deleted

## 2020-03-11 ENCOUNTER — Other Ambulatory Visit: Payer: Self-pay

## 2020-03-11 DIAGNOSIS — R1012 Left upper quadrant pain: Secondary | ICD-10-CM

## 2020-03-11 DIAGNOSIS — R809 Proteinuria, unspecified: Secondary | ICD-10-CM

## 2020-03-11 DIAGNOSIS — R202 Paresthesia of skin: Secondary | ICD-10-CM

## 2020-03-11 DIAGNOSIS — R109 Unspecified abdominal pain: Secondary | ICD-10-CM

## 2020-03-11 NOTE — Progress Notes (Signed)
Labs per 03/08/20 OV orders

## 2020-03-12 LAB — CMP12+LP+TP+TSH+6AC+PSA+CBC?
ALT: 64 IU/L — ABNORMAL HIGH (ref 0–44)
AST: 27 IU/L (ref 0–40)
Bilirubin Total: 0.3 mg/dL (ref 0.0–1.2)
Chloride: 102 mmol/L (ref 96–106)
Creatinine, Ser: 0.82 mg/dL (ref 0.76–1.27)
EOS (ABSOLUTE): 0.3 10*3/uL (ref 0.0–0.4)
GFR calc Af Amer: 124 mL/min/{1.73_m2} (ref >59)
Hematocrit: 44.5 % (ref 37.5–51.0)
Immature Granulocytes: 0 %
Iron: 143 ug/dL (ref 38–169)
LDH: 143 IU/L (ref 121–224)
LDL Chol Calc (NIH): 108 mg/dL — ABNORMAL HIGH (ref 0–99)
Lymphocytes Absolute: 2 10*3/uL (ref 0.7–3.1)
MCHC: 34.4 g/dL (ref 31.5–35.7)
Monocytes Absolute: 0.5 10*3/uL (ref 0.1–0.9)
Monocytes: 8 %
RBC: 4.88 x10E6/uL (ref 4.14–5.80)
RDW: 12.3 % (ref 11.6–15.4)
T3 Uptake Ratio: 27 % (ref 24–39)
T4, Total: 6.1 ug/dL (ref 4.5–12.0)
WBC: 6.3 10*3/uL (ref 3.4–10.8)

## 2020-03-12 LAB — CMP12+LP+TP+TSH+6AC+PSA+CBC…
Albumin/Globulin Ratio: 1.6 (ref 1.2–2.2)
Albumin: 4.3 g/dL (ref 4.0–5.0)
Alkaline Phosphatase: 55 IU/L (ref 39–117)
BUN/Creatinine Ratio: 17 (ref 9–20)
BUN: 14 mg/dL (ref 6–24)
Basophils Absolute: 0 10*3/uL (ref 0.0–0.2)
Basos: 1 %
Calcium: 9.3 mg/dL (ref 8.7–10.2)
Chol/HDL Ratio: 3.2 ratio (ref 0.0–5.0)
Cholesterol, Total: 180 mg/dL (ref 100–199)
Eos: 4 %
Estimated CHD Risk: <0.5 times avg. (ref 0.0–1.0)
Free Thyroxine Index: 1.6 (ref 1.2–4.9)
GFR calc non Af Amer: 108 mL/min/{1.73_m2} (ref >59)
GGT: 37 IU/L (ref 0–65)
Globulin, Total: 2.7 g/dL (ref 1.5–4.5)
Glucose: 92 mg/dL (ref 65–99)
HDL: 57 mg/dL (ref >39)
Hemoglobin: 15.3 g/dL (ref 13.0–17.7)
Immature Grans (Abs): 0 10*3/uL (ref 0.0–0.1)
Lymphs: 33 %
MCH: 31.4 pg (ref 26.6–33.0)
MCV: 91 fL (ref 79–97)
Neutrophils Absolute: 3.4 10*3/uL (ref 1.4–7.0)
Neutrophils: 54 %
Phosphorus: 3.2 mg/dL (ref 2.8–4.1)
Platelets: 216 10*3/uL (ref 150–450)
Potassium: 4.3 mmol/L (ref 3.5–5.2)
Prostate Specific Ag, Serum: 1.4 ng/mL (ref 0.0–4.0)
Sodium: 137 mmol/L (ref 134–144)
TSH: 0.953 u[IU]/mL (ref 0.450–4.500)
Total Protein: 7 g/dL (ref 6.0–8.5)
Triglycerides: 84 mg/dL (ref 0–149)
Uric Acid: 5.4 mg/dL (ref 3.8–8.4)
VLDL Cholesterol Cal: 15 mg/dL (ref 5–40)

## 2020-03-12 LAB — AMYLASE: Amylase: 53 U/L (ref 31–110)

## 2020-03-12 LAB — HGB A1C W/O EAG: Hgb A1c MFr Bld: 5.3 % (ref 4.8–5.6)

## 2020-03-12 LAB — LIPASE: Lipase: 35 U/L (ref 13–78)

## 2020-03-15 ENCOUNTER — Ambulatory Visit: Payer: Self-pay | Attending: Internal Medicine

## 2020-03-15 DIAGNOSIS — Z23 Encounter for immunization: Secondary | ICD-10-CM

## 2020-03-15 NOTE — Progress Notes (Signed)
   Covid-19 Vaccination Clinic  Name:  Jonathan Lin    MRN: [id number] DOB: 1976-10-13  03/15/2020  [name] was observed post Covid-19 immunization for 15 minutes without incident. He was provided with Vaccine Information Sheet and instruction to access the V-Safe system.   [name] was instructed to call 911 with any severe reactions post vaccine: Marland Kitchen Difficulty breathing  . Swelling of face and throat  . A fast heartbeat  . A bad rash all over body  . Dizziness and weakness   Immunizations Administered    Name Date Dose VIS Date Route   Pfizer COVID-19 Vaccine 03/15/2020  8:20 AM 0.3 mL 01/06/2019 Intramuscular   Manufacturer: [place]   Lot: [id number]   [place]: [id number]

## 2020-03-16 NOTE — Telephone Encounter (Signed)
Telephone message left for patient follow up to see if appt scheduled with PCM.  If symptoms improving or worsening.  He can send me message in [place] or email [email or web address] or call clinic tomorrow [phone].

## 2020-07-04 ENCOUNTER — Ambulatory Visit: Payer: Self-pay | Admitting: Physician Assistant

## 2020-07-27 ENCOUNTER — Ambulatory Visit: Payer: Self-pay | Admitting: Physician Assistant

## 2020-08-01 ENCOUNTER — Encounter: Payer: Self-pay | Admitting: *Deleted

## 2020-08-01 ENCOUNTER — Telehealth: Payer: Self-pay | Admitting: *Deleted

## 2020-08-01 NOTE — Telephone Encounter (Signed)
Pt's girlfriend called RN reporting her own Covid sx as she is also a [employer] representative. She reported Jonathan Lin's sx as well. For him, his sx began last night 07/31/20, with fever around 100F, chills, body aches. Same sx as girlfriend. Taking ibuprofen and drinking extra fluids at home.  Last day on-site: unknown Day 1 of Sx: 07/31/20 Day 1 of quarantine: 08/01/20 Testing between sx days 3-5 Test scheduled 08/03/20 at [facility] at 1450 Day 10 quarantine: 08/10/20 RTW date: 08/11/20 Dates subject to decrease to 7 day quarantine if sx improve and test is negative. Will plan to f/u on Fri 9/24 for anticipated available results.

## 2020-08-01 NOTE — Telephone Encounter (Signed)
Noted start home quarantine due to fever, chills, body aches started last night per CDC guidelines.  Patient fully covid vaccinated.  Girlfriend sick with similar symptoms otherwise no known covid positive contacts.  Testing scheduled for 08/03/2020.  RTW 08/11/2020.  Will follow up with virtual visit tomorrow when NP in clinic.

## 2020-08-05 NOTE — Telephone Encounter (Signed)
Noted symptoms resolved except fatigue negative covid test finish 7 day quarantine and expected RTW 08 Aug 2020

## 2020-08-05 NOTE — Telephone Encounter (Signed)
Spoke with pt over phone. He received text of negative Covid test result this morning. His sx that started 9/19 resolved by 9/21. He is currently asymptomatic except fatigue which he attributes to his sleep routine being off for the past week. Since vaxxed and resolved sx, he is eligible for 7 day quarantine which ends 9/26 and can RTW on-site 9/27. HR made aware of same.

## 2020-08-07 NOTE — Telephone Encounter (Signed)
Telephone message left for patient to verify no new symptoms and planning to RTW tomorrow.  He may email me at [email or web address] or call RN [employer] tomorrow in clinic [phone].

## 2020-08-08 NOTE — Telephone Encounter (Signed)
Noted patient RTW today.

## 2020-08-08 NOTE — Telephone Encounter (Signed)
Pt RTW today as expected per supervisor.

## 2021-01-04 ENCOUNTER — Telehealth: Payer: Self-pay | Admitting: Registered Nurse

## 2021-01-04 ENCOUNTER — Encounter: Payer: Self-pay | Admitting: Registered Nurse

## 2021-01-04 DIAGNOSIS — K5900 Constipation, unspecified: Secondary | ICD-10-CM

## 2021-01-04 DIAGNOSIS — R1084 Generalized abdominal pain: Secondary | ICD-10-CM
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
## 2021-01-24 NOTE — Telephone Encounter (Signed)
Patient contacted via telephone to follow up symptoms.  Patient reported abdomen pain has improved back to baseline but not completely resolved.  He isn't concerned at this time doesn't want appt this week.  Discussed annual labs due next month and if worsening symptoms should schedule appt with me or PCM for re-evaluation.  Patient verbalized understanding information/instructions, agreed with plan of care and had no further questions at this time.

## 2021-02-10 ENCOUNTER — Encounter: Payer: Self-pay | Admitting: *Deleted

## 2021-02-10 ENCOUNTER — Telehealth: Payer: Self-pay | Admitting: *Deleted

## 2021-02-10 NOTE — Telephone Encounter (Signed)
Reviewed RN Hildred Alamin note agreed with plan of care will follow up with patient this weekend.

## 2021-02-10 NOTE — Telephone Encounter (Signed)
RN notified by pt's girlfriend that pt woke up with possible Covid vs flu sx this morning. Spoke with pt by phone. He reports waking with sore throat, body aches, fatigue, diarrhea, chills, runny nose. Felt fine last night, maybe some fatigue but unsure. He did a rapid home test this morning that was negative. Advised pt it was likely too early to test. Scheduled for Covid and flu testing at [facility] Sunday 4/3 at 0920. Recommended hydration, rest, Tylenol, and Imodium for his current symptoms. Must be without v/d, fever without meds for >24hr prior to RTW onsite.  Day 0 4/1 Day 5 4/6 RTW 4/7 with strict mask use thru 4/11.

## 2021-02-12 NOTE — Telephone Encounter (Signed)
Patient contacted via telephone stated missed his test appt and went today to [facility] for [facility] testing.  Having nasal congestion taking tylenol OTC prn.  Denied other concerns or questions.  Discussed no return onsite until test results obtained from [facility].  Consider use normal saline nasal spray 2 sprays each nostril q2h wa as needed. flonase 16mcg 1 spray each nostril BID OTC.  Discussed nasal saline should feel drip of fluid in throat wait 15 minutes then blow nose well.  Flonase very light inhale want medicine to stay in nares/sinuses not pulled into throat.  Administer after blowing nose after nasal saline use. Discussed with patient RN Hildred Alamin will call him tomorrow afternoon or he can call her when he receives test results to [phone]  Nasal congestion noted during telephone call duration 3 minutes no cough or throat clearing audible.  Spoke full sentences without difficulty. Patient verbalized understanding of instructions, agreed with plan of care and had no further questions at this time. HR notified patient not cleared to return onsite tested today and awaiting covid test results still symptomatic. P2:  Avoidance and hand washing.

## 2021-02-13 NOTE — Telephone Encounter (Signed)
Spoke with pt by phone. He reports no test results back. Has not been using any nasal sprays, only hot showers. Sx still with runny nose, nasal congestion, diarrhea. Also with intermittent sweating per pt but has not checked temp at home since Friday. Advised him to check daily since he will need to be fever free for 24hr prior to RTW. Took Tylenol yesterday but no other meds on board. Must also have no v/d for 24hr. So pt is not cleared to RTW 4/5. Will re-eval tomorrow.

## 2021-02-13 NOTE — Telephone Encounter (Signed)
Reviewed RN Hildred Alamin note patient still symptomatic and awaiting covid test results not cleared to return onsite/agreed with plan of care for re-evaluation tomorrow.

## 2021-02-14 NOTE — Telephone Encounter (Signed)
Reviewed RN Hildred Alamin note agreed with plan of care.  Reviewed Epic Type A flu test positive; covid and flu type B negative.  Patient to wear mask at work this week when returns to prevent spread of influenza in [employer].  Symptoms improved.  Discussed with RN Hildred Alamin verbally in clinic today when she was speaking with patient on telephone.

## 2021-02-14 NOTE — Telephone Encounter (Signed)
Spoke with pt by phone. He reports symptoms improving. Diarrhea resolved yesterday. Denies fever at home. Still slightly congested. No OTC meds since yesterday. Cleared to RTW tomorrow 4/6.

## 2021-02-15 NOTE — Telephone Encounter (Signed)
Confirmed with pt he did RTW today 4/6 as planned. Feels well. Denies needs or concerns. Closing encounter.

## 2021-02-16 NOTE — Telephone Encounter (Signed)
Noted patient returned to work 02/15/21 feeling well  Reviewed RN Hildred Alamin note and agreed with plan of care.

## 2021-03-21 ENCOUNTER — Ambulatory Visit: Payer: Self-pay | Admitting: Registered Nurse

## 2021-03-21 ENCOUNTER — Other Ambulatory Visit: Payer: Self-pay

## 2021-03-21 ENCOUNTER — Encounter: Payer: Self-pay | Admitting: Registered Nurse

## 2021-03-21 VITALS — BP 126/87 | HR 95 | Temp 98.3°F

## 2021-03-21 DIAGNOSIS — S80862A Insect bite (nonvenomous), left lower leg, initial encounter: Secondary | ICD-10-CM

## 2021-03-21 MED ORDER — DOXYCYCLINE HYCLATE 100 MG PO TABS: 200.0000 mg | ORAL_TABLET | Freq: Once | ORAL | 0 refills | Status: AC

## 2021-03-21 NOTE — Progress Notes (Signed)
Subjective:    Patient ID: Jonathan Lin, male    DOB: [birth year], 45 y.o.   MRN: [id number]  44y/o Caucasian established single male pt reporting tick bite Saturday after working in yard. Noticed tick on upper R leg engorged and removed it on Sunday morning. Attached less than 24 hr most likely. Small area of erythema surrounding bite area. No bullseye rash. Pt endorses fatigue. Day prior patient felt like he was bitten by something but couldn't find insect.  Tick removed by patient with pliers.  Denied fever/chills/purulent discharge/rash on other body parts/trouble walking.     Review of Systems  Constitutional: Positive for fatigue. Negative for activity change, appetite change, chills, diaphoresis and fever.  HENT: Negative for trouble swallowing and voice change.   Eyes: Negative for photophobia and visual disturbance.  Respiratory: Negative for shortness of breath, wheezing and stridor.   Cardiovascular: Negative for palpitations and leg swelling.  Gastrointestinal: Negative for diarrhea, nausea and vomiting.  Endocrine: Negative for cold intolerance and heat intolerance.  Genitourinary: Negative for difficulty urinating.  Musculoskeletal: Negative for gait problem, neck pain and neck stiffness.  Skin: Positive for color change and rash.  Allergic/Immunologic: Negative for food allergies.  Neurological: Negative for dizziness, tremors, seizures, syncope, facial asymmetry, speech difficulty, weakness, light-headedness, numbness and headaches.  Hematological: Negative for adenopathy. Does not bruise/bleed easily.  Psychiatric/Behavioral: Negative for agitation, confusion and sleep disturbance.       Objective:   Physical Exam Vitals and nursing note reviewed.  Constitutional:      General: He is awake. He is not in acute distress.    Appearance: Normal appearance. He is well-developed, well-groomed and normal weight. He is not ill-appearing, toxic-appearing or diaphoretic.   HENT:     Head: Normocephalic and atraumatic.     Jaw: There is normal jaw occlusion.     Right Ear: Hearing and external ear normal.     Left Ear: Hearing and external ear normal.     Nose: Nose normal. No congestion or rhinorrhea.     Mouth/Throat:     Lips: Pink. No lesions.     Mouth: Mucous membranes are moist.     Pharynx: Oropharynx is clear.  Eyes:     General: Lids are normal. Vision grossly intact. Gaze aligned appropriately. Allergic shiner present. No visual field deficit or scleral icterus.       Right eye: No discharge.        Left eye: No discharge.     Extraocular Movements: Extraocular movements intact.     Conjunctiva/sclera: Conjunctivae normal.     Pupils: Pupils are equal, round, and reactive to light.  Neck:     Trachea: Phonation normal. No tracheal deviation.  Cardiovascular:     Rate and Rhythm: Normal rate and regular rhythm.     Pulses: Normal pulses.          Radial pulses are 2+ on the right side and 2+ on the left side.  Pulmonary:     Effort: Pulmonary effort is normal. No respiratory distress.     Breath sounds: Normal breath sounds and air entry. Stridor present. No wheezing.     Comments: Spoke full sentences without difficulty; no cough observed in exam room Abdominal:     General: Abdomen is flat. There is no distension.     Palpations: Abdomen is soft.  Musculoskeletal:        General: Tenderness and signs of injury present. No swelling or deformity. Normal range of  motion.     Right shoulder: No swelling, deformity, effusion or laceration.     Left shoulder: No swelling, deformity, effusion or laceration.     Right elbow: No swelling, deformity or effusion.     Left elbow: No swelling, deformity or effusion.     Right hand: No swelling, deformity or lacerations.     Left hand: No swelling, deformity or lacerations.     Cervical back: Normal range of motion and neck supple. No swelling, edema, deformity, erythema, signs of trauma,  lacerations, rigidity, torticollis or crepitus.     Thoracic back: No swelling, edema, deformity, signs of trauma or lacerations.     Lumbar back: No swelling, edema, deformity, signs of trauma or lacerations.     Right hip: No deformity, lacerations or crepitus. Normal strength.     Left hip: No deformity, lacerations or crepitus. Normal strength.     Right upper leg: Tenderness present. No swelling, edema, deformity, lacerations or bony tenderness.     Right lower leg: No swelling, deformity or lacerations. No edema.     Left lower leg: No swelling, deformity or lacerations. No edema.  Lymphadenopathy:     Head:     Right side of head: No preauricular adenopathy.     Left side of head: No preauricular adenopathy.     Cervical: No cervical adenopathy.     Right cervical: No superficial cervical adenopathy.    Left cervical: No superficial cervical adenopathy.  Skin:    General: Skin is warm and dry.     Capillary Refill: Capillary refill takes less than 2 seconds.     Coloration: Skin is not ashen, cyanotic, jaundiced, mottled, pale or sallow.     Findings: Abrasion, bruising, ecchymosis, erythema, signs of injury, lesion and rash present. No abscess, acne, burn, laceration, petechiae or wound. Rash is macular and papular. Rash is not crusting, nodular, purpuric, pustular, scaling, urticarial or vesicular.     Nails: There is no clubbing.       Neurological:     General: No focal deficit present.     Mental Status: He is alert and oriented to person, place, and time. Mental status is at baseline.     GCS: GCS eye subscore is 4. GCS verbal subscore is 5. GCS motor subscore is 6.     Cranial Nerves: Cranial nerves are intact. No cranial nerve deficit, dysarthria or facial asymmetry.     Sensory: Sensation is intact. No sensory deficit.     Motor: Motor function is intact. No weakness, tremor, atrophy, abnormal muscle tone or seizure activity.     Coordination: Coordination is intact.  Coordination normal.     Gait: Gait is intact. Gait normal.     Comments: Gait sure and steady in clinic; in/out of chair and on/off exam table without difficulty; bilateral hand grasp equal 5/5  Psychiatric:        Attention and Perception: Attention and perception normal.        Mood and Affect: Mood and affect normal.        Speech: Speech normal.        Behavior: Behavior normal. Behavior is cooperative.        Thought Content: Thought content normal.        Cognition and Memory: Cognition and memory normal.        Judgment: Judgment normal.           Assessment & Plan:  A-tick bite initial encounter  P-Dispensed doxycycline from [facility] and discussed will have left over pills and to use protective clothing/sunscreen this week as side effect may sunburn easier.  Did not require oral steroids.  One small lesions.  Less than 72 hours since last tick removed utilizing pliers. Symptoms do not match STARI/RMSF but could be consistent with lyme and ehrlichosis per up to date.  Lyme disease would require doxycycline 100mg  po BID round of antibiotics.  Avoid scratching and picking at areas ticks removed.  May apply calagel 2% apply BID prn itching and triple antibiotic topical BID or aquaphor/eucerin/vaseline to speed healing.  Wash affected areas daily with soap and water.  May apply ice 15 minutes po TID prn pain/swelling.  Exitcare handouts on tick bite, and insect bite printed and given to patient.  If rash has worsening erythema, pain, purulent discharge, fever to notify clinic staff/seek re-evaluation with provider. I recommend treatment of clothes with permethrin and using DEET on skin that is not covered when working in yard  Patient verbalized understanding, agreed with plan of care and had no further questions at this time.   Patient not ready to quit smoking at this time.

## 2021-03-21 NOTE — Patient Instructions (Signed)
Insect Bite, Adult An insect bite can make your skin red, itchy, and swollen. An insect bite is different from an insect sting, which happens when an insect injects poison (venom) into the skin. Some insects can spread disease to people through a bite. However, most insect bites do not lead to disease and are not serious. What are the causes? Insects may bite for a variety of reasons, including:  Hunger.  To defend themselves. Insects that bite include:  Spiders.  Mosquitoes.  Ticks.  Fleas.  Ants.  Flies.  Kissing bugs.  Chiggers. What are the signs or symptoms? Symptoms of this condition include:  Itching or pain in the bite area.  Redness and swelling in the bite area.  An open wound (skin ulcer). In many cases, symptoms last for 2-4 days. In rare cases, a person may have a severe allergic reaction (anaphylactic reaction) to a bite. Symptoms of an anaphylactic reaction may include:  Feeling warm in the face (flushed). This may include redness.  Itchy, red, swollen areas of skin (hives).  Swelling of the eyes, lips, face, mouth, tongue, or throat.  Difficulty breathing, speaking, or swallowing.  Noisy breathing (wheezing).  Dizziness or light-headedness.  Fainting.  Pain or cramping in the abdomen.  Vomiting.  Diarrhea. How is this diagnosed? This condition is usually diagnosed based on symptoms and a physical exam. How is this treated? Treatment is usually not needed. Symptoms often go away on their own. When treatment is recommended, it may involve:  Applying a cream or lotion to the bite area. This treatment helps with itching.  Taking an antibiotic medicine. This treatment is needed if the bite area gets infected.  Getting a tetanus shot, if you are not up to date on this vaccine.  Applying ice to the affected area.  Allergy medicines called antihistamines. This treatment may be needed if you develop itching or an allergic reaction to the  insect bite.  Giving yourself an epinephrine injection if you have an anaphylactic reaction to a bite. To give the injection, you will use what is commonly called an auto-injector "pen" (pre-filled automatic epinephrine injection device). Your health care provider will teach you how to use an auto-injector pen. Follow these instructions at home: Bite area care  Do not scratch the bite area.  Keep the bite area clean and dry. Wash it every day with soap and water as told by your health care provider.  Check the bite area every day for signs of infection. Check for: ? Redness, swelling, or pain. ? Fluid or blood. ? Warmth. ? Pus or a bad smell.   Managing pain, itching, and swelling  You may apply cortisone cream, calamine lotion, or a paste made of baking soda and water to the bite area as told by your health care provider.  If directed, put ice on the bite area. ? Put ice in a plastic bag. ? Place a towel between your skin and the bag. ? Leave the ice on for 20 minutes, 2-3 times a day.   General instructions  Apply or take over-the-counter and prescription medicines only as told by your health care provider.  If you were prescribed an antibiotic medicine, take or apply it as told by your health care provider. Do not stop using the antibiotic even if your condition improves.  Keep all follow-up visits as told by your health care provider. This is important. How is this prevented? To help reduce your risk of insect bites:  When you  are outdoors, wear clothing that covers your arms and legs. This is especially important in the early morning and evening.  Use insect repellent. The best insect repellents contain DEET, picaridin, oil of lemon eucalyptus (OLE), or IR3535.  Consider spraying your clothing with a pesticide called permethrin. Permethrin helps prevent insect bites. It works for several weeks and for up to 5-6 clothing washes. Do not apply permethrin directly to the  skin.  If your home windows do not have screens, consider installing them.  If you will be sleeping in an area where there are mosquitoes, consider covering your sleeping area with a mosquito net. Contact a health care provider if:  You have redness, swelling, or pain in the bite area.  You have fluid or blood coming from the bite area.  The bite area feels warm to the touch.  You have pus or a bad smell coming from the bite area.  You have a fever. Get help right away if:  You have joint pain.  You have a rash.  You feel unusually tired or sleepy.  You have neck pain.  You have a headache.  You have unusual weakness.  You develop symptoms of an anaphylactic reaction. These may include: ? Flushed skin. ? Hives. ? Swelling of the eyes, lips, face, mouth, tongue, or throat. ? Difficulty breathing, speaking, or swallowing. ? Wheezing. ? Dizziness or light-headedness. ? Fainting. ? Pain or cramping in the abdomen. ? Vomiting. ? Diarrhea. These symptoms may represent a serious problem that is an emergency. Do not wait to see if the symptoms will go away. Do the following right away:  Use the auto-injector pen as you have been instructed.  Get medical help. Call your local emergency services (911 in the [place] Do not drive yourself to the hospital. Summary  An insect bite can make your skin red, itchy, and swollen.  Treatment is usually not needed. Symptoms often go away on their own. When treatment is recommended, it may involve taking medicine, applying medicine to the area, or applying ice.  Apply or take over-the-counter and prescription medicines only as told by your health care provider.  Use insect repellent to help prevent insect bites.  Contact a health care provider if you have any signs of infection in the bite area. This information is not intended to replace advice given to you by your health care provider. Make sure you discuss any questions you have  with your health care provider. Document Revised: 05/09/2018 Document Reviewed: 05/09/2018 Elsevier Patient Education  2021 [place], Adult Ticks are insects that draw blood for food. Most ticks live in shrubs and grassy and wooded areas. They climb onto people and animals that brush against the leaves and grasses that they rest on. Then they bite, attaching themselves to the skin. Most ticks are harmless, but some ticks may carry germs that can spread to a person through a bite and cause a disease. To reduce your risk of getting a disease from a tick bite, make sure you:  Take steps to prevent tick bites.  Check for ticks after being outdoors where ticks live.  Watch for symptoms of disease if a tick attached to you or if you suspect a tick bite. How can I prevent tick bites? Take these steps to help prevent tick bites when you go outdoors in an area where ticks live: Use insect repellent  Use insect repellent that has DEET (20% or higher), picaridin, or IR3535 in it.  Follow the instructions on the label. Use these products on: ? Bare skin. ? The top of your boots. ? Your pant legs. ? Your sleeve cuffs.  For insect repellent that contains permethrin, follow the instructions on the label. Use these products on: ? Clothing. ? Boots. ? Outdoor gear. ? Tents. When you are outside  Wear protective clothing. Long sleeves and long pants offer the best protection from ticks.  Wear light-colored clothing so you can see ticks more easily.  Tuck your pant legs into your socks.  If you go walking on a trail, stay in the middle of the trail so your skin, hair, and clothing do not touch the bushes.  Avoid walking through areas with long grass.  Check for ticks on your clothing, hair, and skin often while you are outside, and check again before you go inside. Make sure to check the scalp, neck, armpits, waist, groin, and joint areas. These are the spots where ticks  attach themselves most often. When you go indoors  Check your clothing for ticks. Tumble dry clothes in a dryer on high heat for at least 10 minutes. If clothes are damp, additional time may be needed. If clothes require washing, use hot water.  Examine gear and pets.  Shower soon after being outdoors.  Check your body for ticks. Conduct a full body check using a mirror. What is the proper way to remove a tick? If you find a tick on your body, remove it as soon as possible. Removing a tick sooner can prevent germs from passing to your body. Do not remove the tick with your bare fingers. To remove a tick that is crawling on your skin but has not bitten, use either of these methods:  Go outdoors and brush the tick off.  Remove the tick with tape or a lint roller. To remove a tick that is attached to your skin: 1. Wash your hands. If you have latex gloves, put them on. 2. Use fine-tipped tweezers, curved forceps, or a tick-removal tool to gently grasp the tick as close to your skin and the tick's head as possible. 3. Gently pull with a steady, upward, even pressure until the tick lets go. 4. When removing the tick: ? Take care to keep the tick's head attached to its body. ? Do not twist or jerk the tick. This can make the tick's head or mouth parts break off and remain in the skin. ? Do not squeeze or crush the tick's body. This could force disease-carrying fluids from the tick into your body. Do not try to remove a tick with heat, alcohol, petroleum jelly, or fingernail polish. Using these methods can cause the tick to salivate and regurgitate into your bloodstream, increasing your risk of getting a disease.   What should I do after removing a tick?  Dispose of the tick. Do not crush a tick with your fingers.  Clean the bite area and your hands with soap and water, rubbing alcohol, or an iodine scrub.  If an antiseptic cream or ointment is available, apply a small amount to the bite  site.  Wash and disinfect any instruments that you used to remove the tick. How should I dispose of a tick? To dispose of a live tick, use one of these methods:  Place it in rubbing alcohol.  Place it in a sealed bag or container.  Wrap it tightly in tape.  Flush it down the toilet. Contact a health care provider if:  You have symptoms of a disease after a tick bite. Symptoms of a tick-borne disease can occur from moments after the tick bites to 30 days after a tick is removed. Symptoms include: ? Fever or chills. ? Any of these signs in the bite area:  A red rash that makes a circle (bull's-eye rash) in the bite area.  Redness and swelling. ? Headache. ? Muscle, joint, or bone pain. ? Abnormal tiredness. ? Numbness in your legs or difficulty walking or moving your legs. ? Tender, swollen lymph glands.  A part of a tick breaks off and gets stuck in your skin. Get help right away if:  You are not able to remove a tick.  You experience muscle weakness or paralysis.  Your symptoms get worse or you experience new symptoms.  You find an engorged tick on your skin and you are in an area where disease from ticks is a high risk. Summary  Ticks may carry germs that can spread to a person through a bite and cause a disease.  Wear protective clothing and use insect repellent to prevent tick bites. Follow the instructions on the label.  If you find a tick on your body, remove it as soon as possible. If the tick is attached, do not try to remove with heat, alcohol, petroleum jelly, or fingernail polish.  Remove the attached tick using fine-tipped tweezers, curved forceps, or a tick-removal tool. Gently pull with steady, upward, even pressure until the tick lets go. Do not twist or jerk the tick. Do not squeeze or crush the tick's body.  If you have symptoms of a disease after being bitten by a tick, contact a health care provider. This information is not intended to replace advice  given to you by your health care provider. Make sure you discuss any questions you have with your health care provider. Document Revised: 10/26/2019 Document Reviewed: 10/26/2019 Elsevier Patient Education  2021 [employer].

## 2021-04-13 ENCOUNTER — Telehealth: Payer: Self-pay | Admitting: *Deleted

## 2021-04-13 ENCOUNTER — Encounter: Payer: Self-pay | Admitting: *Deleted

## 2021-04-13 DIAGNOSIS — J029 Acute pharyngitis, unspecified: Secondary | ICD-10-CM

## 2021-04-13 NOTE — Telephone Encounter (Signed)
Reviewed RN Haley note agreed with plan of care. 

## 2021-04-13 NOTE — Telephone Encounter (Signed)
Pt notified clinic and HR that neighbor he was around this weekend has tested positive for Covid. Spoke with pt by phone and he reports HA that started Tuesday 5/31 that he thought was a normal HA/start of a migraine for him. That worsened yesterday and this morning he woke with a sore throat.   Day 0 5/31 Day 5 6/5 Testing tomorrow 6/3. [facility] own [facility] test thru [facility] per [facility] preference.   ER precautions reviewed. He is wearing a mask when in common areas of home with girlfriend.

## 2021-04-14 NOTE — Telephone Encounter (Signed)
Patient contacted via telephone.  Stated went for covid test at 1130 today and results pending.  Sore throat, lump in throat and fatigue. Denied headache today.  Used nyquil and cherry lozenges.  Denied fever/chills/dyspnea.  Last night headache was bad and on right side.  Took tylenol and it stopped. Discussed may use dayquil/phenylephrine 10mg  po q6h prn rhinitis.  Flonase nasal spray 28mcg 1 spray each nostril BID prn rhinitis and continue tylenol 1000mg  po q6h prn pain/headache. A&Ox3 spoke full sentences without difficulty some nasal congestion no cough/throat clearing during 5 minute telephone call.  Patient stated out of covid home tests so ordered more from free [place] govt site today  Patient notified and had no further questions at this time.  Will contact patient tomorrow for covid test results.  HR notified test results pending and continue quarantine. Thank You! Your order has been placed and will be shipped in #2 packages.  Here are your order confirmation numbers:  Package 1 #: [id number]  Package 2 #: [id number]  If you provided your email address, we'll send you #2 confirmation emails, #2 emails with tracking numbers (#1 for each of your packages), and delivery updates.  Is this the first or second order for this address? If so, you can place another order.  Contact Information Haynes Dage  [email or web address]  Shipping Address Jonathan Lin  [address]  [place], [place] [address] [place]  Order Summary Free At-Home COVID-19 Tests Order of #8 tests  Subtotal $0.00 Shipping & Handling $0.00 Total $0.00

## 2021-04-16 NOTE — Telephone Encounter (Signed)
Patient returned call and reported covid test results negative.  Some post nasal drip today and throat very sore had to stop eating at times (drinking juice peach and food had paprika on it and pain was so bad he had to stop eating that food and just have yogurt)  Denied white spots on back of throat or trouble swallowing/breathing.  [place] govt covid tests have not arrived yet. Discussed with patient he sounds congested on phone today and throat clearing/nasal sniffing/congestion noted during telephone conversation duration 6 minutes.  Patient A&Ox3 spoke full sentences without difficulty.  Discussed post nasal drip can irritate throat and acidic foods on irritated throat tissue can be painful.  Patient reported not taking any medicine on a schedule and encouraged dayquil/nyquil/flonase per manufacturer instructions.  Check active ingredients on box to ensure not double dosing on tylenol/acetaminophen if included in his dayquil/nyquil tablets. Patient concerned about worsening sore throat and if taking nyquil and tylenol tonight doesn't help he is going to repeat covid home test tomorrow.  Reminded patient of medcost benefit can bill directly to medcost at [employer] or save receipt if purchased elsewhere and fill out claims form to [employer] for reimbursement.  Patient to use normal call out procedures if not feeling well tomorrow.  He stated he would call Tim and Jonathon in the morning if feeling bad.  Discussed mask wear at work this week for next 5 days and no eating in employee lunchroom.  Day 5 today 6/5  Day 6 RTW onsite cleared at this time unless throat pain not improving with plan of care/home covid test positive tomorrow.  Patient verbalized understanding information/instructions, agreed with plan of care and had no further questions at this time.  HR notified PCR covid test negative [facility] patient still symptomatic and if OTC medicine not helping symptoms overnight going to retest tomorrow am home covid test.   Cleared to return to work onsite if negative.  Stay home and notify clinic staff if positive.

## 2021-04-17 NOTE — Telephone Encounter (Signed)
Patient contacted via telephone spoke full sentences without difficulty A&Ox3 feeling a little better, ran some errands today eating without difficulty, OTC meds helping sore throat/congestion.  Feels well enough to return to work tomorrow with mask wear.  Discussed with patient I would notify HR cleared to return onsite with strict mask wear.  If new or worsening symptoms overnight he is to follow normal call out procedures.  No cough/throat clearing during 2 minute telephone call.  Patient verbalized understanding information/instructions, agreed with plan of care and had no further questions at this time.

## 2021-04-17 NOTE — Telephone Encounter (Signed)
Late entry Patient contacted via telephone reported throat not as sore as yesterday but body aches/congestion not feeling well.  Home covid test today negative.  Discussed with patient to monitor temperature and symptoms no need to repeat covid test later today at earliest tomorrow or Wednesday for repeat if symptoms worsening/new.  Patient to hydrate/rest/eat regular meals bland.  Continue plan of care as previously discussed.  Patient A&Ox3 spoke full sentences without difficulty; no cough audible during 3 minute telephone call.  Some nasal congestion/throat clearing.  He reported peach juice, coke, anything to eat hurting throat.  Denied white spots back of throat, fever, dyspnea, shortness of breath or trouble swallowing.  Discussed will call him back end of day to see if any new or worsening symptoms today.  Patient verbalized understanding information/instructions, agreed with plan of care and had no further questions at this time.

## 2021-04-19 NOTE — Telephone Encounter (Signed)
Patient contacted via telephone feeling much better symptoms resolved energy level back to normal.  Continue mask wear through 6/10 no further covid testing indicated.  HR notified. A&Ox3 spoke full sentences without difficulty; no cough/congestion/throat clearing noted during 3 minute telephone call. Patient verbalized understanding information/instructions, agreed with plan of care and no further questions at this time.

## 2021-04-26 NOTE — Telephone Encounter (Signed)
Patient contacted via telephone feeling well no questions or concerns and all symptoms resolved.  A&Ox3 spoke full sentences without difficulty no nasal congestion/cough or throat clearing during 2 minute telephone call.

## 2021-05-05 ENCOUNTER — Emergency Department (HOSPITAL_COMMUNITY)
Admission: EM | Admit: 2021-05-05 | Discharge: 2021-05-05 | Disposition: A | Discharge status: Home / Self Care | Payer: No Typology Code available for payment source | Attending: Emergency Medicine | Admitting: Emergency Medicine

## 2021-05-05 ENCOUNTER — Emergency Department (HOSPITAL_COMMUNITY): Payer: No Typology Code available for payment source

## 2021-05-05 ENCOUNTER — Other Ambulatory Visit: Payer: Self-pay

## 2021-05-05 ENCOUNTER — Encounter (HOSPITAL_COMMUNITY): Payer: Self-pay

## 2021-05-05 DIAGNOSIS — M545 Low back pain, unspecified: Secondary | ICD-10-CM | POA: Insufficient documentation

## 2021-05-05 DIAGNOSIS — R110 Nausea: Secondary | ICD-10-CM | POA: Diagnosis not present

## 2021-05-05 DIAGNOSIS — F17210 Nicotine dependence, cigarettes, uncomplicated: Secondary | ICD-10-CM | POA: Diagnosis not present

## 2021-05-05 DIAGNOSIS — M533 Sacrococcygeal disorders, not elsewhere classified: Secondary | ICD-10-CM | POA: Insufficient documentation

## 2021-05-05 DIAGNOSIS — R42 Dizziness and giddiness: Secondary | ICD-10-CM | POA: Insufficient documentation

## 2021-05-05 DIAGNOSIS — S0990XA Unspecified injury of head, initial encounter: Secondary | ICD-10-CM | POA: Insufficient documentation

## 2021-05-05 DIAGNOSIS — S80211A Abrasion, right knee, initial encounter: Secondary | ICD-10-CM | POA: Diagnosis not present
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
## 2021-05-05 NOTE — Discharge Instructions (Addendum)
I recommend a combination of tylenol and ibuprofen for management of your pain. You can take a low dose of both at the same time. I recommend 500 mg of Tylenol combined with 600 mg of ibuprofen. This is one maximum strength Tylenol and three regular ibuprofen. You can take these 2-3 times for day for your pain. Please try to take these medications with a small amount of food as well to prevent upsetting your stomach.  Also, please consider topical pain relieving creams such as Voltaran Gel, BioFreeze, or Icy Hot. There is also a pain relieving cream made by [employer]. You should be able to find all of these at your local pharmacy.   If you develop any new or worsening symptoms please do not hesitate to return to the emergency department. It was a pleasure to meet you.
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
## 2021-06-22 ENCOUNTER — Encounter: Payer: Self-pay | Admitting: *Deleted

## 2021-06-22 ENCOUNTER — Telehealth: Payer: Self-pay | Admitting: *Deleted

## 2021-06-22 DIAGNOSIS — Z77120 Contact with and (suspected) exposure to mold (toxic): Secondary | ICD-10-CM

## 2021-06-22 NOTE — Telephone Encounter (Addendum)
Pt in to clinic reporting sudden onset of clamminess/diaphoresis about 10 minutes PTA. Also with occipital HA, lightheadedness. Concern with climbing ladders. Had not noticed runny nose until in clinic and RN pointed it out. BP up today. 156/92, p90. 99%. Also endorsed stomach cramping but no n/v/d.  Pt sent home.   Day 0 8/11 Day 5 8/16 Testing Day 3 8/14 with home test. RTW 8/17 with strict mask use thru 8/21. F/u tomorrow 8/12.   Of note, pt did have smell of stale ETOH during exam. Did not note smell specific to breath but general body instead. Pt was clammy and warm to the touch. Seemed mildly agitated/restless (legs bouncing, hands fidgeting). Face flushed. Pt did not appear actively impaired. No slurred speech, slowed movements, swaying or stumbling, glassy eyes.
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
## 2021-06-23 NOTE — Telephone Encounter (Signed)
Spoke with pt by phone. He reports less clammy today but has had diarrhea today. One episode per pt. Denies n/v. No other new or worsening sx. Did go to bed early last night at 2000 which is atypical for him.  Will test Sunday 8/14 with home test and clinic will f/u after this.

## 2021-06-23 NOTE — Telephone Encounter (Signed)
Reviewed RN Hildred Alamin note will follow up with patient tomorrow via telephone.

## 2021-06-24 NOTE — Telephone Encounter (Signed)
Patient contacted via telephone stated he has been hydrating sweating, headache and lightheadedness have resolved.  Feeling much better.  Still has some mold in apt. Apt maintenance supposed to be coming Monday 06/26/21.  Patient to perform home covid test tomorrow and contact me with results.  Patient A&Ox3 spoke full sentences without difficulty no cough/congestion/throat clearing noted during 2 minute telephone call.  Patient verbalized understanding information/instructions, agreed with plan of care and no further questions at this time.  HR notified patient symptoms improving and covid test pending 06/25/21.

## 2021-06-25 NOTE — Telephone Encounter (Signed)
Patient contacted via telephone stated feeling well covid test negative today.  Denied fever/chills/congestion/cough/diarrhea.  Discussed may return to work tomorrow with strict mask wear through 07/02/21 and no eating in employee lunch room.  Drink beverages with straw under mask no removing mask indoors at work.  HR notified.  Patient A&Ox3 spoke full sentences without difficulty no cough/congestion/throat clearing audible during 2 minute telephone call.  Patient verbalized understanding information/instructions, agreed with plan of care and had no further questions at this time.

## 2021-06-26 NOTE — Telephone Encounter (Signed)
Reviewed RN Hildred Alamin note patient returned to work as expected today and strict mask use continues.

## 2021-06-26 NOTE — Telephone Encounter (Signed)
Called pt to confirm RTW today 8/15. He did RTW today as expected. Strict mask use thru 8/21.

## 2021-07-03 NOTE — Telephone Encounter (Signed)
Pt presents to clinic to respond to VM from NP. He sts he is symptom free and feels well after Day 10. Closing encounter.

## 2021-07-03 NOTE — Telephone Encounter (Signed)
Reviewed RN Hildred Alamin note patient feeling well no concerns encounter closed.

## 2021-07-25 ENCOUNTER — Encounter: Payer: Self-pay | Admitting: Registered Nurse

## 2021-07-25 ENCOUNTER — Telehealth: Payer: Self-pay | Admitting: Registered Nurse

## 2021-07-25 DIAGNOSIS — H538 Other visual disturbances: Secondary | ICD-10-CM

## 2021-07-25 NOTE — Telephone Encounter (Signed)
Patient came to clinic after his break/vaping outside noted blurred vision/tunnel vision and after came into building noted having trouble focusing vision.  Has been vaping instead of smoking trying to stop cigarettes/cut down.  Hasn't seen optometrist in the past year.  Was talking with HR when noticed trouble focusing vision inside and HR notified patient to go to clinic.  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  He has also noticed more floaters.  Vital signs obtained as patient walked into clinic sp02 99% room air, T98.80F otic, BP 124/82 HR 86 A&Ox3 spoke full sentences without difficulty respirations even and unlabored gait sure and steady bilateral extremity strength equal 5/5; in/out of chair without difficulty.  Snellen DVA ou 20/30; left 20/50 right 20/30  normal EOM/PERRL 4/10 bilaterally; no nystagmus; conjunctiva clear/lens clear/no discharge or debris in eyelashes/eyebrows; no photophobia. Skin warm dry and pink. Patient stated buys vaping solution from local stores.  Discussed with patient vaping not recommended as some individuals have required lung transplants.  Chemicals are not monitored/standardized and long term effects still not well known.  Discussed tunnel vision typically related to blood pressure dropping.  If curtain going over part of vision (not tunnel getting smaller from peripheral to central vision), central vision blurry/lost needs same day appt with optometry or if blow to face/eye and loss of vision ER (retinal detachment/eye injury suspected).  Discussed with aging typical to need reading glasses and if DVA worse than 20/40 DMV will required glasses for distance also.  Exitcare handouts to my chart on blurred vision and visual disturbances.  Patient verbalized understanding information/instructions, agreed with plan of care and had no further  questions at this time.

## 2021-07-27 ENCOUNTER — Ambulatory Visit: Payer: Self-pay | Admitting: Registered Nurse

## 2021-07-27 ENCOUNTER — Encounter: Payer: Self-pay | Admitting: Registered Nurse

## 2021-07-27 VITALS — BP 124/82 | HR 86 | Temp 98.9°F | Resp 16

## 2021-07-27 NOTE — Progress Notes (Signed)
Subjective:    Patient ID: Jonathan Lin, male    DOB: Mar 14, 1976, 45 y.o.   MRN: [id number]  45y/o caucasian male established patient follow up blurred vision 07/25/21.  Patient reported still vaping and having blurred vision but seems less of a problem today and denied worsening e.g. central vision/worsening acuity/photophobia/eye pain/discharge.  Has not contacted optometry office to schedule appt.  Denied headache.  Worked entire shift today without difficulty     Review of Systems  Constitutional:  Negative for activity change, appetite change, chills, diaphoresis, fatigue and fever.  HENT:  Negative for trouble swallowing and voice change.   Eyes:  Negative for pain, discharge, redness and itching.  Respiratory:  Negative for cough, choking, chest tightness and wheezing.   Cardiovascular:  Negative for chest pain.  Gastrointestinal:  Negative for diarrhea, nausea and vomiting.  Endocrine: Negative for cold intolerance.  Genitourinary:  Negative for difficulty urinating.  Musculoskeletal:  Negative for gait problem, neck pain and neck stiffness.  Skin:  Negative for rash.  Allergic/Immunologic: Positive for environmental allergies. Negative for food allergies.  Neurological:  Negative for dizziness, seizures, syncope, facial asymmetry, speech difficulty, light-headedness and headaches.  Hematological:  Negative for adenopathy. Does not bruise/bleed easily.  Psychiatric/Behavioral:  Negative for agitation, confusion and sleep disturbance.       Objective:   Physical Exam Vitals reviewed.  Constitutional:      General: He is awake. He is not in acute distress.    Appearance: Normal appearance. He is well-developed, well-groomed and normal weight. He is not ill-appearing, toxic-appearing or diaphoretic.  HENT:     Head: Normocephalic and atraumatic. No raccoon eyes, abrasion, right periorbital erythema or left periorbital erythema.     Jaw: There is normal jaw occlusion.      Salivary Glands: Right salivary gland is not diffusely enlarged. Left salivary gland is not diffusely enlarged.     Right Ear: Hearing and external ear normal.     Left Ear: Hearing and external ear normal.     Nose: Nose normal. No congestion or rhinorrhea.     Mouth/Throat:     Lips: Pink. No lesions.     Mouth: Mucous membranes are moist. No lacerations, oral lesions or angioedema.     Dentition: No gum lesions.     Tongue: No lesions. Tongue does not deviate from midline.     Palate: No mass and lesions.     Pharynx: Oropharynx is clear. No uvula swelling.  Eyes:     General: Lids are normal. Vision grossly intact. Gaze aligned appropriately. No allergic shiner, visual field deficit or scleral icterus.       Right eye: No foreign body, discharge or hordeolum.        Left eye: No foreign body, discharge or hordeolum.     Extraocular Movements: Extraocular movements intact.     Right eye: Normal extraocular motion and no nystagmus.     Left eye: Normal extraocular motion and no nystagmus.     Conjunctiva/sclera: Conjunctivae normal.     Right eye: Right conjunctiva is not injected. No chemosis, exudate or hemorrhage.    Left eye: Left conjunctiva is not injected. No chemosis, exudate or hemorrhage.    Pupils: Pupils are equal, round, and reactive to light.  Neck:     Trachea: Trachea and phonation normal. No tracheal deviation.  Cardiovascular:     Rate and Rhythm: Normal rate and regular rhythm.     Pulses: Normal pulses.  Radial pulses are 2+ on the right side and 2+ on the left side.     Heart sounds: Normal heart sounds, S1 normal and S2 normal. No murmur heard.   No friction rub. No gallop.  Pulmonary:     Effort: Pulmonary effort is normal. No respiratory distress.     Breath sounds: Normal breath sounds and air entry. No stridor or transmitted upper airway sounds. No decreased breath sounds or wheezing.     Comments: Spoke full sentences without difficulty; no cough  observed in exam room Abdominal:     General: Abdomen is flat.  Musculoskeletal:        General: No swelling, tenderness, deformity or signs of injury. Normal range of motion.     Right shoulder: No swelling, deformity, effusion or laceration. Normal range of motion.     Left shoulder: No swelling, deformity, effusion or laceration. Normal range of motion.     Right elbow: No swelling, deformity, effusion or lacerations. Normal range of motion.     Left elbow: No swelling, deformity, effusion or lacerations. Normal range of motion.     Right hand: No swelling, deformity or lacerations. Normal range of motion. Normal strength.     Left hand: No swelling, deformity or lacerations. Normal range of motion. Normal strength.     Cervical back: Normal range of motion and neck supple. No swelling, edema, deformity, erythema, signs of trauma, lacerations, rigidity, tenderness or crepitus. No pain with movement. Normal range of motion.     Thoracic back: No swelling, edema, deformity, signs of trauma or lacerations. Normal range of motion.     Lumbar back: No swelling, edema, deformity, signs of trauma or lacerations. Normal range of motion.     Right lower leg: No edema.     Left lower leg: No edema.  Lymphadenopathy:     Head:     Right side of head: No submandibular or preauricular adenopathy.     Left side of head: No submandibular or preauricular adenopathy.     Cervical: No cervical adenopathy.     Right cervical: No superficial cervical adenopathy.    Left cervical: No superficial cervical adenopathy.  Skin:    General: Skin is warm and dry.     Capillary Refill: Capillary refill takes less than 2 seconds.     Coloration: Skin is not ashen, cyanotic, jaundiced, mottled, pale or sallow.     Findings: No abrasion, abscess, acne, bruising, burn, ecchymosis, erythema, signs of injury, laceration, lesion, petechiae, rash or wound.     Nails: There is no clubbing.  Neurological:     General: No  focal deficit present.     Mental Status: He is alert and oriented to person, place, and time. Mental status is at baseline.     GCS: GCS eye subscore is 4. GCS verbal subscore is 5. GCS motor subscore is 6.     Cranial Nerves: Cranial nerves are intact. No cranial nerve deficit, dysarthria or facial asymmetry.     Sensory: Sensation is intact.     Motor: Motor function is intact. No weakness, tremor, atrophy, abnormal muscle tone or seizure activity.     Coordination: Coordination is intact. Coordination normal.     Gait: Gait is intact. Gait normal.     Comments: In/out of chair without difficulty; gait sure and steady in clinic; bilateral hand grasp equal 5/5  Psychiatric:        Attention and Perception: Attention and perception normal.  Mood and Affect: Mood and affect normal.        Speech: Speech normal.        Behavior: Behavior normal. Behavior is cooperative.        Thought Content: Thought content normal.        Cognition and Memory: Cognition and memory normal.        Judgment: Judgment normal.      Snellen os 20/50 OD 20/30 ou 20/30 uncorrected DVA    Assessment & Plan:   A-Blurred vision  P-patient reported no worsening in vision maybe slightly improved today from Tuesday 07/25/21.  Still has not contacted optometry to schedule appt.  Still vaping.  Encouraged patient to schedule optometry appt and stop vaping.  Follow up for re-evaluation prn new or worsening symptoms and urged same day ER if central vision loss/curtain over vision (not tunnel vision).  If having tunnel vision rest/hydrate and if vision returns to normal was related to low blood pressure. 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  Eye strain chronic can contribute to blurred vision.  Patient verbalized understanding  information/instructions, agreed with plan of care and had no further questions at this time.

## 2021-07-27 NOTE — Patient Instructions (Signed)
Blurred Vision, Adult   Having blurred vision means that you cannot see things clearly. Your vision may seem fuzzy or out of focus. It can involve your vision for objects that are close or far away. It may affect one or both eyes. There are many causes of blurred vision, including cataracts, macular degeneration, eye inflammation (uveitis), and diabetic retinopathy. In many cases, blurred vision has to do with the shape of your eye. An abnormal eye shape means you cannot focus well (refractive error). When this happens, it can cause: Faraway objects to look blurry (nearsightedness). Close objects to look blurry (farsightedness). Blurry vision at any distance (astigmatism). Refractive errors are often corrected with glasses or contacts. Blurred vision can be diagnosed based on your symptoms and a physical exam. Tell your health care provider about any other health problems you have, any recent eye injury, and any prior surgeries. You may need to see a health care provider who specializes in eye problems (ophthalmologist). Your treatment will depend on what is causing your blurred vision. Follow these instructions at home: Keep all follow-up visits as told by your health care provider. This is important. These include any visits to your eye specialists. Do not drive or use heavy machinery if your vision is blurry. Use eye drops only as told by your health care provider. If you were prescribed glasses or contact lenses, wear the glasses or contacts as told by your health care provider. Schedule eye exams regularly. Pay attention to any changes in your symptoms. Contact a health care provider if: Your symptoms do not improve or they get worse. You have: New symptoms. A headache. Trouble seeing at night. Trouble noticing the difference between colors. You notice: Drooping of your eyelids. Drainage coming from your eyes. A rash around your eyes. Get help right away if: You have: Severe eye  pain. A severe headache. A sudden change in vision. A sudden loss of vision. A vision change after an injury. You notice flashing lights in your field of vision. Your field of vision is the area that you can see without moving your eyes. Summary Having blurred vision means that you cannot see things clearly. Your vision may seem fuzzy or out of focus. There are many causes of blurred vision. In many cases, blurred vision has to do with an abnormal eye shape (refractive error), and it can be corrected with glasses or contact lenses. Pay attention to any changes in your symptoms. Contact a health care provider if your symptoms do not improve or if you have any new symptoms. This information is not intended to replace advice given to you by your health care provider. Make sure you discuss any questions you have with your health care provider. Document Revised: 07/01/2020 Document Reviewed: 07/01/2020 Elsevier Patient Education  [place].

## 2021-08-03 ENCOUNTER — Telehealth: Payer: Self-pay | Admitting: Registered Nurse

## 2021-08-03 ENCOUNTER — Encounter: Payer: Self-pay | Admitting: Registered Nurse

## 2021-08-03 DIAGNOSIS — R519 Headache, unspecified: Secondary | ICD-10-CM

## 2021-08-03 MED ORDER — ACETAMINOPHEN 500 MG PO TABS: 1000.0000 mg | ORAL_TABLET | Freq: Four times a day (QID) | ORAL | 0 refills | Status: AC | PRN
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
## 2021-12-12 ENCOUNTER — Other Ambulatory Visit (HOSPITAL_BASED_OUTPATIENT_CLINIC_OR_DEPARTMENT_OTHER): Payer: Self-pay

## 2021-12-12 MED ORDER — AMPHETAMINE-DEXTROAMPHETAMINE 30 MG PO TABS
ORAL_TABLET | Freq: Two times a day (BID) | ORAL | 0 refills | Status: DC
  Filled 2021-12-12: qty 60, 30d supply, fill #0

## 2022-01-11 ENCOUNTER — Other Ambulatory Visit (HOSPITAL_BASED_OUTPATIENT_CLINIC_OR_DEPARTMENT_OTHER): Payer: Self-pay

## 2022-01-11 MED ORDER — AMPHETAMINE-DEXTROAMPHETAMINE 30 MG PO TABS
ORAL_TABLET | Freq: Two times a day (BID) | ORAL | 0 refills | Status: DC
  Filled 2022-01-11: qty 60, 30d supply, fill #0

## 2022-02-08 ENCOUNTER — Other Ambulatory Visit (HOSPITAL_BASED_OUTPATIENT_CLINIC_OR_DEPARTMENT_OTHER): Payer: Self-pay

## 2022-02-08 MED ORDER — AMPHETAMINE-DEXTROAMPHETAMINE 30 MG PO TABS: 30.0000 mg | ORAL_TABLET | Freq: Two times a day (BID) | ORAL | 0 refills | Status: DC

## 2022-02-09 ENCOUNTER — Other Ambulatory Visit (HOSPITAL_BASED_OUTPATIENT_CLINIC_OR_DEPARTMENT_OTHER): Payer: Self-pay

## 2022-02-09 MED ORDER — AMPHETAMINE-DEXTROAMPHETAMINE 30 MG PO TABS
ORAL_TABLET | Freq: Two times a day (BID) | ORAL | 0 refills | Status: DC
  Filled 2022-02-09: qty 60, 30d supply, fill #0

## 2022-03-12 ENCOUNTER — Other Ambulatory Visit (HOSPITAL_BASED_OUTPATIENT_CLINIC_OR_DEPARTMENT_OTHER): Payer: Self-pay

## 2022-03-12 MED ORDER — AMPHETAMINE-DEXTROAMPHETAMINE 30 MG PO TABS
1.0000 | ORAL_TABLET | Freq: Two times a day (BID) | ORAL | 0 refills | Status: DC
  Filled 2022-03-12: qty 60, 30d supply, fill #0

## 2022-03-13 ENCOUNTER — Other Ambulatory Visit (HOSPITAL_BASED_OUTPATIENT_CLINIC_OR_DEPARTMENT_OTHER): Payer: Self-pay

## 2022-03-13 MED ORDER — AMPHETAMINE-DEXTROAMPHETAMINE 30 MG PO TABS: ORAL_TABLET | Freq: Two times a day (BID) | ORAL | 0 refills | Status: DC
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
## 2022-04-10 ENCOUNTER — Other Ambulatory Visit (HOSPITAL_BASED_OUTPATIENT_CLINIC_OR_DEPARTMENT_OTHER): Payer: Self-pay

## 2022-04-10 MED ORDER — AMPHETAMINE-DEXTROAMPHETAMINE 30 MG PO TABS
ORAL_TABLET | Freq: Two times a day (BID) | ORAL | 0 refills | Status: DC
  Filled 2022-04-11: qty 60, 30d supply, fill #0

## 2022-04-11 ENCOUNTER — Other Ambulatory Visit (HOSPITAL_BASED_OUTPATIENT_CLINIC_OR_DEPARTMENT_OTHER): Payer: Self-pay

## 2022-05-10 ENCOUNTER — Other Ambulatory Visit (HOSPITAL_BASED_OUTPATIENT_CLINIC_OR_DEPARTMENT_OTHER): Payer: Self-pay

## 2022-05-10 MED ORDER — AMPHETAMINE-DEXTROAMPHETAMINE 30 MG PO TABS
ORAL_TABLET | Freq: Two times a day (BID) | ORAL | 0 refills | Status: DC
  Filled 2022-05-11: qty 60, 30d supply, fill #0

## 2022-05-11 ENCOUNTER — Other Ambulatory Visit (HOSPITAL_BASED_OUTPATIENT_CLINIC_OR_DEPARTMENT_OTHER): Payer: Self-pay

## 2022-06-04 ENCOUNTER — Encounter (HOSPITAL_COMMUNITY): Payer: Self-pay

## 2022-06-04 ENCOUNTER — Emergency Department (HOSPITAL_COMMUNITY)
Admission: EM | Admit: 2022-06-04 | Discharge: 2022-06-04 | Disposition: A | Discharge status: Home / Self Care | Payer: No Typology Code available for payment source | Attending: Emergency Medicine | Admitting: Emergency Medicine

## 2022-06-04 ENCOUNTER — Other Ambulatory Visit: Payer: Self-pay

## 2022-06-04 ENCOUNTER — Emergency Department (HOSPITAL_COMMUNITY): Payer: No Typology Code available for payment source

## 2022-06-04 DIAGNOSIS — R1030 Lower abdominal pain, unspecified: Secondary | ICD-10-CM | POA: Insufficient documentation

## 2022-06-04 DIAGNOSIS — K760 Fatty (change of) liver, not elsewhere classified: Secondary | ICD-10-CM | POA: Insufficient documentation

## 2022-06-04 LAB — URINALYSIS, ROUTINE W REFLEX MICROSCOPIC
Bilirubin Urine: NEGATIVE
Glucose, UA: NEGATIVE mg/dL
Hgb urine dipstick: NEGATIVE
Ketones, ur: 20 mg/dL — AB
Leukocytes,Ua: NEGATIVE
Nitrite: NEGATIVE
Protein, ur: 30 mg/dL — AB
Specific Gravity, Urine: 1.018 (ref 1.005–1.030)
pH: 6 (ref 5.0–8.0)

## 2022-06-04 LAB — COMPREHENSIVE METABOLIC PANEL
ALT: 38 U/L (ref 0–44)
AST: 23 U/L (ref 15–41)
Albumin: 4 g/dL (ref 3.5–5.0)
Alkaline Phosphatase: 37 U/L — ABNORMAL LOW (ref 38–126)
Anion gap: 8 (ref 5–15)
BUN: 15 mg/dL (ref 6–20)
CO2: 24 mmol/L (ref 22–32)
Calcium: 9.8 mg/dL (ref 8.9–10.3)
Chloride: 105 mmol/L (ref 98–111)
Creatinine, Ser: 0.67 mg/dL (ref 0.61–1.24)
GFR, Estimated: >60 mL/min (ref >60)
Glucose, Bld: 101 mg/dL — ABNORMAL HIGH (ref 70–99)
Potassium: 3.7 mmol/L (ref 3.5–5.1)
Sodium: 137 mmol/L (ref 135–145)
Total Bilirubin: 0.9 mg/dL (ref 0.3–1.2)
Total Protein: 7.4 g/dL (ref 6.5–8.1)

## 2022-06-04 LAB — CBC
HCT: 42.3 % (ref 39.0–52.0)
Hemoglobin: 14.3 g/dL (ref 13.0–17.0)
MCH: 31.6 pg (ref 26.0–34.0)
MCHC: 33.8 g/dL (ref 30.0–36.0)
MCV: 93.6 fL (ref 80.0–100.0)
Platelets: 198 10*3/uL (ref 150–400)
RBC: 4.52 MIL/uL (ref 4.22–5.81)
RDW: 12.5 % (ref 11.5–15.5)
WBC: 7.6 10*3/uL (ref 4.0–10.5)
nRBC: 0 % (ref 0.0–0.2)

## 2022-06-04 LAB — LIPASE, BLOOD: Lipase: 41 U/L (ref 11–51)

## 2022-06-04 NOTE — ED Provider Triage Note (Signed)
Emergency Medicine Provider Triage Evaluation Note  Amr Sturtevant , a 46 y.o. male  was evaluated in triage.  Pt complains of bilateral pelvic pain that began today after urination.  Patient states that over the last week, he has increased urinary frequency without a lot of urine production.  He states that he "feels like he needs to pee but only a little bit comes out".  He does have history of kidney stones.  Pain is worse on the bilateral pelvis.  He denies chest pain, shortness of breath, fevers, nausea, vomiting and diarrhea.  Review of Systems  Positive:  Negative:   Physical Exam  BP (!) 144/109 (BP Location: Left Arm)   Pulse 81   Temp 98.3 F (36.8 C) (Oral)   Resp 18   SpO2 98%  Gen:   Awake, no distress   Resp:  Normal effort  MSK:   Moves extremities without difficulty  Other:  Abdomen soft, nondistended.  Tenderness to the left lateral and right lateral lower quadrants.  Medical Decision Making  Medically screening exam initiated at 3:54 PM.  Appropriate orders placed.  Taliesin Hartlage was informed that the remainder of the evaluation will be completed by another provider, this initial triage assessment does not replace that evaluation, and the importance of remaining in the ED until their evaluation is complete.     Tonye Pearson, [place] 06/04/22 1555

## 2022-06-04 NOTE — ED Provider Notes (Signed)
[place] DEPT Provider Note   CSN: [id number] Arrival date & time: 06/04/22  1516     History  Chief Complaint  Patient presents with   Abdominal Pain    Jonathan Lin is a 46 y.o. male.  Pt is a 46 yo male with a pmhx significant for depression, kidney stones, bipolar d/o, and polysubstance abuse (clean for several months).  Pt said he's had lower abd pain since urinating today.  He said he feels like he needs to urinate, but a lot does not come out.  He is worried he may have a kidney stone.  Pt denies any f/c.  No n/v.         Home Medications Prior to Admission medications   Medication Sig Start Date End Date Taking? Authorizing Provider  amphetamine-dextroamphetamine (ADDERALL) 30 MG tablet Take 1 tablet by mouth 2 (two) times daily. 02/02/20   [provider]  amphetamine-dextroamphetamine (ADDERALL) 30 MG tablet Take 1 tablet by mouth twice a day 01/11/22     amphetamine-dextroamphetamine (ADDERALL) 30 MG tablet Take 1 tablet by mouth twice a day 02/09/22     amphetamine-dextroamphetamine (ADDERALL) 30 MG tablet Take 1 tablet by mouth twice a day 03/12/22     amphetamine-dextroamphetamine (ADDERALL) 30 MG tablet Take 1 tablet by mouth twice a day 04/11/22     amphetamine-dextroamphetamine (ADDERALL) 30 MG tablet Take 1 tablet by mouth 2 times daily 05/11/22     calcium carbonate (TUMS - DOSED IN MG ELEMENTAL CALCIUM) 500 MG chewable tablet Chew 1 tablet by mouth 3 (three) times daily as needed for indigestion or heartburn.    [provider]  zinc gluconate 50 MG tablet Take 100 mg by mouth daily. Patient not taking: Reported on 03/21/2021    [provider]      Allergies    Codeine    Review of Systems   Review of Systems  Gastrointestinal:  Positive for abdominal pain.  All other systems reviewed and are negative.   Physical Exam Updated Vital Signs BP (!) 146/108   Pulse 70   Temp 98 F (36.7 C) (Oral)    Resp 19   SpO2 99%  Physical Exam Vitals and nursing note reviewed.  Constitutional:      Appearance: He is well-developed.  HENT:     Head: Normocephalic and atraumatic.     Mouth/Throat:     Mouth: Mucous membranes are moist.     Pharynx: Oropharynx is clear.  Eyes:     Extraocular Movements: Extraocular movements intact.     Pupils: Pupils are equal, round, and reactive to light.  Cardiovascular:     Rate and Rhythm: Normal rate and regular rhythm.     Heart sounds: Normal heart sounds.  Pulmonary:     Effort: Pulmonary effort is normal.     Breath sounds: Normal breath sounds.  Abdominal:     General: Abdomen is flat. Bowel sounds are normal.     Palpations: Abdomen is soft.     Tenderness: There is abdominal tenderness in the suprapubic area.  Skin:    General: Skin is warm.     Capillary Refill: Capillary refill takes less than 2 seconds.  Neurological:     General: No focal deficit present.     Mental Status: He is alert and oriented to person, place, and time.  Psychiatric:        Mood and Affect: Mood normal.        Behavior:  Behavior normal.     ED Results / Procedures / Treatments   Labs (all labs ordered are listed, but only abnormal results are displayed) Labs Reviewed  COMPREHENSIVE METABOLIC PANEL - Abnormal; Notable for the following components:      Result Value   Glucose, Bld 101 (*)    Alkaline Phosphatase 37 (*)    All other components within normal limits  URINALYSIS, ROUTINE W REFLEX MICROSCOPIC - Abnormal; Notable for the following components:   APPearance CLOUDY (*)    Ketones, ur 20 (*)    Protein, ur 30 (*)    Bacteria, UA RARE (*)    All other components within normal limits  LIPASE, BLOOD  CBC    EKG None  Radiology CT Renal Stone Study  Result Date: 06/04/2022 CLINICAL DATA:  Flank pain, kidney stone suspected. EXAM: CT ABDOMEN AND PELVIS WITHOUT CONTRAST TECHNIQUE: Multidetector CT imaging of the abdomen and pelvis was  performed following the standard protocol without IV contrast. RADIATION DOSE REDUCTION: This exam was performed according to the departmental dose-optimization program which includes automated exposure control, adjustment of the mA and/or kV according to patient size and/or use of iterative reconstruction technique. COMPARISON:  CT examination dated August 09, 2013 FINDINGS: Lower chest: No acute abnormality. Hepatobiliary: Low attenuation of hepatic parenchyma concerning for hepatic steatosis. No focal liver abnormality is seen. No gallstones, gallbladder wall thickening, or biliary dilatation. Pancreas: Unremarkable. No pancreatic ductal dilatation or surrounding inflammatory changes. Spleen: Normal in size without focal abnormality. Adrenals/Urinary Tract: Adrenal glands are unremarkable. There is no nephrolithiasis or hydronephrosis. No ureteral calculus. Left pelvic phleboliths were noted. Bladder is unremarkable. Stomach/Bowel: Stomach is within normal limits. Appendix appears normal. No evidence of bowel wall thickening, distention, or inflammatory changes. Vascular/Lymphatic: No significant vascular findings are present. No enlarged abdominal or pelvic lymph nodes. Reproductive: Prostate is unremarkable. Other: No abdominal wall hernia or abnormality. No abdominopelvic ascites. Musculoskeletal: Mild multilevel degenerate disc disease of the lumbar spine. IMPRESSION: 1. No evidence of nephrolithiasis or hydronephrosis. No ureteral calculus or evidence of recently passed calculus. 2.  Normal appendix.  No evidence of colitis or diverticulitis. 3.  Hepatic steatosis. 4.  No CT evidence of acute abdominal/pelvic process. Electronically Signed   By: Keane Police D.O.   On: 06/04/2022 17:16    Procedures Procedures    Medications Ordered in ED Medications - No data to display  ED Course/ Medical Decision Making/ A&P                           Medical Decision Making Amount and/or Complexity of Data  Reviewed Labs: ordered.   This patient presents to the ED for concern of abd pain, this involves an extensive number of treatment options, and is a complaint that carries with it a high risk of complications and morbidity.  The differential diagnosis includes kidney stone, diverticulitis, appendicitis, uti   Co morbidities that complicate the patient evaluation  depression, kidney stones, bipolar d/o, and polysubstance abuse (clean for several months)   Additional history obtained:  Additional history obtained from epic chart review  Lab Tests:  I Ordered, and personally interpreted labs.  The pertinent results include:  cbc nl, cmp nl, ua nl other than some ketones.   Imaging Studies ordered:  I ordered imaging studies including ct renal  I independently visualized and interpreted imaging which showed  IMPRESSION:  1. No evidence of nephrolithiasis or hydronephrosis. No ureteral  calculus  or evidence of recently passed calculus.    2.  Normal appendix.  No evidence of colitis or diverticulitis.    3.  Hepatic steatosis.    4.  No CT evidence of acute abdominal/pelvic process.   I agree with the radiologist interpretation   Cardiac Monitoring:  The patient was maintained on a cardiac monitor.  I personally viewed and interpreted the cardiac monitored which showed an underlying rhythm of: nsr   Medicines ordered and prescription drug management:  II have reviewed the patients home medicines and have made adjustments as needed Pt did not want any meds for pain   Test Considered:  Testicular US, but pt refused   Critical Interventions:  ct  Problem List / ED Course:  Abd pain:  labs and ct reassuring.  He feels well now and wants to leave.  He did not want to get a testicular US.  He knows to return if sx worsen.   Reevaluation:  After the interventions noted above, I reevaluated the patient and found that they have :improved   Social Determinants of  Health:  Lives at home   Dispostion:  After consideration of the diagnostic results and the patients response to treatment, I feel that the patent would benefit from discharge with outpatient f/u.          Final Clinical Impression(s) / ED Diagnoses Final diagnoses:  Lower abdominal pain    Rx / DC Orders ED Discharge Orders     None         Isla Pence, MD 06/04/22 2208

## 2022-06-04 NOTE — ED Triage Notes (Signed)
Pt reports lower pelvic/abdominal pain that began today shortly after urinating. Pt reports the pain is now constant, but gets worse at times. Pt also endorses urinary frequency that began last week.

## 2022-06-11 ENCOUNTER — Other Ambulatory Visit (HOSPITAL_BASED_OUTPATIENT_CLINIC_OR_DEPARTMENT_OTHER): Payer: Self-pay

## 2022-06-11 MED ORDER — AMPHETAMINE-DEXTROAMPHETAMINE 30 MG PO TABS
ORAL_TABLET | Freq: Two times a day (BID) | ORAL | 0 refills | Status: DC
  Filled 2022-06-11: qty 60, 30d supply, fill #0

## 2022-07-10 ENCOUNTER — Other Ambulatory Visit (HOSPITAL_BASED_OUTPATIENT_CLINIC_OR_DEPARTMENT_OTHER): Payer: Self-pay

## 2022-07-10 MED ORDER — AMPHETAMINE-DEXTROAMPHETAMINE 30 MG PO TABS
ORAL_TABLET | Freq: Two times a day (BID) | ORAL | 0 refills | Status: DC
  Filled 2022-07-11: qty 60, 30d supply, fill #0

## 2022-07-11 ENCOUNTER — Other Ambulatory Visit (HOSPITAL_BASED_OUTPATIENT_CLINIC_OR_DEPARTMENT_OTHER): Payer: Self-pay

## 2022-08-07 ENCOUNTER — Other Ambulatory Visit (HOSPITAL_BASED_OUTPATIENT_CLINIC_OR_DEPARTMENT_OTHER): Payer: Self-pay

## 2022-08-07 MED ORDER — AMPHETAMINE-DEXTROAMPHETAMINE 30 MG PO TABS
30.0000 mg | ORAL_TABLET | Freq: Two times a day (BID) | ORAL | 0 refills | Status: DC
  Filled 2022-08-10: qty 60, 30d supply, fill #0

## 2022-08-10 ENCOUNTER — Other Ambulatory Visit (HOSPITAL_BASED_OUTPATIENT_CLINIC_OR_DEPARTMENT_OTHER): Payer: Self-pay

## 2022-09-10 ENCOUNTER — Other Ambulatory Visit (HOSPITAL_BASED_OUTPATIENT_CLINIC_OR_DEPARTMENT_OTHER): Payer: Self-pay

## 2022-09-10 MED ORDER — AMPHETAMINE-DEXTROAMPHETAMINE 30 MG PO TABS
30.0000 mg | ORAL_TABLET | Freq: Two times a day (BID) | ORAL | 0 refills | Status: DC
  Filled 2022-09-10: qty 60, 30d supply, fill #0

## 2022-10-09 ENCOUNTER — Other Ambulatory Visit (HOSPITAL_BASED_OUTPATIENT_CLINIC_OR_DEPARTMENT_OTHER): Payer: Self-pay

## 2022-10-09 MED ORDER — AMPHETAMINE-DEXTROAMPHETAMINE 30 MG PO TABS
30.0000 mg | ORAL_TABLET | Freq: Two times a day (BID) | ORAL | 0 refills | Status: DC
  Filled 2022-10-10: qty 60, 30d supply, fill #0

## 2022-10-10 ENCOUNTER — Other Ambulatory Visit (HOSPITAL_BASED_OUTPATIENT_CLINIC_OR_DEPARTMENT_OTHER): Payer: Self-pay

## 2022-11-07 ENCOUNTER — Other Ambulatory Visit (HOSPITAL_BASED_OUTPATIENT_CLINIC_OR_DEPARTMENT_OTHER): Payer: Self-pay

## 2022-11-07 MED ORDER — AMPHETAMINE-DEXTROAMPHETAMINE 30 MG PO TABS
30.0000 mg | ORAL_TABLET | Freq: Two times a day (BID) | ORAL | 0 refills | Status: DC
  Filled 2022-11-09: qty 60, 30d supply, fill #0

## 2022-11-09 ENCOUNTER — Other Ambulatory Visit (HOSPITAL_BASED_OUTPATIENT_CLINIC_OR_DEPARTMENT_OTHER): Payer: Self-pay

## 2022-12-06 ENCOUNTER — Other Ambulatory Visit (HOSPITAL_BASED_OUTPATIENT_CLINIC_OR_DEPARTMENT_OTHER): Payer: Self-pay

## 2022-12-06 MED ORDER — AMPHETAMINE-DEXTROAMPHETAMINE 30 MG PO TABS
30.0000 mg | ORAL_TABLET | Freq: Two times a day (BID) | ORAL | 0 refills | Status: DC
  Filled 2022-12-07: qty 60, 30d supply, fill #0

## 2022-12-07 ENCOUNTER — Other Ambulatory Visit (HOSPITAL_BASED_OUTPATIENT_CLINIC_OR_DEPARTMENT_OTHER): Payer: Self-pay

## 2023-01-07 ENCOUNTER — Other Ambulatory Visit (HOSPITAL_BASED_OUTPATIENT_CLINIC_OR_DEPARTMENT_OTHER): Payer: Self-pay

## 2023-01-07 MED ORDER — AMPHETAMINE-DEXTROAMPHETAMINE 30 MG PO TABS
1.0000 | ORAL_TABLET | Freq: Two times a day (BID) | ORAL | 0 refills | Status: DC
  Filled 2023-01-08 (×2): qty 60, 30d supply, fill #0

## 2023-01-08 ENCOUNTER — Other Ambulatory Visit (HOSPITAL_BASED_OUTPATIENT_CLINIC_OR_DEPARTMENT_OTHER): Payer: Self-pay

## 2023-01-08 ENCOUNTER — Other Ambulatory Visit: Payer: Self-pay

## 2023-02-07 ENCOUNTER — Other Ambulatory Visit (HOSPITAL_BASED_OUTPATIENT_CLINIC_OR_DEPARTMENT_OTHER): Payer: Self-pay

## 2023-02-07 MED ORDER — AMPHETAMINE-DEXTROAMPHETAMINE 30 MG PO TABS
30.0000 mg | ORAL_TABLET | Freq: Two times a day (BID) | ORAL | 0 refills | Status: DC
  Filled 2023-02-07: qty 60, 30d supply, fill #0

## 2023-02-08 ENCOUNTER — Other Ambulatory Visit: Payer: Self-pay

## 2023-02-09 ENCOUNTER — Encounter (HOSPITAL_COMMUNITY): Payer: Self-pay

## 2023-02-09 ENCOUNTER — Emergency Department (HOSPITAL_COMMUNITY): Payer: BC Managed Care – PPO

## 2023-02-09 ENCOUNTER — Emergency Department (HOSPITAL_COMMUNITY)
Admission: EM | Admit: 2023-02-09 | Discharge: 2023-02-09 | Disposition: A | Discharge status: Home / Self Care | Payer: BC Managed Care – PPO | Attending: Emergency Medicine | Admitting: Emergency Medicine

## 2023-02-09 DIAGNOSIS — Y990 Civilian activity done for income or pay: Secondary | ICD-10-CM | POA: Insufficient documentation

## 2023-02-09 DIAGNOSIS — S4992XA Unspecified injury of left shoulder and upper arm, initial encounter: Secondary | ICD-10-CM | POA: Diagnosis present

## 2023-02-09 DIAGNOSIS — S46912A Strain of unspecified muscle, fascia and tendon at shoulder and upper arm level, left arm, initial encounter: Secondary | ICD-10-CM | POA: Insufficient documentation

## 2023-02-09 DIAGNOSIS — X500XXA Overexertion from strenuous movement or load, initial encounter: Secondary | ICD-10-CM | POA: Diagnosis not present

## 2023-02-09 DIAGNOSIS — M25512 Pain in left shoulder: Secondary | ICD-10-CM

## 2023-02-09 MED ORDER — IBUPROFEN 600 MG PO TABS: 600.0000 mg | ORAL_TABLET | Freq: Three times a day (TID) | ORAL | 0 refills | Status: AC | PRN

## 2023-02-09 NOTE — ED Triage Notes (Signed)
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 Pt states it has been "popping"

## 2023-02-09 NOTE — ED Provider Notes (Signed)
[name]   CSN: [id number] Arrival date & time: 02/09/23  1037     History  Chief Complaint  Patient presents with   Shoulder Pain    Jonathan Lin is a 47 y.o. male who presents to the ED complaining of left shoulder pain.  Patient states that he has had left shoulder pain for about the last month but then 4 days ago he was lifting while at work and suddenly had an increase in pain in his shoulder as well as paresthesias down his left arm.  He notes that he has improvement in his pain when flexing his arm at the elbow and holding it against the chest.  He denies any fall or direct trauma to the shoulder.  He denies a previous history of issues with his shoulder or neck.  He has been taking over-the-counter medications at home without relief.  He denies back pain, fever, chills, chest pain, or other complaints today.     Home Medications No current prescription medication  Allergies    Codeine    Review of Systems   Review of Systems  All other systems reviewed and are negative.   Physical Exam Updated Vital Signs BP (!) 151/92 (BP Location: Right Arm)   Pulse (!) 110   Temp 98.4 F (36.9 C) (Oral)   Resp 19   Ht 5\' 11"  (1.803 m)   Wt 86.2 kg   SpO2 98%   BMI 26.50 kg/m  Physical Exam Vitals and nursing note reviewed.  Constitutional:      General: He is not in acute distress.    Appearance: Normal appearance. He is not toxic-appearing.  HENT:     Head: Normocephalic and atraumatic.     Mouth/Throat:     Mouth: Mucous membranes are moist.  Eyes:     Conjunctiva/sclera: Conjunctivae normal.  Neck:     Comments: No midline tenderness, step-offs, or deformities, no overlying skin changes Cardiovascular:     Rate and Rhythm: Normal rate and regular rhythm.     Heart sounds: No murmur heard. Pulmonary:     Effort: Pulmonary effort is normal.     Breath sounds: Normal breath sounds.  Abdominal:      General: Abdomen is flat.     Palpations: Abdomen is soft.  Musculoskeletal:     Cervical back: Normal range of motion and neck supple. No rigidity.     Comments: No overt tenderness over any aspect of the left shoulder, arm, or neck but patient is holding left arm in a guarded position bent at the elbow close to the chest, positive supraspinatus test, negative drop arm test, 2+ radial pulse, sensation intact, 5/5 strength to bilateral upper extremities, no overlying skin changes, no deformity, joints stable  Skin:    General: Skin is warm and dry.     Capillary Refill: Capillary refill takes less than 2 seconds.  Neurological:     Mental Status: He is alert. Mental status is at baseline.  Psychiatric:        Behavior: Behavior normal.     ED Results / Procedures / Treatments   Labs (all labs ordered are listed, but only abnormal results are displayed) Labs Reviewed - No data to display  EKG None  Radiology CT Cervical Spine Wo Contrast  Result Date: 02/09/2023 CLINICAL DATA:  Trauma EXAM: CT CERVICAL SPINE WITHOUT CONTRAST TECHNIQUE: Multidetector CT imaging of the cervical spine was performed without intravenous contrast.  Multiplanar CT image reconstructions were also generated. RADIATION DOSE REDUCTION: This exam was performed according to the departmental dose-optimization program which includes automated exposure control, adjustment of the mA and/or kV according to patient size and/or use of iterative reconstruction technique. COMPARISON:  Cervical spine radiographs 05/08/2010 FINDINGS: Alignment: Normal. Skull base and vertebrae: No acute fracture. No primary bone lesion or focal pathologic process. There is a small lucent lesion in the C2 spinous process (series 5, image 100) which was likely present on prior cervical spine radiograph dated 05/08/2010 and favored to be benign. Soft tissues and spinal canal: No prevertebral fluid or swelling. No visible canal hematoma. Disc levels:   No evidence of high-grade spinal canal stenosis. Upper chest: Negative. Other: Large dental caries along the bilateral maxillary and mandibular molars. IMPRESSION: 1. No acute fracture or traumatic malalignment of the cervical spine. 2. Large dental caries along the bilateral maxillary and mandibular molars. Electronically Signed   By: Marin Roberts M.D.   On: 02/09/2023 11:47   DG Shoulder Left  Result Date: 02/09/2023 CLINICAL DATA:  Pain, injured at work EXAM: LEFT SHOULDER - 2+ VIEW COMPARISON:  None Available. FINDINGS: There is no evidence of fracture or dislocation. There is no evidence of arthropathy or other focal bone abnormality. Soft tissues are unremarkable. IMPRESSION: No fracture or dislocation of the left shoulder. Joint spaces are preserved. Electronically Signed   By: Delanna Ahmadi M.D.   On: 02/09/2023 11:32    Procedures Procedures  Pt placed in shoulder sling with concern for rotator cuff injury  Medications Ordered in ED Medications - No data to display  ED Course/ Medical Decision Making/ A&P                             Medical Decision Making Amount and/or Complexity of Data Reviewed Radiology: ordered. Decision-making details documented in ED Course.   Medical Decision Making:   Jonathan Lin is a 47 y.o. male who presented to the ED today with left shoulder pain detailed above.    Patient's presentation is complicated by their history of possible injury.  Complete initial physical exam performed, notably the patient  was in no acute distress.  He had no obvious tenderness over the left shoulder, neck, or arm.  Grip strength was intact.  Normal sensation on exam.  No obvious deformity, no overlying skin changes, stable joints and soft compartments.   Range of motion of shoulder decreased with pt holding arm in guarded position against chest. Positive supraspinatus test.   Reviewed and confirmed nursing documentation for past medical history, family history, social  history.    Initial Assessment:   With the patient's presentation of shoulder pain, differential diagnosis includes but is not limited to fracture, dislocation, cervical radiculopathy, disk herniation, rotator cuff tear, septic joint, impingement syndrome, strain, sprain.  This is most consistent with an acute complicated illness  Initial Plan:  Left shoulder x-ray for evaluation of bony pathology CT cervical spine for evaluation of cervical pathology Pain medication offered but patient declines Objective evaluation as reviewed   Initial Study Results:   Radiology:  All images reviewed independently. Agree with radiology report at this time.   CT Cervical Spine Wo Contrast  Result Date: 02/09/2023 CLINICAL DATA:  Trauma EXAM: CT CERVICAL SPINE WITHOUT CONTRAST TECHNIQUE: Multidetector CT imaging of the cervical spine was performed without intravenous contrast. Multiplanar CT image reconstructions were also generated. RADIATION DOSE REDUCTION: This exam was performed  according to the departmental dose-optimization program which includes automated exposure control, adjustment of the mA and/or kV according to patient size and/or use of iterative reconstruction technique. COMPARISON:  Cervical spine radiographs 05/08/2010 FINDINGS: Alignment: Normal. Skull base and vertebrae: No acute fracture. No primary bone lesion or focal pathologic process. There is a small lucent lesion in the C2 spinous process (series 5, image 100) which was likely present on prior cervical spine radiograph dated 05/08/2010 and favored to be benign. Soft tissues and spinal canal: No prevertebral fluid or swelling. No visible canal hematoma. Disc levels:  No evidence of high-grade spinal canal stenosis. Upper chest: Negative. Other: Large dental caries along the bilateral maxillary and mandibular molars. IMPRESSION: 1. No acute fracture or traumatic malalignment of the cervical spine. 2. Large dental caries along the bilateral  maxillary and mandibular molars. Electronically Signed   By: Marin Roberts M.D.   On: 02/09/2023 11:47   DG Shoulder Left  Result Date: 02/09/2023 CLINICAL DATA:  Pain, injured at work EXAM: LEFT SHOULDER - 2+ VIEW COMPARISON:  None Available. FINDINGS: There is no evidence of fracture or dislocation. There is no evidence of arthropathy or other focal bone abnormality. Soft tissues are unremarkable. IMPRESSION: No fracture or dislocation of the left shoulder. Joint spaces are preserved. Electronically Signed   By: Delanna Ahmadi M.D.   On: 02/09/2023 11:32     Final Assessment and Plan:   This is a 47 year old male who presents to the ED with left shoulder pain.  He has had this pain for over a month but notes questionable injury while lifting at work this past week with worsening of his pain and paresthesias to the left arm.  On exam, patient does seem to have a positive supraspinatus test but negative drop arm test.  He is holding the arm in a guarded position against objects.  Grip strength is intact.  Normal sensation on exam.  No overt cervical spinal tenderness, step-offs, or deformities but with patient expressing focal paresthesias CT cervical spine obtained as above for further assessment to rule out acute trauma to the neck, disc herniation.  CT cervical spine and x-ray of the left shoulder negative for acute pathology.  Do have some concern for rotator cuff tear with physical exam findings as above.  Will place patient in shoulder sling and have him follow-up with orthopedics.  NSAIDs for home for pain relief as patient reports he does not want to take opiate pain medication.  Patient given instructions for outpatient follow-up.  Strict ED return precautions given, all questions answered, patient stable for discharge.   Clinical Impression:  1. Acute pain of left shoulder   2. Strain of left shoulder, initial encounter      Data Unavailable           Final Clinical Impression(s) /  ED Diagnoses Final diagnoses:  Acute pain of left shoulder  Strain of left shoulder, initial encounter    Rx / DC Orders ED Discharge Orders          Ordered    ibuprofen (ADVIL) 600 MG tablet  Every 8 hours PRN        02/09/23 1207              Suzzette Righter, PA-C 02/09/23 1207    Regan Lemming, MD 02/10/23 1807

## 2023-02-09 NOTE — Discharge Instructions (Signed)
Thank you for letting us take care of you today.  The CT of your neck and the x-ray of your left shoulder did not show any acute findings.  However, as I discussed with you that does not rule out all injuries to the shoulder and with her physical exam I do have some concern for rotator cuff tear.  With this, we will place you in a shoulder sling and I recommend that you use this at home until you have significant improvement in symptoms.  He did not have significant improvement over the next few days, you should follow-up in the office with orthopedics.  I provided their follow-up information above.  It is important to keep this follow-up as using shoulder sling for too long can lead to a frozen joint or worsening of your pain and stiffness.  Please take the NSAIDs as prescribed to help with pain and inflammation.  For any new injury or worsening symptoms such as severe pain, chest pain, weakness in your arm, or other new, concerning symptoms, please return to the nearest emergency department for reevaluation.

## 2023-03-15 ENCOUNTER — Other Ambulatory Visit (HOSPITAL_BASED_OUTPATIENT_CLINIC_OR_DEPARTMENT_OTHER): Payer: Self-pay

## 2023-03-15 MED ORDER — AMPHETAMINE-DEXTROAMPHETAMINE 30 MG PO TABS
30.0000 mg | ORAL_TABLET | Freq: Two times a day (BID) | ORAL | 0 refills | Status: DC
  Filled 2023-03-15: qty 54, 27d supply, fill #0
  Filled 2023-03-15: qty 6, 3d supply, fill #0

## 2023-04-11 ENCOUNTER — Other Ambulatory Visit (HOSPITAL_BASED_OUTPATIENT_CLINIC_OR_DEPARTMENT_OTHER): Payer: Self-pay

## 2023-04-11 MED ORDER — AMPHETAMINE-DEXTROAMPHETAMINE 30 MG PO TABS
30.0000 mg | ORAL_TABLET | Freq: Two times a day (BID) | ORAL | 0 refills | Status: DC
  Filled 2023-04-12: qty 60, 30d supply, fill #0

## 2023-04-12 ENCOUNTER — Other Ambulatory Visit (HOSPITAL_BASED_OUTPATIENT_CLINIC_OR_DEPARTMENT_OTHER): Payer: Self-pay

## 2023-04-14 ENCOUNTER — Emergency Department (HOSPITAL_COMMUNITY): Payer: BC Managed Care – PPO

## 2023-04-14 ENCOUNTER — Emergency Department (HOSPITAL_COMMUNITY)
Admission: EM | Admit: 2023-04-14 | Discharge: 2023-04-14 | Disposition: A | Discharge status: Home / Self Care | Payer: BC Managed Care – PPO | Attending: Emergency Medicine | Admitting: Emergency Medicine

## 2023-04-14 DIAGNOSIS — R0789 Other chest pain: Secondary | ICD-10-CM | POA: Diagnosis not present

## 2023-04-14 DIAGNOSIS — F17210 Nicotine dependence, cigarettes, uncomplicated: Secondary | ICD-10-CM | POA: Insufficient documentation

## 2023-04-14 LAB — BASIC METABOLIC PANEL
Anion gap: 11 (ref 5–15)
BUN: 23 mg/dL — ABNORMAL HIGH (ref 6–20)
CO2: 22 mmol/L (ref 22–32)
Calcium: 9.3 mg/dL (ref 8.9–10.3)
Chloride: 103 mmol/L (ref 98–111)
Creatinine, Ser: 0.76 mg/dL (ref 0.61–1.24)
GFR, Estimated: >60 mL/min (ref >60)
Glucose, Bld: 97 mg/dL (ref 70–99)
Potassium: 3.8 mmol/L (ref 3.5–5.1)
Sodium: 136 mmol/L (ref 135–145)

## 2023-04-14 LAB — TROPONIN I (HIGH SENSITIVITY)
Troponin I (High Sensitivity): 4 ng/L (ref <18)
Troponin I (High Sensitivity): 5 ng/L (ref <18)

## 2023-04-14 LAB — CBC
HCT: 41.7 % (ref 39.0–52.0)
Hemoglobin: 14 g/dL (ref 13.0–17.0)
MCH: 31.7 pg (ref 26.0–34.0)
MCHC: 33.6 g/dL (ref 30.0–36.0)
MCV: 94.3 fL (ref 80.0–100.0)
Platelets: 159 10*3/uL (ref 150–400)
RBC: 4.42 MIL/uL (ref 4.22–5.81)
RDW: 12.8 % (ref 11.5–15.5)
WBC: 5 10*3/uL (ref 4.0–10.5)
nRBC: 0 % (ref 0.0–0.2)

## 2023-04-14 LAB — D-DIMER, QUANTITATIVE: D-Dimer, Quant: <0.27 ug/mL-FEU (ref 0.00–0.50)

## 2023-04-14 MED ORDER — PANTOPRAZOLE SODIUM 20 MG PO TBEC: 20.0000 mg | DELAYED_RELEASE_TABLET | Freq: Every day | ORAL | 0 refills | Status: DC

## 2023-04-14 MED ORDER — ACETAMINOPHEN 500 MG PO TABS
1000.0000 mg | ORAL_TABLET | Freq: Once | ORAL | Status: AC
  Administered 2023-04-14: 1000 mg via ORAL
  Filled 2023-04-14: qty 2

## 2023-04-14 MED ORDER — NITROGLYCERIN 0.4 MG SL SUBL
0.4000 mg | SUBLINGUAL_TABLET | SUBLINGUAL | Status: DC | PRN
  Administered 2023-04-14: 0.4 mg via SUBLINGUAL
  Filled 2023-04-14: qty 1

## 2023-04-14 MED ORDER — KETOROLAC TROMETHAMINE 15 MG/ML IJ SOLN
15.0000 mg | Freq: Once | INTRAMUSCULAR | Status: AC
  Administered 2023-04-14: 15 mg via INTRAVENOUS
  Filled 2023-04-14: qty 1

## 2023-04-14 MED ORDER — ACETAMINOPHEN 325 MG PO TABS: 650.0000 mg | ORAL_TABLET | Freq: Four times a day (QID) | ORAL | 0 refills | Status: DC | PRN

## 2023-04-14 MED ORDER — ALUM & MAG HYDROXIDE-SIMETH 200-200-20 MG/5ML PO SUSP
30.0000 mL | Freq: Once | ORAL | Status: AC
  Administered 2023-04-14: 30 mL via ORAL
  Filled 2023-04-14: qty 30

## 2023-04-14 NOTE — Discharge Instructions (Addendum)
Return to the Emergency Department if you have unusual chest pain, pressure, or discomfort, shortness of breath, nausea, vomiting, burping, heartburn, tingling upper body parts, sweating, cold, clammy skin, or racing heartbeat. Call 911 if you think you are having a heart attack. Follow cardiac diet - avoid fatty & fried foods, don't eat too much red meat, eat lots of fruits & vegetables, and dairy products should be low fat. Please lose weight if you are overweight. Become more active with walking, gardening, or any other activity that gets you to moving.   Please return to the emergency department immediately for any new or concerning symptoms, or if you get worse.   Please refrain from alcohol and tobacco use

## 2023-04-14 NOTE — ED Notes (Signed)
Pt reports improved pain but still present with deep breath.

## 2023-04-14 NOTE — ED Notes (Signed)
This RN reviewed discharge instructions with patient. he verbalized understanding and denied any further questions. PT well appearing upon discharge and reports tolerable pain. Pt ambulated with stable gait to exit. Pt endorses ride home.

## 2023-04-14 NOTE — ED Provider Notes (Signed)
[place] EMERGENCY DEPARTMENT AT [facility] Provider Note  CSN: [id number] Arrival date & time: 04/14/23 1211  Chief Complaint(s) Chest Pain  HPI Jonathan Lin is a 47 y.o. male with past medical history as below, significant for early alcohol use, daily tobacco use, bipolar 1 disorder, MDD, GERD who presents to the ED with complaint of midsternal chest pain.  Onset of symptoms around 9 AM this morning, gradually worsening.  Worse with deep inspiration or torso movement or patient of chest wall.  No difficulty breathing, nausea, vomiting, syncope or near syncope.  No palpitations.  No BRBPR or melena, no abdominal pain.  No fevers or chills.  Last alcohol use was last night, drinks approximately 1 hard liquor drink nightly, denies history of alcohol withdrawal in the past.  Denies history of similar symptoms in the past, he was given aspirin 3 2 4  mg prior to arrival by EMS  Past Medical History Past Medical History:  Diagnosis Date   Alcoholism (HCC)    Back pain    Bipolar 1 disorder (HCC)    Depression    Tumor cells, benign 12/24/2013   Back, with related pain and numbness.   Patient Active Problem List   Diagnosis Date Noted   Cigarette nicotine dependence without complication 03/03/2019   Cough in adult 03/03/2019   Alcohol dependence with uncomplicated withdrawal (HCC)    Major depressive disorder, recurrent episode, severe (HCC) 03/26/2015   Major depressive disorder, recurrent, severe without psychotic features (HCC) 03/26/2015   ADHD (attention deficit hyperactivity disorder) 10/30/2013   MDD (major depressive disorder) 10/29/2013   Alcoholic (HCC) 10/29/2013   Other and unspecified alcohol dependence, unspecified drinking behavior 10/29/2013   Tinea pedis 01/18/2010   Mood disorder (HCC) 12/21/2009   GERD 12/21/2009   Low back pain 12/21/2009   DRUG ABUSE, HX OF 12/21/2009   Home Medication(s) Prior to Admission medications   Medication Sig Start Date End  Date Taking? Authorizing Provider  acetaminophen (TYLENOL) 325 MG tablet Take 2 tablets (650 mg total) by mouth every 6 (six) hours as needed. 04/14/23  Yes Tanda Rockers A, DO  pantoprazole (PROTONIX) 20 MG tablet Take 1 tablet (20 mg total) by mouth daily for 14 days. 04/14/23 04/28/23 Yes Tanda Rockers A, DO  amphetamine-dextroamphetamine (ADDERALL) 30 MG tablet Take 1 tablet by mouth 2 (two) times daily. 02/02/20   [provider]  amphetamine-dextroamphetamine (ADDERALL) 30 MG tablet Take 1 tablet by mouth twice a day 01/11/22     amphetamine-dextroamphetamine (ADDERALL) 30 MG tablet Take 1 tablet by mouth twice a day 02/09/22     amphetamine-dextroamphetamine (ADDERALL) 30 MG tablet Take 1 tablet by mouth twice a day 03/12/22     amphetamine-dextroamphetamine (ADDERALL) 30 MG tablet Take 1 tablet by mouth twice a day 04/11/22     amphetamine-dextroamphetamine (ADDERALL) 30 MG tablet Take 1 tablet by mouth 2 times daily 05/11/22     amphetamine-dextroamphetamine (ADDERALL) 30 MG tablet Take 1 tablet by mouth twice a day 06/11/22     amphetamine-dextroamphetamine (ADDERALL) 30 MG tablet Take 1 tablet by mouth twice a day 07/11/22     amphetamine-dextroamphetamine (ADDERALL) 30 MG tablet Take 1 tablet by mouth 2 (two) times daily. 09/10/22     amphetamine-dextroamphetamine (ADDERALL) 30 MG tablet Take 1 tablet by mouth 2 (two) times daily. 10/10/22     amphetamine-dextroamphetamine (ADDERALL) 30 MG tablet Take 1 tablet by mouth 2 (two) times daily. 11/09/22     amphetamine-dextroamphetamine (ADDERALL) 30 MG tablet Take  1 tablet by mouth 2 (two) times daily. 01/08/23     amphetamine-dextroamphetamine (ADDERALL) 30 MG tablet Take 1 tablet by mouth 2 (two) times daily. 03/15/23     amphetamine-dextroamphetamine (ADDERALL) 30 MG tablet Take 1 tablet by mouth 2 (two) times daily. 04/12/23     calcium carbonate (TUMS - DOSED IN MG ELEMENTAL CALCIUM) 500 MG chewable tablet Chew 1 tablet by mouth 3 (three) times  daily as needed for indigestion or heartburn.    [provider]  zinc gluconate 50 MG tablet Take 100 mg by mouth daily. Patient not taking: Reported on 03/21/2021    [provider]                                                                                                                                    Past Surgical History Past Surgical History:  Procedure Laterality Date   extraction of wisdom teeth     WISDOM TOOTH EXTRACTION  1994   Family History Family History  Family history unknown: Yes    Social History Social History   Tobacco Use   Smoking status: Every Day    Packs/day: .5    Types: Cigarettes, E-cigarettes    Start date: 07/13/2021   Smokeless tobacco: Never  Vaping Use   Vaping Use: Every day   Substances: Nicotine, Homemade substance  Substance Use Topics   Alcohol use: Yes   Drug use: Not Currently    Types: Cocaine   Allergies Codeine  Review of Systems Review of Systems  Constitutional:  Negative for chills and fever.  HENT:  Negative for facial swelling and trouble swallowing.   Eyes:  Negative for photophobia and visual disturbance.  Respiratory:  Negative for cough and shortness of breath.   Cardiovascular:  Positive for chest pain. Negative for palpitations.  Gastrointestinal:  Negative for abdominal pain, nausea and vomiting.  Endocrine: Negative for polydipsia and polyuria.  Genitourinary:  Negative for difficulty urinating and hematuria.  Musculoskeletal:  Negative for gait problem and joint swelling.  Skin:  Negative for pallor and rash.  Neurological:  Negative for syncope and headaches.  Psychiatric/Behavioral:  Negative for agitation and confusion. The patient is nervous/anxious.     Physical Exam Vital Signs  I have reviewed the triage vital signs BP (!) 134/90   Pulse 66   Temp 98.1 F (36.7 C) (Oral)   Resp (!) 9   Ht 5\' 11"  (1.803 m)   Wt 79.4 kg   SpO2 98%   BMI 24.41 kg/m  Physical  Exam Vitals and nursing note reviewed.  Constitutional:      General: He is not in acute distress.    Appearance: He is well-developed.  HENT:     Head: Normocephalic and atraumatic.     Right Ear: External ear normal.     Left Ear: External ear normal.     Mouth/Throat:     Mouth:  Mucous membranes are moist.  Eyes:     General: No scleral icterus. Cardiovascular:     Rate and Rhythm: Normal rate and regular rhythm.     Pulses: Normal pulses.     Heart sounds: Normal heart sounds. No murmur heard. Pulmonary:     Effort: Pulmonary effort is normal. No respiratory distress.     Breath sounds: Normal breath sounds.  Chest:    Abdominal:     General: Abdomen is flat.     Palpations: Abdomen is soft.     Tenderness: There is no abdominal tenderness.  Musculoskeletal:        General: Normal range of motion.     Right lower leg: No edema.     Left lower leg: No edema.  Skin:    General: Skin is warm and dry.     Capillary Refill: Capillary refill takes less than 2 seconds.  Neurological:     Mental Status: He is alert and oriented to person, place, and time.  Psychiatric:        Mood and Affect: Mood normal.        Behavior: Behavior normal.     ED Results and Treatments Labs (all labs ordered are listed, but only abnormal results are displayed) Labs Reviewed  BASIC METABOLIC PANEL - Abnormal; Notable for the following components:      Result Value   BUN 23 (*)    All other components within normal limits  CBC  D-DIMER, QUANTITATIVE  TROPONIN I (HIGH SENSITIVITY)  TROPONIN I (HIGH SENSITIVITY)                                                                                                                          Radiology DG Chest Port 1 View  Result Date: 04/14/2023 CLINICAL DATA:  Chest pain EXAM: PORTABLE CHEST 1 VIEW COMPARISON:  March 02, 2019 FINDINGS: The heart size and mediastinal contours are within normal limits. Both lungs are clear. The visualized  skeletal structures are unremarkable. IMPRESSION: No active disease. Electronically Signed   By: Gerome Sam III M.D.   On: 04/14/2023 12:47    Pertinent labs & imaging results that were available during my care of the patient were reviewed by me and considered in my medical decision making (see MDM for details).  Medications Ordered in ED Medications  nitroGLYCERIN (NITROSTAT) SL tablet 0.4 mg (0.4 mg Sublingual Given 04/14/23 1357)  acetaminophen (TYLENOL) tablet 1,000 mg (has no administration in time range)  alum & mag hydroxide-simeth (MAALOX/MYLANTA) 200-200-20 MG/5ML suspension 30 mL (30 mLs Oral Given 04/14/23 1534)  ketorolac (TORADOL) 15 MG/ML injection 15 mg (15 mg Intravenous Given 04/14/23 1534)  Procedures Procedures  (including critical care time)  Medical Decision Making / ED Course    Medical Decision Making:    Markice Halliwell is a 47 y.o. male with past medical history as below, significant for early alcohol use, daily tobacco use, bipolar 1 disorder, MDD, GERD who presents to the ED with complaint of midsternal chest pain.. The complaint involves an extensive differential diagnosis and also carries with it a high risk of complications and morbidity.  Serious etiology was considered. Ddx includes but is not limited to: Differential includes all life-threatening causes for chest pain. This includes but is not exclusive to acute coronary syndrome, aortic dissection, pulmonary embolism, cardiac tamponade, community-acquired pneumonia, pericarditis, musculoskeletal chest wall pain, etc.   Complete initial physical exam performed, notably the patient  was no acute distress, hemodynamically stable.    Reviewed and confirmed nursing documentation for past medical history, family history, social history.  Vital signs reviewed.    Clinical Course as of  04/14/23 1732  Sun Apr 14, 2023  1344 Received ASA pta [SG]  1501 D-Dimer, Quant: <0.27 PE unlikely [SG]  1501 No sig improvement with NTG, will give GI cocktail [SG]    Clinical Course User Index [SG] Tanda Rockers A, DO   Symptoms greatly improved following GI cocktail, no significant treatment with nitroglycerin.   Labs stable Telemetry stable, EKG stable Well score is low, D-dimer is negative, PE is unlikely   The patient's chest pain is not suggestive of pulmonary embolus, cardiac ischemia, aortic dissection, pericarditis, myocarditis, pulmonary embolism, pneumothorax, pneumonia, Zoster, or esophageal perforation, or other serious etiology.  Historically not abrupt in onset, tearing or ripping, pulses symmetric. EKG nonspecific for ischemia/infarction. No dysrhythmias, brugada, WPW, prolonged QT noted.   Patient is prescribed Adderall but no other similar use, no cocaine or methamphetamine use Daily alcohol drinker, does not appear to be acute alcohol withdrawal at this time.   Troponin negative x2. CXR reviewed. Labs without demonstration of acute pathology unless otherwise noted above. Low HEART Score: 0-3 points (0.9-1.7% risk of MACE).  Given the extremely low risk of these diagnoses further testing and evaluation for these possibilities does not appear to be indicated at this time. Patient in no distress and overall condition improved here in the ED. Detailed discussions were had with the patient regarding current findings, and need for close f/u with PCP or on call doctor. The patient has been instructed to return immediately if the symptoms worsen in any way for re-evaluation. Patient verbalized understanding and is in agreement with current care plan. All questions answered prior to discharge.    Additional history obtained: -Additional history obtained from na -External records from outside source obtained and reviewed including: Chart review including previous notes, labs,  imaging, consultation notes including prior ED visits, prior labs and imaging, home medications   Lab Tests: -I ordered, reviewed, and interpreted labs.   The pertinent results include:   Labs Reviewed  BASIC METABOLIC PANEL - Abnormal; Notable for the following components:      Result Value   BUN 23 (*)    All other components within normal limits  CBC  D-DIMER, QUANTITATIVE  TROPONIN I (HIGH SENSITIVITY)  TROPONIN I (HIGH SENSITIVITY)    Notable for troponin negative x 2  EKG   EKG Interpretation  Date/Time:  Sunday April 14 2023 12:17:21 EDT Ventricular Rate:  83 PR Interval:  164 QRS Duration: 97 QT Interval:  384 QTC Calculation: 452 R Axis:   79 Text Interpretation:  Sinus rhythm ST elev, probable normal early repol pattern similar to prior no stemi Confirmed by Tanda Rockers ([id number]) on 04/14/2023 1:32:46 PM         Imaging Studies ordered: I ordered imaging studies including CXR I independently visualized the following imaging with scope of interpretation limited to determining acute life threatening conditions related to emergency care; findings noted above, significant for no ptx, stable imaging  I independently visualized and interpreted imaging. I agree with the radiologist interpretation   Medicines ordered and prescription drug management: Meds ordered this encounter  Medications   nitroGLYCERIN (NITROSTAT) SL tablet 0.4 mg   alum & mag hydroxide-simeth (MAALOX/MYLANTA) 200-200-20 MG/5ML suspension 30 mL   ketorolac (TORADOL) 15 MG/ML injection 15 mg   acetaminophen (TYLENOL) tablet 1,000 mg   pantoprazole (PROTONIX) 20 MG tablet    Sig: Take 1 tablet (20 mg total) by mouth daily for 14 days.    Dispense:  14 tablet    Refill:  0   acetaminophen (TYLENOL) 325 MG tablet    Sig: Take 2 tablets (650 mg total) by mouth every 6 (six) hours as needed.    Dispense:  36 tablet    Refill:  0    -I have reviewed the patients home medicines and have made  adjustments as needed   Consultations Obtained: na   Cardiac Monitoring: The patient was maintained on a cardiac monitor.  I personally viewed and interpreted the cardiac monitored which showed an underlying rhythm of: NSR  Social Determinants of Health:  Diagnosis or treatment significantly limited by social determinants of health: current smoker and alcohol use Counseled patient for approximately 3  minutes regarding smoking cessation. Discussed risks of smoking and how they applied and affected their visit here today. Patient not ready to quit at this time, however will follow up with their primary doctor when they are.   CPT code: 16109: intermediate counseling for smoking cessation     Reevaluation: After the interventions noted above, I reevaluated the patient and found that they have improved  Co morbidities that complicate the patient evaluation  Past Medical History:  Diagnosis Date   Alcoholism (HCC)    Back pain    Bipolar 1 disorder (HCC)    Depression    Tumor cells, benign 12/24/2013   Back, with related pain and numbness.      Dispostion: Disposition decision including need for hospitalization was considered, and patient discharged from emergency department.    Final Clinical Impression(s) / ED Diagnoses Final diagnoses:  Atypical chest pain     This chart was dictated using voice recognition software.  Despite best efforts to proofread,  errors can occur which can change the documentation meaning.    Sloan Leiter, DO 04/14/23 1733

## 2023-04-14 NOTE — ED Triage Notes (Signed)
PT BIB EMS from work for right central chest pain that is sharp with inhalation. Pt tearful. EMS gave 324 ASA, pt reports mild improvement.   EMS VS 148/92 104 HR 98% RA  20 R AC

## 2023-04-14 NOTE — ED Notes (Signed)
Provider at bedside

## 2023-05-09 ENCOUNTER — Other Ambulatory Visit (HOSPITAL_BASED_OUTPATIENT_CLINIC_OR_DEPARTMENT_OTHER): Payer: Self-pay

## 2023-05-09 MED ORDER — AMPHETAMINE-DEXTROAMPHETAMINE 30 MG PO TABS
30.0000 mg | ORAL_TABLET | Freq: Two times a day (BID) | ORAL | 0 refills | Status: DC
  Filled 2023-05-14: qty 60, 30d supply, fill #0

## 2023-05-10 ENCOUNTER — Other Ambulatory Visit: Payer: Self-pay

## 2023-05-10 ENCOUNTER — Other Ambulatory Visit (HOSPITAL_BASED_OUTPATIENT_CLINIC_OR_DEPARTMENT_OTHER): Payer: Self-pay

## 2023-05-14 ENCOUNTER — Other Ambulatory Visit (HOSPITAL_BASED_OUTPATIENT_CLINIC_OR_DEPARTMENT_OTHER): Payer: Self-pay

## 2023-06-11 ENCOUNTER — Other Ambulatory Visit (HOSPITAL_BASED_OUTPATIENT_CLINIC_OR_DEPARTMENT_OTHER): Payer: Self-pay

## 2023-06-11 MED ORDER — AMPHETAMINE-DEXTROAMPHETAMINE 30 MG PO TABS
30.0000 mg | ORAL_TABLET | Freq: Two times a day (BID) | ORAL | 0 refills | Status: DC
  Filled 2023-06-13: qty 60, 30d supply, fill #0

## 2023-06-13 ENCOUNTER — Other Ambulatory Visit (HOSPITAL_BASED_OUTPATIENT_CLINIC_OR_DEPARTMENT_OTHER): Payer: Self-pay

## 2023-07-11 ENCOUNTER — Other Ambulatory Visit (HOSPITAL_BASED_OUTPATIENT_CLINIC_OR_DEPARTMENT_OTHER): Payer: Self-pay

## 2023-07-11 MED ORDER — AMPHETAMINE-DEXTROAMPHETAMINE 30 MG PO TABS
30.0000 mg | ORAL_TABLET | Freq: Two times a day (BID) | ORAL | 0 refills | Status: DC
  Filled ????-??-??: fill #0

## 2023-07-12 ENCOUNTER — Other Ambulatory Visit (HOSPITAL_BASED_OUTPATIENT_CLINIC_OR_DEPARTMENT_OTHER): Payer: Self-pay

## 2023-07-12 MED ORDER — AMPHETAMINE-DEXTROAMPHETAMINE 30 MG PO TABS
30.0000 mg | ORAL_TABLET | Freq: Two times a day (BID) | ORAL | 0 refills | Status: DC
  Filled 2023-07-12: qty 60, 30d supply, fill #0

## 2023-08-12 ENCOUNTER — Other Ambulatory Visit (HOSPITAL_BASED_OUTPATIENT_CLINIC_OR_DEPARTMENT_OTHER): Payer: Self-pay

## 2023-08-12 MED ORDER — AMPHETAMINE-DEXTROAMPHETAMINE 30 MG PO TABS
30.0000 mg | ORAL_TABLET | Freq: Two times a day (BID) | ORAL | 0 refills | Status: DC
  Filled 2023-08-12: qty 60, 30d supply, fill #0

## 2023-09-12 ENCOUNTER — Other Ambulatory Visit (HOSPITAL_BASED_OUTPATIENT_CLINIC_OR_DEPARTMENT_OTHER): Payer: Self-pay

## 2023-09-12 MED ORDER — AMPHETAMINE-DEXTROAMPHETAMINE 30 MG PO TABS
30.0000 mg | ORAL_TABLET | Freq: Two times a day (BID) | ORAL | 0 refills | Status: DC
  Filled 2023-09-12: qty 60, 30d supply, fill #0

## 2023-10-08 ENCOUNTER — Other Ambulatory Visit (HOSPITAL_BASED_OUTPATIENT_CLINIC_OR_DEPARTMENT_OTHER): Payer: Self-pay

## 2023-10-08 MED ORDER — CLONAZEPAM 1 MG PO TABS
1.0000 mg | ORAL_TABLET | Freq: Every day | ORAL | 2 refills | Status: DC | PRN
  Filled 2023-10-08: qty 10, 30d supply, fill #0
  Filled 2023-11-11: qty 10, 30d supply, fill #1
  Filled 2023-12-11 (×2): qty 10, 30d supply, fill #2

## 2023-10-08 MED ORDER — AMPHETAMINE-DEXTROAMPHETAMINE 30 MG PO TABS
30.0000 mg | ORAL_TABLET | Freq: Two times a day (BID) | ORAL | 0 refills | Status: DC
  Filled 2023-10-14: qty 60, 30d supply, fill #0

## 2023-10-14 ENCOUNTER — Other Ambulatory Visit (HOSPITAL_BASED_OUTPATIENT_CLINIC_OR_DEPARTMENT_OTHER): Payer: Self-pay

## 2023-10-22 ENCOUNTER — Other Ambulatory Visit (HOSPITAL_BASED_OUTPATIENT_CLINIC_OR_DEPARTMENT_OTHER): Payer: Self-pay

## 2023-11-11 ENCOUNTER — Other Ambulatory Visit (HOSPITAL_BASED_OUTPATIENT_CLINIC_OR_DEPARTMENT_OTHER): Payer: Self-pay

## 2023-11-11 MED ORDER — AMPHETAMINE-DEXTROAMPHETAMINE 30 MG PO TABS
30.0000 mg | ORAL_TABLET | Freq: Two times a day (BID) | ORAL | 0 refills | Status: DC
  Filled 2023-11-12 (×2): qty 60, 30d supply, fill #0

## 2023-11-12 ENCOUNTER — Other Ambulatory Visit (HOSPITAL_BASED_OUTPATIENT_CLINIC_OR_DEPARTMENT_OTHER): Payer: Self-pay

## 2023-12-09 ENCOUNTER — Other Ambulatory Visit (HOSPITAL_COMMUNITY): Payer: Self-pay

## 2023-12-09 ENCOUNTER — Other Ambulatory Visit (HOSPITAL_BASED_OUTPATIENT_CLINIC_OR_DEPARTMENT_OTHER): Payer: Self-pay

## 2023-12-09 MED ORDER — AMPHETAMINE-DEXTROAMPHETAMINE 30 MG PO TABS
30.0000 mg | ORAL_TABLET | Freq: Two times a day (BID) | ORAL | 0 refills | Status: DC
  Filled 2023-12-13: qty 60, 30d supply, fill #0

## 2023-12-11 ENCOUNTER — Other Ambulatory Visit (HOSPITAL_BASED_OUTPATIENT_CLINIC_OR_DEPARTMENT_OTHER): Payer: Self-pay

## 2023-12-11 ENCOUNTER — Other Ambulatory Visit: Payer: Self-pay

## 2023-12-13 ENCOUNTER — Other Ambulatory Visit (HOSPITAL_BASED_OUTPATIENT_CLINIC_OR_DEPARTMENT_OTHER): Payer: Self-pay

## 2023-12-13 ENCOUNTER — Other Ambulatory Visit: Payer: Self-pay

## 2024-01-13 ENCOUNTER — Other Ambulatory Visit (HOSPITAL_BASED_OUTPATIENT_CLINIC_OR_DEPARTMENT_OTHER): Payer: Self-pay

## 2024-01-13 MED ORDER — AMPHETAMINE-DEXTROAMPHETAMINE 30 MG PO TABS
30.0000 mg | ORAL_TABLET | Freq: Two times a day (BID) | ORAL | 0 refills | Status: DC
  Filled 2024-02-12: qty 60, 30d supply, fill #0

## 2024-01-13 MED ORDER — AMPHETAMINE-DEXTROAMPHETAMINE 30 MG PO TABS
30.0000 mg | ORAL_TABLET | Freq: Two times a day (BID) | ORAL | 0 refills | Status: DC
  Filled 2024-03-13: qty 60, 30d supply, fill #0

## 2024-01-13 MED ORDER — AMPHETAMINE-DEXTROAMPHETAMINE 30 MG PO TABS
30.0000 mg | ORAL_TABLET | Freq: Two times a day (BID) | ORAL | 0 refills | Status: DC
  Filled 2024-01-13: qty 60, 30d supply, fill #0

## 2024-02-11 ENCOUNTER — Other Ambulatory Visit: Payer: Self-pay

## 2024-02-11 ENCOUNTER — Other Ambulatory Visit (HOSPITAL_BASED_OUTPATIENT_CLINIC_OR_DEPARTMENT_OTHER): Payer: Self-pay

## 2024-02-11 MED ORDER — CLONAZEPAM 1 MG PO TABS
1.0000 mg | ORAL_TABLET | Freq: Every day | ORAL | 1 refills | Status: DC | PRN
  Filled 2024-02-11: qty 10, 30d supply, fill #0

## 2024-02-12 ENCOUNTER — Other Ambulatory Visit (HOSPITAL_BASED_OUTPATIENT_CLINIC_OR_DEPARTMENT_OTHER): Payer: Self-pay

## 2024-02-21 ENCOUNTER — Other Ambulatory Visit (HOSPITAL_BASED_OUTPATIENT_CLINIC_OR_DEPARTMENT_OTHER): Payer: Self-pay

## 2024-03-08 ENCOUNTER — Encounter (HOSPITAL_COMMUNITY): Payer: Self-pay

## 2024-03-08 ENCOUNTER — Other Ambulatory Visit: Payer: Self-pay

## 2024-03-08 ENCOUNTER — Emergency Department (HOSPITAL_COMMUNITY)
Admission: EM | Admit: 2024-03-08 | Discharge: 2024-03-08 | Disposition: A | Discharge status: Home / Self Care | Payer: Self-pay | Attending: Emergency Medicine | Admitting: Emergency Medicine

## 2024-03-08 DIAGNOSIS — L639 Alopecia areata, unspecified: Secondary | ICD-10-CM | POA: Insufficient documentation

## 2024-03-08 NOTE — ED Provider Notes (Signed)
 [place] EMERGENCY DEPARTMENT AT [facility] Provider Note   CSN: [id number] Arrival date & time: 03/08/24  0539     History  Chief Complaint  Patient presents with   Alopecia    Jonathan Lin is a 48 y.o. male, history of bipolar disorder, who presents to the ED secondary to clumps of his hair falling out for the last 2 months.  He states clumps of his hair, been falling out for the last 2 months, so he decided to shave his hair, and it is now growing back, in clumps, but there are large clumps missing.  He notes that the hair will not grow back in the spots.  Notes that he is not sure if he has ringworm, because his aunt, who is a Engineer, civil (consulting), who works at a prison, told him to get it checked out.  He denies any kind of rash, redness, or crusting.   Home Medications Prior to Admission medications   Medication Sig Start Date End Date Taking? Authorizing Provider  acetaminophen  (TYLENOL ) 325 MG tablet Take 2 tablets (650 mg total) by mouth every 6 (six) hours as needed. 04/14/23   Teddi Favors, DO  amphetamine -dextroamphetamine  (ADDERALL) 30 MG tablet Take 1 tablet by mouth 2 (two) times daily. 02/02/20   [provider]  amphetamine -dextroamphetamine  (ADDERALL) 30 MG tablet Take 1 tablet by mouth twice a day 01/11/22     amphetamine -dextroamphetamine  (ADDERALL) 30 MG tablet Take 1 tablet by mouth twice a day 02/09/22     amphetamine -dextroamphetamine  (ADDERALL) 30 MG tablet Take 1 tablet by mouth twice a day 03/12/22     amphetamine -dextroamphetamine  (ADDERALL) 30 MG tablet Take 1 tablet by mouth twice a day 04/11/22     amphetamine -dextroamphetamine  (ADDERALL) 30 MG tablet Take 1 tablet by mouth 2 times daily 05/11/22     amphetamine -dextroamphetamine  (ADDERALL) 30 MG tablet Take 1 tablet by mouth twice a day 06/11/22     amphetamine -dextroamphetamine  (ADDERALL) 30 MG tablet Take 1 tablet by mouth twice a day 07/11/22     amphetamine -dextroamphetamine  (ADDERALL) 30 MG tablet Take  1 tablet by mouth 2 (two) times daily. 09/10/22     amphetamine -dextroamphetamine  (ADDERALL) 30 MG tablet Take 1 tablet by mouth 2 (two) times daily. 10/10/22     amphetamine -dextroamphetamine  (ADDERALL) 30 MG tablet Take 1 tablet by mouth 2 (two) times daily. 11/09/22     amphetamine -dextroamphetamine  (ADDERALL) 30 MG tablet Take 1 tablet by mouth 2 (two) times daily. 01/08/23     amphetamine -dextroamphetamine  (ADDERALL) 30 MG tablet Take 1 tablet by mouth 2 (two) times daily. 03/15/23     amphetamine -dextroamphetamine  (ADDERALL) 30 MG tablet Take 1 tablet by mouth 2 (two) times daily. 04/12/23     amphetamine -dextroamphetamine  (ADDERALL) 30 MG tablet Take 1 tablet by mouth 2 (two) times daily. 05/14/23     amphetamine -dextroamphetamine  (ADDERALL) 30 MG tablet Take 1 tablet (30 mg total) by mouth 2 (two) times daily. 09/12/23     amphetamine -dextroamphetamine  (ADDERALL) 30 MG tablet Take 1 tablet (30 mg total) by mouth 2 (two) times daily. 11/12/23     amphetamine -dextroamphetamine  (ADDERALL) 30 MG tablet Take 1 tablet by mouth 2 (two) times daily. 01/13/24     amphetamine -dextroamphetamine  (ADDERALL) 30 MG tablet Take 1 tablet by mouth 2 (two) times daily. 02/12/24     amphetamine -dextroamphetamine  (ADDERALL) 30 MG tablet Take 1 tablet by mouth 2 (two) times daily. 03/13/24     calcium carbonate (TUMS - DOSED IN MG ELEMENTAL CALCIUM) 500 MG chewable tablet Chew  1 tablet by mouth 3 (three) times daily as needed for indigestion or heartburn.    [provider]  clonazePAM  (KLONOPIN ) 1 MG tablet Take 1 tablet (1 mg total) by mouth daily as needed. *Each fill must last 30 days.* 02/11/24     pantoprazole  (PROTONIX ) 20 MG tablet Take 1 tablet (20 mg total) by mouth daily for 14 days. 04/14/23 04/28/23  Russella Courts A, DO  zinc gluconate 50 MG tablet Take 100 mg by mouth daily. Patient not taking: Reported on 03/21/2021    [provider]      Allergies    Codeine    Review of Systems   Review of  Systems  Skin:  Negative for rash.    Physical Exam Updated Vital Signs BP (!) 139/92 (BP Location: Right Arm)   Pulse 74   Temp 97.9 F (36.6 C) (Oral)   Resp 18   Ht 5\' 11"  (1.803 m)   Wt 79.8 kg   SpO2 100%   BMI 24.55 kg/m  Physical Exam Vitals and nursing note reviewed.  Constitutional:      General: He is not in acute distress.    Appearance: He is well-developed.  HENT:     Head: Normocephalic and atraumatic.  Eyes:     General:        Right eye: No discharge.        Left eye: No discharge.     Conjunctiva/sclera: Conjunctivae normal.  Pulmonary:     Effort: No respiratory distress.  Skin:    Comments: Large patches, missing, of hair, no crusting or scaling noted.  No redness, or rashes noted.  See picture for further detail  Neurological:     Mental Status: He is alert.     Comments: Clear speech.   Psychiatric:        Behavior: Behavior normal.        Thought Content: Thought content normal.     ED Results / Procedures / Treatments   Labs (all labs ordered are listed, but only abnormal results are displayed) Labs Reviewed - No data to display  EKG None  Radiology No results found.  Procedures Procedures    Medications Ordered in ED Medications - No data to display  ED Course/ Medical Decision Making/ A&P                                 Medical Decision Making Patient is a 48 year old male, who has had large clumps of his hair, falling out for the last 2 months, states he shaved and now it is not growing back.  See picture below.  He has no crusting or scaling or redness, to suggest any kind of tinea of the scalp, denies any, IV drug use.  Is overall well-appearing, no nodules noted.  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  Discharged home with strict return precautions   l Impression(s) / ED Diagnoses Final diagnoses:  Alopecia areata     Rx / DC Orders ED Discharge Orders     None         Kinlie Janice, Dwaine Gip, PA 03/08/24 1610    Almond Army, MD 03/09/24 2219

## 2024-03-08 NOTE — ED Triage Notes (Signed)
 Pt states that he has noticed that he was losing his hair gradually x2 months ago. Shaved it last week and noted to have multiple patches in his head. States that the patches hasn't gotten any better and felt as if he needed to come get it checked out to r/o a ringworm infection to his head.

## 2024-03-08 NOTE — Discharge Instructions (Signed)
 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

## 2024-03-13 ENCOUNTER — Other Ambulatory Visit (HOSPITAL_BASED_OUTPATIENT_CLINIC_OR_DEPARTMENT_OTHER): Payer: Self-pay

## 2024-09-27 ENCOUNTER — Encounter (HOSPITAL_COMMUNITY): Payer: Self-pay | Admitting: Adult Health

## 2024-09-27 ENCOUNTER — Other Ambulatory Visit: Payer: Self-pay

## 2024-09-27 ENCOUNTER — Inpatient Hospital Stay (HOSPITAL_COMMUNITY)
Admission: AD | Admit: 2024-09-27 | Discharge: 2024-09-29 | DRG: 897 | Disposition: A | Discharge status: Home / Self Care | Source: Intra-hospital

## 2024-09-27 ENCOUNTER — Emergency Department (HOSPITAL_COMMUNITY)
Admission: EM | Admit: 2024-09-27 | Discharge: 2024-09-27 | Disposition: A | Discharge status: Other Acute Inpatient Hospital | Payer: Self-pay | Attending: Emergency Medicine | Admitting: Emergency Medicine

## 2024-09-27 DIAGNOSIS — F1024 Alcohol dependence with alcohol-induced mood disorder: Secondary | ICD-10-CM | POA: Diagnosis present

## 2024-09-27 DIAGNOSIS — Z720 Tobacco use: Secondary | ICD-10-CM

## 2024-09-27 DIAGNOSIS — Z885 Allergy status to narcotic agent status: Secondary | ICD-10-CM | POA: Diagnosis not present

## 2024-09-27 DIAGNOSIS — F10229 Alcohol dependence with intoxication, unspecified: Secondary | ICD-10-CM | POA: Diagnosis not present

## 2024-09-27 DIAGNOSIS — F17290 Nicotine dependence, other tobacco product, uncomplicated: Secondary | ICD-10-CM | POA: Diagnosis present

## 2024-09-27 DIAGNOSIS — F319 Bipolar disorder, unspecified: Secondary | ICD-10-CM | POA: Diagnosis present

## 2024-09-27 DIAGNOSIS — Z560 Unemployment, unspecified: Secondary | ICD-10-CM | POA: Diagnosis not present

## 2024-09-27 DIAGNOSIS — F332 Major depressive disorder, recurrent severe without psychotic features: Secondary | ICD-10-CM

## 2024-09-27 DIAGNOSIS — F909 Attention-deficit hyperactivity disorder, unspecified type: Secondary | ICD-10-CM | POA: Diagnosis present

## 2024-09-27 DIAGNOSIS — Z638 Other specified problems related to primary support group: Secondary | ICD-10-CM

## 2024-09-27 DIAGNOSIS — F1511 Other stimulant abuse, in remission: Secondary | ICD-10-CM | POA: Diagnosis present

## 2024-09-27 DIAGNOSIS — F17210 Nicotine dependence, cigarettes, uncomplicated: Secondary | ICD-10-CM | POA: Diagnosis present

## 2024-09-27 DIAGNOSIS — Z5902 Unsheltered homelessness: Secondary | ICD-10-CM

## 2024-09-27 DIAGNOSIS — F320 Major depressive disorder, single episode, mild: Secondary | ICD-10-CM

## 2024-09-27 DIAGNOSIS — G629 Polyneuropathy, unspecified: Secondary | ICD-10-CM | POA: Diagnosis present

## 2024-09-27 DIAGNOSIS — Z635 Disruption of family by separation and divorce: Secondary | ICD-10-CM

## 2024-09-27 DIAGNOSIS — F1010 Alcohol abuse, uncomplicated: Secondary | ICD-10-CM

## 2024-09-27 DIAGNOSIS — Z716 Tobacco abuse counseling: Secondary | ICD-10-CM | POA: Diagnosis not present

## 2024-09-27 DIAGNOSIS — Z8659 Personal history of other mental and behavioral disorders: Secondary | ICD-10-CM | POA: Diagnosis not present

## 2024-09-27 DIAGNOSIS — F419 Anxiety disorder, unspecified: Secondary | ICD-10-CM | POA: Diagnosis present

## 2024-09-27 DIAGNOSIS — F17200 Nicotine dependence, unspecified, uncomplicated: Secondary | ICD-10-CM | POA: Insufficient documentation

## 2024-09-27 DIAGNOSIS — Y906 Blood alcohol level of 120-199 mg/100 ml: Secondary | ICD-10-CM | POA: Insufficient documentation

## 2024-09-27 LAB — COMPREHENSIVE METABOLIC PANEL WITH GFR
ALT: 145 U/L — ABNORMAL HIGH (ref 0–44)
AST: 81 U/L — ABNORMAL HIGH (ref 15–41)
Albumin: 4.3 g/dL (ref 3.5–5.0)
Alkaline Phosphatase: 56 U/L (ref 38–126)
Anion gap: 14 (ref 5–15)
BUN: 8 mg/dL (ref 6–20)
CO2: 22 mmol/L (ref 22–32)
Calcium: 9.5 mg/dL (ref 8.9–10.3)
Chloride: 103 mmol/L (ref 98–111)
Creatinine, Ser: 0.6 mg/dL — ABNORMAL LOW (ref 0.61–1.24)
GFR, Estimated: >60 mL/min (ref >60)
Glucose, Bld: 97 mg/dL (ref 70–99)
Potassium: 3.5 mmol/L (ref 3.5–5.1)
Sodium: 139 mmol/L (ref 135–145)
Total Bilirubin: 0.3 mg/dL (ref 0.0–1.2)
Total Protein: 7.4 g/dL (ref 6.5–8.1)

## 2024-09-27 LAB — CBC
HCT: 45.3 % (ref 39.0–52.0)
Hemoglobin: 15.1 g/dL (ref 13.0–17.0)
MCH: 31.6 pg (ref 26.0–34.0)
MCHC: 33.3 g/dL (ref 30.0–36.0)
MCV: 94.8 fL (ref 80.0–100.0)
Platelets: 179 K/uL (ref 150–400)
RBC: 4.78 MIL/uL (ref 4.22–5.81)
RDW: 13 % (ref 11.5–15.5)
WBC: 5.5 K/uL (ref 4.0–10.5)
nRBC: 0 % (ref 0.0–0.2)

## 2024-09-27 LAB — URINE DRUG SCREEN
Amphetamines: NEGATIVE
Barbiturates: NEGATIVE
Benzodiazepines: NEGATIVE
Cocaine: NEGATIVE
Fentanyl: NEGATIVE
Methadone Scn, Ur: NEGATIVE
Opiates: NEGATIVE
Tetrahydrocannabinol: NEGATIVE

## 2024-09-27 LAB — ETHANOL: Alcohol, Ethyl (B): 128 mg/dL — ABNORMAL HIGH (ref <15)

## 2024-09-27 LAB — ACETAMINOPHEN LEVEL: Acetaminophen (Tylenol), Serum: <10 ug/mL — ABNORMAL LOW (ref 10–30)

## 2024-09-27 LAB — SALICYLATE LEVEL: Salicylate Lvl: <7 mg/dL — ABNORMAL LOW (ref 7.0–30.0)

## 2024-09-27 MED ORDER — GABAPENTIN 300 MG PO CAPS
300.0000 mg | ORAL_CAPSULE | Freq: Three times a day (TID) | ORAL | Status: DC
  Administered 2024-09-27 – 2024-09-29 (×6): 300 mg via ORAL
  Filled 2024-09-27: qty 21
  Filled 2024-09-27 (×6): qty 1

## 2024-09-27 MED ORDER — GABAPENTIN 300 MG PO CAPS: 300.0000 mg | ORAL_CAPSULE | Freq: Three times a day (TID) | ORAL | Status: DC

## 2024-09-27 MED ORDER — CHLORDIAZEPOXIDE HCL 25 MG PO CAPS
25.0000 mg | ORAL_CAPSULE | Freq: Three times a day (TID) | ORAL | Status: AC
  Administered 2024-09-28 – 2024-09-29 (×3): 25 mg via ORAL
  Filled 2024-09-27 (×3): qty 1

## 2024-09-27 MED ORDER — HYDROXYZINE HCL 25 MG PO TABS: 25.0000 mg | ORAL_TABLET | Freq: Four times a day (QID) | ORAL | Status: DC | PRN

## 2024-09-27 MED ORDER — CHLORDIAZEPOXIDE HCL 25 MG PO CAPS
25.0000 mg | ORAL_CAPSULE | Freq: Once | ORAL | Status: AC
  Administered 2024-09-27: 25 mg via ORAL
  Filled 2024-09-27: qty 1

## 2024-09-27 MED ORDER — CHLORDIAZEPOXIDE HCL 25 MG PO CAPS: 25.0000 mg | ORAL_CAPSULE | ORAL | Status: DC

## 2024-09-27 MED ORDER — HALOPERIDOL LACTATE 5 MG/ML IJ SOLN: 10.0000 mg | Freq: Three times a day (TID) | INTRAMUSCULAR | Status: DC | PRN

## 2024-09-27 MED ORDER — ADULT MULTIVITAMIN W/MINERALS CH
1.0000 | ORAL_TABLET | Freq: Every day | ORAL | Status: DC
  Administered 2024-09-28 – 2024-09-29 (×2): 1 via ORAL
  Filled 2024-09-27 (×2): qty 1

## 2024-09-27 MED ORDER — CHLORDIAZEPOXIDE HCL 25 MG PO CAPS
25.0000 mg | ORAL_CAPSULE | Freq: Four times a day (QID) | ORAL | Status: DC
  Administered 2024-09-27: 25 mg via ORAL
  Filled 2024-09-27: qty 1

## 2024-09-27 MED ORDER — CHLORDIAZEPOXIDE HCL 25 MG PO CAPS
25.0000 mg | ORAL_CAPSULE | Freq: Four times a day (QID) | ORAL | Status: AC
  Administered 2024-09-27 – 2024-09-28 (×4): 25 mg via ORAL
  Filled 2024-09-27 (×4): qty 1

## 2024-09-27 MED ORDER — ONDANSETRON 4 MG PO TBDP: 4.0000 mg | ORAL_TABLET | Freq: Four times a day (QID) | ORAL | Status: DC | PRN

## 2024-09-27 MED ORDER — HYDROXYZINE HCL 25 MG PO TABS
25.0000 mg | ORAL_TABLET | Freq: Four times a day (QID) | ORAL | Status: DC | PRN
  Administered 2024-09-27 – 2024-09-28 (×2): 25 mg via ORAL
  Filled 2024-09-27 (×2): qty 1

## 2024-09-27 MED ORDER — LOPERAMIDE HCL 2 MG PO CAPS: 2.0000 mg | ORAL_CAPSULE | ORAL | Status: DC | PRN

## 2024-09-27 MED ORDER — ADULT MULTIVITAMIN W/MINERALS CH
1.0000 | ORAL_TABLET | Freq: Every day | ORAL | Status: DC
  Administered 2024-09-27: 1 via ORAL
  Filled 2024-09-27: qty 1

## 2024-09-27 MED ORDER — NICOTINE 21 MG/24HR TD PT24
21.0000 mg | MEDICATED_PATCH | Freq: Every day | TRANSDERMAL | Status: DC
  Administered 2024-09-27: 21 mg via TRANSDERMAL
  Filled 2024-09-27: qty 1

## 2024-09-27 MED ORDER — LORAZEPAM 2 MG/ML IJ SOLN: 2.0000 mg | Freq: Three times a day (TID) | INTRAMUSCULAR | Status: DC | PRN

## 2024-09-27 MED ORDER — CHLORDIAZEPOXIDE HCL 25 MG PO CAPS: 25.0000 mg | ORAL_CAPSULE | Freq: Four times a day (QID) | ORAL | Status: DC | PRN

## 2024-09-27 MED ORDER — NICOTINE 21 MG/24HR TD PT24
21.0000 mg | MEDICATED_PATCH | Freq: Every day | TRANSDERMAL | Status: DC
  Administered 2024-09-28 – 2024-09-29 (×2): 21 mg via TRANSDERMAL
  Filled 2024-09-27 (×2): qty 1

## 2024-09-27 MED ORDER — ALUM & MAG HYDROXIDE-SIMETH 200-200-20 MG/5ML PO SUSP: 30.0000 mL | ORAL | Status: DC | PRN

## 2024-09-27 MED ORDER — HALOPERIDOL 5 MG PO TABS: 5.0000 mg | ORAL_TABLET | Freq: Three times a day (TID) | ORAL | Status: DC | PRN

## 2024-09-27 MED ORDER — MAGNESIUM HYDROXIDE 400 MG/5ML PO SUSP: 30.0000 mL | Freq: Every day | ORAL | Status: DC | PRN

## 2024-09-27 MED ORDER — FLUOXETINE HCL 10 MG PO CAPS
10.0000 mg | ORAL_CAPSULE | Freq: Every day | ORAL | Status: DC
Start: 2024-09-27 — End: 2024-09-28
  Filled 2024-09-27: qty 1

## 2024-09-27 MED ORDER — IBUPROFEN 400 MG PO TABS: 400.0000 mg | ORAL_TABLET | Freq: Four times a day (QID) | ORAL | Status: DC | PRN

## 2024-09-27 MED ORDER — DIPHENHYDRAMINE HCL 50 MG/ML IJ SOLN: 50.0000 mg | Freq: Three times a day (TID) | INTRAMUSCULAR | Status: DC | PRN

## 2024-09-27 MED ORDER — CHLORDIAZEPOXIDE HCL 25 MG PO CAPS: 25.0000 mg | ORAL_CAPSULE | Freq: Three times a day (TID) | ORAL | Status: DC

## 2024-09-27 MED ORDER — DIPHENHYDRAMINE HCL 25 MG PO CAPS: 50.0000 mg | ORAL_CAPSULE | Freq: Three times a day (TID) | ORAL | Status: DC | PRN

## 2024-09-27 MED ORDER — FLUOXETINE HCL 10 MG PO CAPS
10.0000 mg | ORAL_CAPSULE | Freq: Every day | ORAL | Status: DC
Start: 2024-09-27 — End: 2024-09-27

## 2024-09-27 MED ORDER — THIAMINE HCL 100 MG/ML IJ SOLN
100.0000 mg | Freq: Once | INTRAMUSCULAR | Status: AC
  Administered 2024-09-27: 100 mg via INTRAMUSCULAR
  Filled 2024-09-27: qty 2

## 2024-09-27 MED ORDER — CHLORDIAZEPOXIDE HCL 25 MG PO CAPS: 25.0000 mg | ORAL_CAPSULE | Freq: Every day | ORAL | Status: DC

## 2024-09-27 MED ORDER — HALOPERIDOL LACTATE 5 MG/ML IJ SOLN: 5.0000 mg | Freq: Three times a day (TID) | INTRAMUSCULAR | Status: DC | PRN

## 2024-09-27 NOTE — ED Triage Notes (Signed)
 Pt lost 2 best friends in the last few months, lost house and custody so been drinking more than normal. Wanting alcohol  detox, possible referral to rehab. Drinking a pint of brandy a day, last drink pbr 30m pta. Hx seizures with detox. No drugs.

## 2024-09-27 NOTE — Group Note (Signed)
 Date:  09/27/2024 Time:  9:05 PM  Group Topic/Focus:  Wrap-Up Group:   The focus of this group is to help patients review their daily goal of treatment and discuss progress on daily workbooks.    Participation Level:  Did Not Attend  Participation Quality:  N/A  Affect:  N/A  Cognitive:  N/A  Insight: None  Engagement in Group:  N/A  Modes of Intervention:  N/A  Additional Comments:  Patient was encouraged but did not attend  Eward Mace 09/27/2024, 9:05 PM

## 2024-09-27 NOTE — ED Provider Notes (Signed)
 Patient is medically cleared   Suzette Pac, MD 09/27/24 1214

## 2024-09-27 NOTE — Plan of Care (Signed)
   Problem: Education: Goal: Emotional status will improve Outcome: Not Progressing Goal: Mental status will improve Outcome: Not Progressing

## 2024-09-27 NOTE — ED Notes (Addendum)
 Pt changed into scrubs, 2 belongings bags placed in cabinet for [employer]

## 2024-09-27 NOTE — Progress Notes (Signed)
 Admission Note: Patient is a 48 years old male admitted to the unit voluntarily from [facility] for alcohol  abuse, depression,and anxiety.  Denies SI/HI/AVH.  BAL at 128 prior to admission.  Patient is alert and oriented x 4.  Presents with a flat affect and depressed mood.  Reports recent losses of two close friends in the past few weeks.  Stated he is here for detox and to find placement due to homelessness.  Admission plan of care reviewed, consent signed.  Skin and personal belonging search completed.  Skin is dry and intact.  Items deemed contraband placed in the locker.  Patient oriented to the unit, staff and room.  Routine safety checks initiated.  Verbalizes understanding of unit rules/protocols.  Patient is safe on the unit.

## 2024-09-27 NOTE — Tx Team (Signed)
 Initial Treatment Plan 09/27/2024 4:21 PM Ranulfo Kall FMW:[id number]    PATIENT STRESSORS: Health problems   Loss of friend   Substance abuse     PATIENT STRENGTHS: Ability for insight  Average or above average intelligence  Communication skills  Motivation for treatment/growth    PATIENT IDENTIFIED PROBLEMS: To find a placement  To stop using alcohol   Depression  Anxiety  Homelessness  Ineffective coping skills           DISCHARGE CRITERIA:  Ability to meet basic life and health needs Motivation to continue treatment in a less acute level of care  PRELIMINARY DISCHARGE PLAN: Attend aftercare/continuing care group Outpatient therapy Placement in alternative living arrangements  PATIENT/FAMILY INVOLVEMENT: This treatment plan has been presented to and reviewed with the patient, Jonathan Lin, and/or family member.  The patient and family have been given the opportunity to ask questions and make suggestions.  Almarie MALVA Lowers, RN 09/27/2024, 4:21 PM

## 2024-09-27 NOTE — ED Notes (Signed)
 Pt given breakfast sandwich and orange juice.

## 2024-09-27 NOTE — Consult Note (Signed)
 [name] Psychiatric Consult Initial  Patient Name: .Jonathan Lin  MRN: [id number]  DOB: Mar 26, 1976  Consult Order details:  Orders (From admission, onward)     Start     Ordered   09/27/24 0733  CONSULT TO CALL ACT TEAM       Ordering Provider: Suzette Pac, MD  Provider:  (Not yet assigned)  Question:  Reason for Consult?  Answer:  severe depression   09/27/24 0732             Mode of Visit: Tele-visit Virtual Statement:TELE PSYCHIATRY ATTESTATION & CONSENT As the provider for this telehealth consult, I attest that I verified the patient's identity using two separate identifiers, introduced myself to the patient, provided my credentials, disclosed my location, and performed this encounter via a HIPAA-compliant, real-time, face-to-face, two-way, interactive audio and video platform and with the full consent and agreement of the patient (or guardian as applicable.) Patient physical location: [facility]. Telehealth provider physical location: home office in state of [place].   Video start time: 1210 Video end time: 1255    Psychiatry Consult Evaluation  Service Date: September 27, 2024 LOS:  LOS: 0 days  Chief Complaint  I'm wanting to detox from alcohol . I lost 2 friends recently and it made me think I need to make some changes.  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  Major Depressive Disorder, recurrent, severe 3.  Tobacco Use Disorder 4. Hx of ADHD, unspecified  Assessment  Jonathan Lin is a 48 y.o. male admitted: Presented to the [facility] 09/27/2024  6:05 AM voluntarily and accompanied by 2 friends, requesting alcohol  detox, and rehab referral.  He reports a hx for chronic alcoholism with mixed periods of sobriety. He endorses recent personal losses of 2 close friends over the past few weeks which has triggered him to come to the hospital today for care.   He carries the psychiatric diagnoses of alcohol   dependence, complicated withdrawal with seizures. 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    His current presentation of excessive ETOH usage, daily, BAL 128; and subsequent depressed mood with passive suicidal ideation, anxiousness, hand tremors is most consistent with alcohol  dependence with alcohol  induced mood disorder.  He meets criteria for referral for [facility] Crisis [facility] for ETOH detox. However, given [facility] is at capacity, he would benefit from psychiatric admission where he can receive treatment for long standing mood instability, untreated major depressive disorder and alcoholism.  Above, was discussed with patient who accepts admission.  He is currently not taking psychotropic medication; reports hx of multiple medication trials but today he can only recall taking adderall and clonazepam .  In the setting of alcohol  dependence, recommend against taking stimulant medications for safety concerns.  He was non compliant with medications prior to admission as evidenced by patient reports.   On initial examination, patient is alert and oriented to current circumstances. He has hand tremors, and endorses feeling anxious, related to alcohol  withdrawals. He is otherwise, able to engage in spontaneous communication with this clinical research associate and offers circumstantial responses.  He has passive suicidal thoughts but denies plan or intent; endorses goal to detox from alcohol  so he can regain visitation rights of his son. He denies homicidal thoughts, AVH and does not appear to be responding to internal stimulus.   Please see plan below for detailed recommendations.   Diagnoses:  Active  Hospital problems: Principal Problem:   Alcohol  dependence with alcohol -induced mood disorder (HCC) Active Problems:   ADHD (attention deficit hyperactivity disorder)   Major depressive disorder, recurrent episode, severe (HCC)   Cigarette nicotine   dependence without complication    Plan   ## Psychiatric Medication Recommendations:  Due to his hx of severe alcohol  withdrawal symptoms with seizures, CIWA librium  taper was started today.   -CIWA librium  protocol -Nicotine  Patch 21mg  daily -Gabapentin 300mg  po TID for alcohol  withdrawal and anxiety -Start Fluoxetine 10mg  po daily for depression with plan to optimize  ## Medical Decision Making Capacity: Not specifically addressed in this encounter  ## Further Work-up:  -- CIWA scores, EKG  TSH, B12, folate or U/A -- most recent EKG on 05/06/2023 had QtC of 384/452 -- Pertinent labwork reviewed earlier this admission includes: CMP, CBC, UDS, EKG  ## Disposition:--  We recommend inpatient psychiatric hospitalization when medically cleared. Patient is voluntary and does not meet criteria for IVC.   ## Behavioral / Environmental: - No specific recommendations at this time.     ## Safety and Observation Level:  - Based on my clinical evaluation, I estimate the patient to be at low risk of self harm in the current setting. - At this time, we recommend  routine. This decision is based on my review of the chart including patient's history and current presentation, interview of the patient, mental status examination, and consideration of suicide risk including evaluating suicidal ideation, plan, intent, suicidal or self-harm behaviors, risk factors, and protective factors. This judgment is based on our ability to directly address suicide risk, implement suicide prevention strategies, and develop a safety plan while the patient is in the clinical setting. Please contact our team if there is a concern that risk level has changed.  CSSR Risk Category:C-SSRS RISK CATEGORY: No Risk  Suicide Risk Assessment: Patient has following modifiable risk factors for suicide: recklessness, medication noncompliance, triggering events, and recent loss (death, isolation, vocation), which we are addressing by  referring for psychiatric admission for detox; to restart psychotropic medications to promote mood stability and safety monitoring. Patient has following non-modifiable or demographic risk factors for suicide: male gender and psychiatric hospitalization Patient has the following protective factors against suicide: Supportive friends  Thank you for this consult request. Recommendations have been communicated to the primary team.  We will sign off at this time.   Bernadette FORBES Barefoot, NP       History of Present Illness  Relevant Aspects of Hospital ED Course:  Admitted on 09/27/2024 for Per RN Triage Note dated 09/27/2024@0606 : Pt lost 2 best friends in the last few months, lost house and custody so been drinking more than normal. Wanting alcohol  detox, possible referral to rehab. Drinking a pint of brandy a day, last drink pbr 48m pta. Hx seizures with detox. No drugs.  Patient Report:  Patient presents seated on hospital gurney in private room, appears anxious but otherwise no apparent distress. Pt greeted and given anticipatory guidance, he agrees to continue.  He reports is primary concern is to detox from alcohol  and referral for rehab. He reports being triggered by the recent passing of 2 close friends, prompting his desire for care. He reports long standing hx for depression, impaired sleep, decreased appetite and always feeling ackward. However, he denies plan or intent to end his life as he reports being focused on regaining custody of his son.   Patient report his son, Jaden,  is 37 years old and lives with  mother in [place], [place].  He states his son's mom gained custody d/t his hx of drinking.   Pt reports hx of depression, anxiety, polysubstance abuse and alcoholism. He describes himself as a functional alcoholic reports he drinks daily to, feel comfortable and prevent withdrawal symptoms, anxiety, sweating, nausea.  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    Reports hx of alcohol  related seizure about 10 years ago.  Denies seizures since then.  He reports his sleep and appetite as fair, has difficulty falling asleep and remaining asleep, denies hx of taking sleep meds.  He states he does not much of an appetite but eats anyway.  He denies recent weight gain or loss within the past few months.   He tells me he's homeless and staying with a friend.  Reports he used to live with is girlfriend but her family kicked me out and helped her get housing somewhere else. He states he does not have family who are supportive of him. He reports hx for severe suicidal ideations that mostly occurred after drinking alcohol .  He denies prior suicide attempt. He denies hx for AVH, psychosis or paranoia.   Psych ROS:  Depression: denies Anxiety:  yes Mania (lifetime and current): denies Psychosis: (lifetime and current): denies   Collateral information:  Contacted patient and chart review  Review of Systems  Constitutional: Negative.   HENT: Negative.    Eyes: Negative.   Respiratory: Negative.    Cardiovascular: Negative.   Gastrointestinal:  Negative for abdominal pain, constipation, diarrhea, nausea and vomiting.  Genitourinary: Negative.   Musculoskeletal: Negative.   Skin: Negative.   Neurological:  Positive for tremors (bilateral hand tremors). Negative for dizziness, speech change, seizures, weakness and headaches.  Endo/Heme/Allergies: Negative.   Psychiatric/Behavioral:  Positive for depression and substance abuse. Negative for suicidal ideas (no plan or intent for self harm). The patient is nervous/anxious and has insomnia.      Psychiatric and Social History  Psychiatric History:  Information collected from patient  Prev Dx/Sx: as listed above Current Psych Provider: used to see Dr. Mitch, last visit earlier this year but stopped when he  lost his job and insurance, Feb 2025. Home Meds (current): not taking meds but used to take adderall 30mg  po qam and qpm Clonazepam  -does not recall dose  Previous Med Trials: reports previous trail of multiple antidepressants that he states did not work for him; he does recall being a zombie on medications; reports he was taking psychotropic medications and using alcohol  at the same time. Therapy: at residential at [facility] and [place], 2016.   Prior Psych Hospitalization: yes  Prior Self Harm: denies Prior Violence: denies  Family Psych History: unknown Family Hx suicide: unknown  Social History:  Developmental Hx: He is unsure but states his son's recent dx on the spectrum makes him suspect he may have similar concerns. He reports hx for social awkwardness. 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  Legal Hx: 2019- DUI and lost is license; drives but his license was not reinstated. States it was too expensive to have the breathalyzer removed.  Living Situation: stays with a friend after being to His mom  lives in [place], [place] but they do not talk His father passed away when he was 9 years old Spiritual Hx: denies Access to weapons/lethal means: denies   Substance History Alcohol : yes  Type of alcohol  pint of EJ daily,  Last Drink last night  Number of drinks per day as listed above History of alcohol  withdrawal seizures yes History of DT's yes Tobacco: cigarettes, < less than a pak daily Illicit drugs: cocaine use, any way I could get it, stopped cocaine and meth amphetamine  use 6 years ago Prescription drug abuse: he denies Rehab hx: hx for rehab, reports he went cold turkey and went months without using drug, alcohol  or cigarettes in  timeframe.  Reports his relapsed after family stressors with his son's family Exam   Findings  Physical Exam:  Vital Signs:  Temp:  [98 F (36.7 C)-98.8 F (37.1 C)] 98.8 F (37.1 C) (11/16 0941) Pulse Rate:  [63-87] 63 (11/16 0941) Resp:  [17-19] 17 (11/16 0941) BP: (111-138)/(75-98) 111/75 (11/16 0941) SpO2:  [96 %-97 %] 96 % (11/16 0941) Blood pressure 111/75, pulse 63, temperature 98.8 F (37.1 C), temperature source Oral, resp. rate 17, SpO2 96%. There is no height or weight on file to calculate BMI.  Physical Exam Cardiovascular:     Rate and Rhythm: Normal rate.  Pulmonary:     Effort: Pulmonary effort is normal.  Musculoskeletal:        General: Normal range of motion.     Cervical back: Normal range of motion.  Neurological:     Mental Status: He is alert and oriented to person, place, and time.  Psychiatric:        Attention and Perception: Attention and perception normal.        Mood and Affect: Mood is anxious and depressed. Affect is blunt.        Speech: Speech normal.        Behavior: Behavior normal. Behavior is cooperative.        Thought Content: Thought content is not paranoid or delusional. Thought content includes suicidal ideation. Thought content does not include homicidal ideation. Thought content does not include homicidal or suicidal plan.        Cognition and Memory: Cognition normal.        Judgment: Judgment is impulsive and inappropriate.     Mental Status Exam: General Appearance: fairly groomed white male, average stature; anxious appearing but no apparent distress  Orientation:  Full (Time, Place, and Person)  Memory:  Immediate;   Good Recent;   Fair Remote;   Fair  Concentration:  Concentration: Fair and Attention Span: Fair  Recall:  Fair  Attention  Fair  Eye Contact:  Good  Speech:  Normal Rate  Language:  Good  Volume:  Normal  Mood: Dysphoric but reports being anxious  Affect:  Blunt, Congruent, and Depressed  Thought Process:  Linear  Thought Content:  Logical  Suicidal Thoughts:  Yes.  without intent/plan   Homicidal Thoughts:  No  Judgement:  Poor  Insight:  Lacking  Psychomotor Activity:  Increased and Tremor  Akathisia:  No  Fund of Knowledge:  Fair      Assets:  Manufacturing Systems Engineer Resilience  Cognition:  WNL  ADL's:  Intact  AIMS (if indicated):        Other History   These have been pulled in through the EMR, reviewed, and updated if appropriate.  Family History:  The patient's Family history is unknown by patient.  Medical History:  Past Medical History:  Diagnosis Date   Alcoholism (HCC)    Back pain    Bipolar 1 disorder (HCC)    Depression    Tumor cells, benign 12/24/2013   Back, with related pain and numbness.    Surgical History: Past Surgical History:  Procedure Laterality Date   extraction of wisdom teeth     WISDOM TOOTH EXTRACTION  1994     Medications:   Current Facility-Administered Medications:    chlordiazePOXIDE  (LIBRIUM ) capsule 25 mg, 25 mg, Oral, Q6H PRN, Santos Hardwick E, NP   chlordiazePOXIDE  (LIBRIUM ) capsule 25 mg, 25 mg, Oral, QID **FOLLOWED BY** [START ON 09/28/2024] chlordiazePOXIDE  (LIBRIUM ) capsule 25 mg, 25 mg, Oral, TID **FOLLOWED BY** [START ON 09/29/2024] chlordiazePOXIDE  (LIBRIUM ) capsule 25 mg, 25 mg, Oral, BH-qamhs **FOLLOWED BY** [START ON 09/30/2024] chlordiazePOXIDE  (LIBRIUM ) capsule 25 mg, 25 mg, Oral, Daily, Lethia Donlon E, NP   hydrOXYzine  (ATARAX ) tablet 25 mg, 25 mg, Oral, Q6H PRN, Bray Vickerman E, NP   loperamide  (IMODIUM ) capsule 2-4 mg, 2-4 mg, Oral, PRN, Stephane Niemann E, NP   multivitamin with minerals tablet 1 tablet, 1 tablet, Oral, Daily, Kaizley Aja E, NP, 1 tablet at 09/27/24 1301   ondansetron  (ZOFRAN -ODT) disintegrating tablet 4 mg, 4 mg, Oral, Q6H PRN, Moishe Bernadette BRAVO, NP  Current Outpatient Medications:    acetaminophen  (TYLENOL ) 325 MG tablet, Take 2 tablets (650 mg total) by mouth every 6 (six) hours as needed., Disp: 36 tablet, Rfl: 0   amphetamine -dextroamphetamine  (ADDERALL) 30 MG tablet, Take 1  tablet by mouth 2 (two) times daily., Disp: , Rfl:    amphetamine -dextroamphetamine  (ADDERALL) 30 MG tablet, Take 1 tablet by mouth twice a day, Disp: 60 tablet, Rfl: 0   amphetamine -dextroamphetamine  (ADDERALL) 30 MG tablet, Take 1 tablet by mouth twice a day, Disp: 60 tablet, Rfl: 0   amphetamine -dextroamphetamine  (ADDERALL) 30 MG tablet, Take 1 tablet by mouth twice a day, Disp: 60 tablet, Rfl: 0   amphetamine -dextroamphetamine  (ADDERALL) 30 MG tablet, Take 1 tablet by mouth twice a day, Disp: 60 tablet, Rfl: 0   amphetamine -dextroamphetamine  (ADDERALL) 30 MG tablet, Take 1 tablet by mouth 2 times daily, Disp: 60 tablet, Rfl: 0   amphetamine -dextroamphetamine  (ADDERALL) 30 MG tablet, Take 1 tablet by mouth twice a day, Disp: 60 tablet, Rfl: 0   amphetamine -dextroamphetamine  (ADDERALL) 30 MG tablet, Take 1 tablet by mouth twice a day, Disp: 60 tablet, Rfl: 0   amphetamine -dextroamphetamine  (ADDERALL) 30 MG tablet, Take 1 tablet by mouth 2 (two) times daily., Disp: 60 tablet, Rfl: 0   amphetamine -dextroamphetamine  (ADDERALL) 30 MG tablet, Take 1 tablet by mouth 2 (two) times daily., Disp: 60 tablet, Rfl: 0   amphetamine -dextroamphetamine  (ADDERALL) 30 MG tablet, Take 1 tablet by mouth 2 (two) times daily., Disp: 60 tablet, Rfl: 0   amphetamine -dextroamphetamine  (ADDERALL) 30 MG tablet, Take 1 tablet by mouth 2 (two) times daily., Disp: 60 tablet, Rfl: 0   amphetamine -dextroamphetamine  (ADDERALL) 30 MG tablet, Take 1 tablet by mouth 2 (two) times daily., Disp: 60 tablet, Rfl: 0   amphetamine -dextroamphetamine  (ADDERALL) 30 MG tablet, Take 1 tablet by mouth 2 (two) times daily., Disp: 60 tablet, Rfl: 0   amphetamine -dextroamphetamine  (ADDERALL) 30 MG tablet, Take 1 tablet by mouth 2 (two) times daily., Disp: 60 tablet, Rfl: 0   amphetamine -dextroamphetamine  (ADDERALL) 30 MG tablet, Take 1 tablet (30 mg total) by mouth 2 (two) times daily., Disp: 60 tablet, Rfl: 0   amphetamine -dextroamphetamine   (ADDERALL) 30 MG tablet, Take 1 tablet (30 mg total) by mouth  2 (two) times daily., Disp: 60 tablet, Rfl: 0   amphetamine -dextroamphetamine  (ADDERALL) 30 MG tablet, Take 1 tablet by mouth 2 (two) times daily., Disp: 60 tablet, Rfl: 0   amphetamine -dextroamphetamine  (ADDERALL) 30 MG tablet, Take 1 tablet by mouth 2 (two) times daily., Disp: 60 tablet, Rfl: 0   amphetamine -dextroamphetamine  (ADDERALL) 30 MG tablet, Take 1 tablet by mouth 2 (two) times daily., Disp: 60 tablet, Rfl: 0   calcium carbonate (TUMS - DOSED IN MG ELEMENTAL CALCIUM) 500 MG chewable tablet, Chew 1 tablet by mouth 3 (three) times daily as needed for indigestion or heartburn., Disp: , Rfl:    clonazePAM  (KLONOPIN ) 1 MG tablet, Take 1 tablet (1 mg total) by mouth daily as needed. *Each fill must last 30 days.*, Disp: 10 tablet, Rfl: 1   pantoprazole  (PROTONIX ) 20 MG tablet, Take 1 tablet (20 mg total) by mouth daily for 14 days., Disp: 14 tablet, Rfl: 0   zinc gluconate 50 MG tablet, Take 100 mg by mouth daily. (Patient not taking: Reported on 03/21/2021), Disp: , Rfl:   Allergies: Allergies  Allergen Reactions   Codeine Nausea Only    Bernadette FORBES Barefoot, NP

## 2024-09-27 NOTE — ED Provider Notes (Signed)
 [place] EMERGENCY DEPARTMENT AT [facility] Provider Note   CSN: [id number] Arrival date & time: 09/27/24  0600     Patient presents with: Alcohol  Problem   Jonathan Lin is a 48 y.o. male.  {Add pertinent medical, surgical, social history, OB history to YEP:67052} Patient is depressed.  He recently lost 2 of his best friends and also lost his house.  He has been drinking a lot and wants some help.  The history is provided by the patient and medical records. No language interpreter was used.  Alcohol  Problem This is a recurrent problem. The current episode started more than 2 days ago. The problem occurs constantly. The problem has not changed since onset.Pertinent negatives include no chest pain, no abdominal pain and no headaches. Nothing aggravates the symptoms. Nothing relieves the symptoms. He has tried nothing for the symptoms.       Prior to Admission medications   Medication Sig Start Date End Date Taking? Authorizing Provider  acetaminophen  (TYLENOL ) 325 MG tablet Take 2 tablets (650 mg total) by mouth every 6 (six) hours as needed. 04/14/23   Elnor Jayson LABOR, DO  amphetamine -dextroamphetamine  (ADDERALL) 30 MG tablet Take 1 tablet by mouth 2 (two) times daily. 02/02/20   [provider]  amphetamine -dextroamphetamine  (ADDERALL) 30 MG tablet Take 1 tablet by mouth twice a day 01/11/22     amphetamine -dextroamphetamine  (ADDERALL) 30 MG tablet Take 1 tablet by mouth twice a day 02/09/22     amphetamine -dextroamphetamine  (ADDERALL) 30 MG tablet Take 1 tablet by mouth twice a day 03/12/22     amphetamine -dextroamphetamine  (ADDERALL) 30 MG tablet Take 1 tablet by mouth twice a day 04/11/22     amphetamine -dextroamphetamine  (ADDERALL) 30 MG tablet Take 1 tablet by mouth 2 times daily 05/11/22     amphetamine -dextroamphetamine  (ADDERALL) 30 MG tablet Take 1 tablet by mouth twice a day 06/11/22     amphetamine -dextroamphetamine  (ADDERALL) 30 MG tablet Take 1 tablet by  mouth twice a day 07/11/22     amphetamine -dextroamphetamine  (ADDERALL) 30 MG tablet Take 1 tablet by mouth 2 (two) times daily. 09/10/22     amphetamine -dextroamphetamine  (ADDERALL) 30 MG tablet Take 1 tablet by mouth 2 (two) times daily. 10/10/22     amphetamine -dextroamphetamine  (ADDERALL) 30 MG tablet Take 1 tablet by mouth 2 (two) times daily. 11/09/22     amphetamine -dextroamphetamine  (ADDERALL) 30 MG tablet Take 1 tablet by mouth 2 (two) times daily. 01/08/23     amphetamine -dextroamphetamine  (ADDERALL) 30 MG tablet Take 1 tablet by mouth 2 (two) times daily. 03/15/23     amphetamine -dextroamphetamine  (ADDERALL) 30 MG tablet Take 1 tablet by mouth 2 (two) times daily. 04/12/23     amphetamine -dextroamphetamine  (ADDERALL) 30 MG tablet Take 1 tablet by mouth 2 (two) times daily. 05/14/23     amphetamine -dextroamphetamine  (ADDERALL) 30 MG tablet Take 1 tablet (30 mg total) by mouth 2 (two) times daily. 09/12/23     amphetamine -dextroamphetamine  (ADDERALL) 30 MG tablet Take 1 tablet (30 mg total) by mouth 2 (two) times daily. 11/12/23     amphetamine -dextroamphetamine  (ADDERALL) 30 MG tablet Take 1 tablet by mouth 2 (two) times daily. 01/13/24     amphetamine -dextroamphetamine  (ADDERALL) 30 MG tablet Take 1 tablet by mouth 2 (two) times daily. 02/12/24     amphetamine -dextroamphetamine  (ADDERALL) 30 MG tablet Take 1 tablet by mouth 2 (two) times daily. 03/13/24     calcium carbonate (TUMS - DOSED IN MG ELEMENTAL CALCIUM) 500 MG chewable tablet Chew 1 tablet by mouth 3 (three) times  daily as needed for indigestion or heartburn.    [provider]  clonazePAM  (KLONOPIN ) 1 MG tablet Take 1 tablet (1 mg total) by mouth daily as needed. *Each fill must last 30 days.* 02/11/24     pantoprazole  (PROTONIX ) 20 MG tablet Take 1 tablet (20 mg total) by mouth daily for 14 days. 04/14/23 04/28/23  Elnor Savant A, DO  zinc gluconate 50 MG tablet Take 100 mg by mouth daily. Patient not taking: Reported on 03/21/2021     [provider]    Allergies: Codeine    Review of Systems  Constitutional:  Negative for appetite change and fatigue.  HENT:  Negative for congestion, ear discharge and sinus pressure.   Eyes:  Negative for discharge.  Respiratory:  Negative for cough.   Cardiovascular:  Negative for chest pain.  Gastrointestinal:  Negative for abdominal pain and diarrhea.  Genitourinary:  Negative for frequency and hematuria.  Musculoskeletal:  Negative for back pain.  Skin:  Negative for rash.  Neurological:  Negative for seizures and headaches.  Psychiatric/Behavioral:  Negative for hallucinations.        Depressed    Updated Vital Signs BP 136/86   Pulse 68   Temp 98.6 F (37 C) (Oral)   Resp 17   SpO2 98%   Physical Exam Vitals and nursing note reviewed.  Constitutional:      Appearance: He is well-developed.  HENT:     Head: Normocephalic.     Nose: Nose normal.  Eyes:     General: No scleral icterus.    Conjunctiva/sclera: Conjunctivae normal.  Neck:     Thyroid: No thyromegaly.  Cardiovascular:     Rate and Rhythm: Normal rate and regular rhythm.     Heart sounds: No murmur heard.    No friction rub. No gallop.  Pulmonary:     Breath sounds: No stridor. No wheezing or rales.  Chest:     Chest wall: No tenderness.  Abdominal:     General: There is no distension.     Tenderness: There is no abdominal tenderness. There is no rebound.  Musculoskeletal:        General: Normal range of motion.     Cervical back: Neck supple.  Lymphadenopathy:     Cervical: No cervical adenopathy.  Skin:    Findings: No erythema or rash.  Neurological:     Mental Status: He is alert and oriented to person, place, and time.     Motor: No abnormal muscle tone.     Coordination: Coordination normal.  Psychiatric:     Comments: Depressed but not suicidal or homicidal     (all labs ordered are listed, but only abnormal results are displayed) Labs Reviewed  COMPREHENSIVE  METABOLIC PANEL WITH GFR - Abnormal; Notable for the following components:      Result Value   Creatinine, Ser 0.60 (*)    AST 81 (*)    ALT 145 (*)    All other components within normal limits  ETHANOL - Abnormal; Notable for the following components:   Alcohol , Ethyl (B) 128 (*)    All other components within normal limits  ACETAMINOPHEN  LEVEL - Abnormal; Notable for the following components:   Acetaminophen  (Tylenol ), Serum <10 (*)    All other components within normal limits  SALICYLATE LEVEL - Abnormal; Notable for the following components:   Salicylate Lvl <7.0 (*)    All other components within normal limits  CBC  URINE DRUG SCREEN  EKG: None  Radiology: No results found.  {Document cardiac monitor, telemetry assessment procedure when appropriate:32947} Procedures   Medications Ordered in the ED  multivitamin with minerals tablet 1 tablet (1 tablet Oral Given 09/27/24 1301)  chlordiazePOXIDE  (LIBRIUM ) capsule 25 mg (has no administration in time range)  hydrOXYzine  (ATARAX ) tablet 25 mg (has no administration in time range)  loperamide  (IMODIUM ) capsule 2-4 mg (has no administration in time range)  ondansetron  (ZOFRAN -ODT) disintegrating tablet 4 mg (has no administration in time range)  chlordiazePOXIDE  (LIBRIUM ) capsule 25 mg (25 mg Oral Given 09/27/24 1428)    Followed by  chlordiazePOXIDE  (LIBRIUM ) capsule 25 mg (has no administration in time range)    Followed by  chlordiazePOXIDE  (LIBRIUM ) capsule 25 mg (has no administration in time range)    Followed by  chlordiazePOXIDE  (LIBRIUM ) capsule 25 mg (has no administration in time range)  nicotine  (NICODERM CQ  - dosed in mg/24 hours) patch 21 mg (21 mg Transdermal Patch Applied 09/27/24 1428)  gabapentin (NEURONTIN) capsule 300 mg (has no administration in time range)  FLUoxetine (PROZAC) capsule 10 mg (has no administration in time range)  thiamine  (VITAMIN B1) injection 100 mg (100 mg Intramuscular Given  09/27/24 1303)  chlordiazePOXIDE  (LIBRIUM ) capsule 25 mg (25 mg Oral Given 09/27/24 1300)     Patient is being evaluated by behavioral health for severe depression and alcohol  abuse. {Click here for ABCD2, HEART and other calculators REFRESH Note before signing:1}                              Medical Decision Making Amount and/or Complexity of Data Reviewed Labs: ordered.  Risk Decision regarding hospitalization.   Depression and alcohol  abuse.  Disposition will be determined after behavioral health consult  {Document critical care time when appropriate  Document review of labs and clinical decision tools ie CHADS2VASC2, etc  Document your independent review of radiology images and any outside records  Document your discussion with family members, caretakers and with consultants  Document social determinants of health affecting pt's care  Document your decision making why or why not admission, treatments were needed:32947:::1}   Final diagnoses:  None    ED Discharge Orders     None

## 2024-09-27 NOTE — ED Notes (Signed)
Patient given sandwich and crackers.

## 2024-09-27 NOTE — Group Note (Signed)
 Date:  09/27/2024 Time:  6:45 PM   Group Topic/Focus:  Group used a non- competitive card game to build mindful listening skills, and encourage acceptance and understanding of oneself and peers. Patients cooperate and share, fostering empathy and personal growth in a supportive environment.      Participation Level:  Did Not Attend  Jonathan Lin 09/27/2024, 6:45 PM

## 2024-09-27 NOTE — ED Notes (Signed)
Report given to RN at [facility] 

## 2024-09-27 NOTE — ED Notes (Signed)
Called safe transport for transportation to [facility]

## 2024-09-28 ENCOUNTER — Encounter (HOSPITAL_COMMUNITY): Payer: Self-pay

## 2024-09-28 DIAGNOSIS — F419 Anxiety disorder, unspecified: Secondary | ICD-10-CM

## 2024-09-28 MED ORDER — CLONIDINE HCL 0.1 MG PO TABS
0.1000 mg | ORAL_TABLET | Freq: Three times a day (TID) | ORAL | Status: DC | PRN
  Administered 2024-09-29: 0.1 mg via ORAL
  Filled 2024-09-28: qty 1

## 2024-09-28 MED ORDER — CLONIDINE HCL 0.1 MG PO TABS: 0.1000 mg | ORAL_TABLET | Freq: Three times a day (TID) | ORAL | Status: DC | PRN

## 2024-09-28 NOTE — Plan of Care (Signed)
   Problem: Education: Goal: Knowledge of [place] [facility] Education information/materials will improve Outcome: Progressing Goal: Emotional status will improve Outcome: Progressing Goal: Mental status will improve Outcome: Progressing Goal: Verbalization of understanding the information provided will improve Outcome: Progressing

## 2024-09-28 NOTE — BH Assessment (Signed)
(  Sleep Hours) - 7.75 (Any PRNs that were needed, meds refused, or side effects to meds)-  (Any disturbances and when (visitation, over night)- None (Concerns raised by the patient)- None (SI/HI/AVH)- Denies

## 2024-09-28 NOTE — Group Note (Signed)
 Date:  09/28/2024 Time:  1:36 PM  Group Topic/Focus: Physical wellness Physical wellness and mental health are deeply connected. When you take care of your body, you support your brain's ability to manage stress, regulate emotions, and function more effectively. Here's how physical wellness boosts mental well-being--and how to start improving both:      Participation Level:  Did Not Attend   Jonathan Lin 09/28/2024, 1:36 PM

## 2024-09-28 NOTE — Plan of Care (Signed)
   Problem: Education: Goal: Knowledge of [place] [facility] Education information/materials will improve Outcome: Progressing Goal: Emotional status will improve Outcome: Progressing Goal: Mental status will improve Outcome: Progressing

## 2024-09-28 NOTE — Group Note (Signed)
 Date:  09/28/2024 Time:  10:58 AM  Group Topic/Focus: Group Topic/Focus: Recreational Therapy  Patients played blind picturing  where the speaker describes the geometrical image in detail then the drawer has to recreate the image of the picture.      Participation Level:  Did Not Attend   Jonathan Lin 09/28/2024, 10:58 AM
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
## 2024-09-28 NOTE — Plan of Care (Signed)

## 2024-09-28 NOTE — [facility] Suicide Risk Assessment (Signed)
 [facility] Admission Suicide Risk Assessment   Patient presents with acute risk factors of significant psychosocial stressors (loss of girlfriend, loss of best friend, recent homelessness), escalating alcohol  use, depression and passive suicidal thoughts. He carries additional chronic risk factors of male gender, longstanding substance use concerns, previous hospitalizations, previously endorsed SI, lack of supports. Protective factors include no clear prior suicide attempts, no SI endorsed today, help-seeking behavior, has a son, expressing desire for sobriety and motivation to go to inpatient programming. Currently, risk of suicide is considered moderate.  We will admit to inpatient psychiatry, monitor through withdrawal and offer medications to address substance use concerns. Given severity of alcohol  usage goal will be to bridge directly to inpatient rehab if possible  I certify that inpatient services furnished can reasonably be expected to improve the patient's condition.   Jonathan LOISE Arts, MD 09/28/2024, 1:52 PM

## 2024-09-28 NOTE — Progress Notes (Addendum)
 D: Patient is alert, oriented, and cooperative. Denies SI, HI, AVH, and verbally contracts for safety.    1200 CIWA 3, 1730 CIWA 3  A: Patient refused prozac this morning. Other scheduled medications administered per MD order. Support provided. Patient educated on safety on the unit and medications. Routine safety checks every 15 minutes. Patient stated understanding to tell nurse about any new physical symptoms. Patient understands to tell staff of any needs.     R: No adverse drug reactions noted. Patient remains safe at this time and will continue to monitor.    09/28/24 1400  Psych Admission Type (Psych Patients Only)  Admission Status Voluntary  Psychosocial Assessment  Patient Complaints None  Eye Contact Brief  Facial Expression Flat  Affect Depressed  Speech Logical/coherent  Interaction Assertive  Motor Activity Other (Comment) (WNL)  Appearance/Hygiene Unremarkable  Behavior Characteristics Cooperative;Calm  Mood Depressed  Thought Process  Coherency WDL  Content WDL  Delusions None reported or observed  Perception WDL  Hallucination None reported or observed  Judgment Impaired  Confusion None  Danger to Self  Current suicidal ideation? Denies  Danger to Others  Danger to Others None reported or observed

## 2024-09-28 NOTE — Group Note (Signed)
 Date:  09/28/2024 Time:  10:15 AM  Group Topic/Focus: Goals group orientation Goals Group:   The focus of this group is to help patients establish daily goals to achieve during treatment and discuss how the patient can incorporate goal setting into their daily lives to aide in recovery.    Participation Level:  Did Not Attend  Jonathan Lin 09/28/2024, 10:15 AM

## 2024-09-28 NOTE — BH IP Treatment Plan (Signed)
 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) Active Problems:   ADHD (attention deficit hyperactivity disorder)   Anxiety disorder, unspecified   Current Medications:  Current Facility-Administered Medications  Medication Dose Route Frequency Provider Last Rate Last Admin   alum & mag hydroxide-simeth (MAALOX/MYLANTA) 200-200-20 MG/5ML suspension 30 mL  30 mL Oral Q4H PRN Mills, Shnese E, NP       chlordiazePOXIDE  (LIBRIUM ) capsule 25 mg  25 mg Oral Q6H PRN Mills, Shnese E, NP       chlordiazePOXIDE  (LIBRIUM ) capsule 25 mg  25 mg Oral TID Moishe Bernadette BRAVO, NP       Followed by   NOREEN ON 09/29/2024] chlordiazePOXIDE  (LIBRIUM ) capsule 25 mg  25 mg Oral BH-qamhs Moishe Bernadette E, NP       Followed by   NOREEN ON 10/01/2024] chlordiazePOXIDE  (LIBRIUM ) capsule 25 mg  25 mg Oral Daily Mills, Shnese E, NP       cloNIDine (CATAPRES) tablet 0.1 mg  0.1 mg Oral TID PRN Towana Leita SAILOR, MD       haloperidol (HALDOL) tablet 5 mg  5 mg Oral TID PRN Mills, Shnese E, NP       And   diphenhydrAMINE (BENADRYL) capsule 50 mg  50 mg Oral TID PRN Mills, Shnese E, NP       haloperidol lactate (HALDOL) injection 5 mg  5 mg Intramuscular TID PRN Mills, Shnese E, NP       And   diphenhydrAMINE (BENADRYL) injection 50 mg  50 mg Intramuscular TID PRN Moishe Bernadette BRAVO, NP       And   LORazepam  (ATIVAN ) injection 2 mg  2 mg Intramuscular TID PRN Mills, Shnese E, NP       haloperidol lactate (HALDOL) injection 10 mg  10 mg Intramuscular TID PRN Mills, Shnese E, NP       And   diphenhydrAMINE (BENADRYL) injection 50 mg  50 mg Intramuscular TID PRN Moishe Bernadette BRAVO, NP       And   LORazepam  (ATIVAN ) injection 2 mg  2 mg Intramuscular TID PRN Mills, Shnese E, NP       FLUoxetine (PROZAC) capsule 10 mg  10 mg Oral Daily Mills, Shnese E, NP       gabapentin (NEURONTIN) capsule 300 mg  300 mg Oral TID Mills, Shnese E, NP   300 mg at 09/28/24 1207   hydrOXYzine  (ATARAX ) tablet 25 mg  25 mg Oral Q6H PRN Mills, Shnese E, NP   25 mg at 09/27/24 2110   ibuprofen  (ADVIL ) tablet 400 mg  400 mg Oral Q6H PRN Mills, Shnese E, NP       loperamide  (IMODIUM ) capsule 2-4 mg  2-4 mg Oral PRN Mills, Shnese E, NP       magnesium  hydroxide (MILK OF MAGNESIA) suspension 30 mL  30 mL Oral Daily PRN Mills, Shnese E, NP       multivitamin with minerals tablet 1 tablet  1 tablet Oral Daily Moishe Bernadette E, NP   1 tablet at 09/28/24 0746   nicotine  (NICODERM CQ  - dosed in mg/24 hours) patch 21 mg  21 mg Transdermal Daily Mills, Shnese E, NP   21 mg at 09/28/24 [phone]  ondansetron  (ZOFRAN -ODT) disintegrating tablet 4 mg  4 mg Oral Q6H PRN Mills, Shnese E, NP       PTA Medications: No medications prior to admission.    Patient Stressors: Health problems   Loss of friend   Substance abuse    Patient Strengths: Ability for insight  Average or above average intelligence  Communication skills  Motivation for treatment/growth   Treatment Modalities: Medication Management, Group therapy, Case management,  1 to 1 session with clinician, Psychoeducation, Recreational therapy.   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  Therapeutic Interventions: 1 to 1 sessions, Unit Group sessions and Medication administration.  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  Therapeutic Interventions: 1 to 1 sessions, Unit Group sessions and Medication administration.  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) Long Term Goal(s): Knowledge of disease and therapeutic regimen to maintain health will improve  Short Term Goals: Ability to remain free from injury will improve, Ability to verbalize frustration and anger appropriately will improve, Ability to demonstrate self-control, Ability to participate in decision making will improve, Ability to verbalize feelings will improve, Ability to disclose and discuss suicidal ideas, Ability to identify and develop effective coping behaviors will improve, and Compliance with prescribed medications will improve  Medication Management: RN will administer medications as ordered by provider, will assess and evaluate patient's response and provide education to patient for prescribed medication. RN will report any adverse and/or side effects to prescribing provider.  Therapeutic Interventions: 1 on 1 counseling sessions, Psychoeducation, Medication administration, Evaluate responses to treatment, Monitor vital signs and CBGs as ordered, Perform/monitor CIWA, COWS, AIMS and Fall Risk screenings as ordered, Perform wound care treatments as ordered.  Evaluation of Outcomes: Not Progressing   LCSW Treatment Plan for Primary Diagnosis: Alcohol -induced depressive disorder with moderate or severe use disorder  with onset during intoxication (HCC) Long Term Goal(s): Safe transition to appropriate next level of care at discharge, Engage patient in therapeutic group addressing interpersonal concerns.  Short Term Goals: Engage patient in aftercare planning with referrals and resources, Increase social support, Increase ability to appropriately verbalize feelings, Increase emotional regulation, Facilitate acceptance of mental health diagnosis and concerns, Facilitate patient progression through stages of change regarding substance use diagnoses and concerns, Identify triggers associated with mental health/substance abuse issues, and Increase skills for wellness and recovery  Therapeutic Interventions: Assess for all discharge needs, 1 to 1 time with Social worker, Explore available resources and support systems, Assess for adequacy in community support network, Educate family and significant other(s) on suicide prevention, Complete Psychosocial Assessment, Interpersonal group therapy.  Evaluation of Outcomes: Not Progressing   Progress in Treatment: Attending groups: No. Participating in groups: No. Taking medication as prescribed: No. Toleration medication: Yes. Family/Significant other contact made: No, will contact:  consents pending. Patient understands diagnosis: Yes. Discussing patient identified problems/goals with staff: Yes. Medical problems stabilized or resolved: Yes. Denies suicidal/homicidal ideation: Yes. Issues/concerns per patient self-inventory: No. None reported.  New problem(s) identified: No, Describe:  None identified.  New Short Term/Long Term Goal(s): detox, medication management for mood stabilization; elimination of SI thoughts; development of comprehensive mental wellness/sobriety plan    Patient Goals: Just to detox from alcohol . Go to residential treatment.  Discharge Plan or Barriers: Patient recently admitted. CSW will continue to follow and assess for appropriate  referrals and possible discharge planning.    Reason for Continuation of Hospitalization: Anxiety Medication stabilization Withdrawal symptoms  Estimated Length of Stay: 3-5 days.   Last 3 [place] Suicide Severity Risk Score: Flowsheet Row Ad[facility] (Current) [facility] Most recent reading at 09/27/2024  4:06 PM ED from 09/27/2024 in [facility] Emergency Department at [facility] Most recent reading at 09/27/2024  6:13 AM ED from 03/08/2024 in [facility] Emergency Department at [facility] Most recent reading at 03/08/2024  6:00 AM  C-SSRS RISK CATEGORY No Risk No Risk No Risk    Last PHQ 2/9 Scores:     No data to display          Scribe for Treatment Team: Kanya Potteiger  Nunez-Uva, ISRAEL 09/28/2024 1:45 PM

## 2024-09-28 NOTE — Group Note (Signed)
 Recreation Therapy Group Note   Group Topic:Communication  Group Date: 09/28/2024 Start Time: 0945 End Time: 1020 Facilitators: Stefen Juba-McCall, LRT,CTRS Location: [address] Dayroom   Group Topic: Communication, Problem Solving   Goal Area(s) Addresses:  Patient will effectively listen to complete activity.  Patient will identify communication skills used to make activity successful.  Patient will identify how skills used during activity can be used to reach post d/c goals.    Behavioral Response:    Intervention: Building Surveyor Activity - Geometric pattern cards, pencils, blank paper    Activity: Geometric Drawings.  Three volunteers from the peer group will be shown an abstract picture with a particular arrangement of geometrical shapes.  Each round, one 'speaker' will describe the pattern, as accurately as possible without revealing the image to the group.  The remaining group members will listen and draw the picture to reflect how it is described to them. Patients with the role of 'listener' cannot ask clarifying questions but, may request that the speaker repeat a direction. Once the drawings are complete, the presenter will show the rest of the group the picture and compare how close each person came to drawing the picture. LRT will facilitate a post-activity discussion regarding effective communication and the importance of planning, listening, and asking for clarification in daily interactions with others.  Education: Environmental consultant, Active listening, Support systems, Discharge planning  Education Outcome: Acknowledges understanding/In group clarification offered/Needs additional education.    Affect/Mood: N/A   Participation Level: Did not attend    Clinical Observations/Individualized Feedback:      Plan: Continue to engage patient in RT group sessions 2-3x/week.   Rushie Brazel-McCall, LRT,CTRS 09/28/2024 1:17 PM

## 2024-09-28 NOTE — H&P (Signed)
 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 Patient presented to [facility] on 09/27/24 reporting increased depression and passive SI in the context of severe alcohol  use. Also noting the death of 2 close friends within the past few months. Labs notable for increased AST (81) and ALT (145) otherwise CMP and CBC WNL.BAL elevated to 128,  UDS negative. Patient was deemed medically cleared and transferred to [facility] for further care. Given severity of alcohol  use and history of alcohol  withdrawal seizure he was placed on CIWA and with librium  taper.   Psychiatric history Patient has reported normal birth and development, although reports a diagnosis of ADHD from childhood. 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 It was during this time that patient began using substances as part of the lifestyle, primarily alcohol  but also cocaine and meth. He has a cluster of 4-5 inpatient psychiatric admissions in his mid-late 30s (not visible on chart review) for mood  lability (depressions and irritability/elevations) and SI in the setting of heavy stimulant use. No known suicide attempts. He was given a number of diagnoses during this period of time, including depression, substance-induced mood disorders, possible bipolar disorder and tried on both SSRIs and antipsychotic medications. Patient reports overall no improvements on any of these medications and the experience of them making him worse (more suicidal, more erratic, etc.). He has been sober from all street drugs (stimulants) for the past 6 years. Has continued to drink alcohol  on and off, but did have a 2-year period of sobriety from both alcohol  and other substances. At that time mood was described as good with no significant anxiety and symptoms concerning for a manic episode. Over the past year and escalating within the past few months he has had significant alcohol  use of up to 2 pints daily of brandy. Reports history of withdrawal seizure in the past.   Previous medications include bupropion, a bunch of SSRIs, abilify , other antipsychotics probably, klonopin , adderall. Most recent prescriptions (within the past year) have been for the adderall and klonopin , but neither have been filled in some time.   Recent events: Today the patient states he is not good. States he has been on a downward decline since about 2020/2021 (covid time). Reports that a good friend of his was dying of cancer. He did not visit him in the hospital because their last meeting did not go well and he didn't want to stress him out as he was dying. The friend died without him saying an in-person goodbye and he states his family and friends judged him for this. He had a difficult time coping and began drinking again. Patient states at first he would not have considered it  problematic as he was still functioning, however it began to escalate over the next few years. States he started to drink even at work and in between. Was  shuffled to various positions within the company he had worked at (on and off for 25 years) but was not meeting their quota. He was ultimately fired from that position, but states they were good to him and did not mention how clearly bad his drinking was when they fired him. 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  Things worsened again a little less than a year ago in February of this year. States his best friend of many years died unexpectedly. He had wrecked his kidney and liver with kratom. Patient reports this hit him hard and he began drinking even more. This came at a time where his girlfriend/partner was not doing well. She was diagnosed with stage 4 cancer, was having falls, and often needed him at home. Between the drinking and her he was not able to work, reports selling off much of their things to help pay the rent but also acknowledges a lot of the money was going into alcohol . Within the past month patient states that they were at the end of their lease and the landlord did not let them renew it. His girlfriend's family moved her into a different apartment and he was squatting in the empty apartment. States two weeks later his girlfriend passed away and he was not there. Believes that if he had been there to take care of her she wouldn't have died and feels a lot of guilt around this and anger at her family for taking her away from him.   Patient states in these last weeks he has been drinking every moment of the day, 2 pints of straight brandy. He has not been taking care of himself and has been feeling very sad and down. Endorses poor sleep, low energy, a lot of guilt and tearfulness. He is not sure if he is depressed. Rather he feels that he is sick and sad at the situation. Having thoughts that life is not worth living like this. Today denies frank SI and does report wanting to get sober. He is understanding that alcohol   use is the main problem in his life right now and is hopeful to go to rehab. In addition to low mood symptoms he does endorse a lot of anxiety - most of it felt when not drinking and non-specific. Reports some social-based anxiety as well. Currently he is not interested in medications, especially antidepressants, but after he is through detox he is open to a medicine that might help cravings or anxiety. Briefly discussed gabapentin but patient voiced preference to hold off for now.   [place] Scale:  Flowsheet Row Ad[facility] (Current) [facility] Most recent reading at 09/27/2024  4:06 PM ED from 09/27/2024 in [facility] Emergency Department at [facility] Most recent reading at 09/27/2024  6:13 AM ED from 03/08/2024 in [facility] Emergency Department at [facility] Most recent reading at 03/08/2024  6:00 AM  C-SSRS RISK CATEGORY No Risk No Risk No Risk     Past Medical History:  Past Medical History:  Diagnosis Date   Alcoholism (HCC)    Back pain    Bipolar 1 disorder (HCC)    Depression    Tumor cells, benign 12/24/2013   Back,  with related pain and numbness.    Past Surgical History:  Procedure Laterality Date   extraction of wisdom teeth     WISDOM TOOTH EXTRACTION  1994   Family History:  Family History  Family history unknown: Yes   Family Psychiatric  History: reports son with autism Tobacco Screening:  Social History   Tobacco Use  Smoking Status Every Day   Current packs/day: 0.50   Types: Cigarettes, E-cigarettes   Start date: 07/13/2021  Smokeless Tobacco Never    BH Tobacco Counseling     Are you interested in Tobacco Cessation Medications?  No value filed. Counseled patient on smoking cessation:  Yes Reason Tobacco Screening Not Completed: No value filed.       Social History:  Social History   Substance and Sexual Activity  Alcohol  Use Yes     Social History   Substance and  Sexual Activity  Drug Use Not Currently   Types: Cocaine    Additional Social History: Recently unhoused - previously domiciled with girlfriend, who recently passed from cancer. Has been living in his car, reports unemployed currently. Drinking 2 pints daily of brandy.  Allergies:   Allergies  Allergen Reactions   Codeine Nausea Only   Lab Results:  Results for orders placed or performed during the hospital encounter of 09/27/24 (from the past 48 hours)  Comprehensive metabolic panel     Status: Abnormal   Collection Time: 09/27/24  6:38 AM  Result Value Ref Range   Sodium 139 135 - 145 mmol/L   Potassium 3.5 3.5 - 5.1 mmol/L   Chloride 103 98 - 111 mmol/L   CO2 22 22 - 32 mmol/L   Glucose, Bld 97 70 - 99 mg/dL    Comment: Glucose reference range applies only to samples taken after fasting for at least 8 hours.   BUN 8 6 - 20 mg/dL   Creatinine, Ser 9.39 (L) 0.61 - 1.24 mg/dL   Calcium 9.5 8.9 - 89.6 mg/dL   Total Protein 7.4 6.5 - 8.1 g/dL   Albumin 4.3 3.5 - 5.0 g/dL   AST 81 (H) 15 - 41 U/L   ALT 145 (H) 0 - 44 U/L   Alkaline Phosphatase 56 38 - 126 U/L   Total Bilirubin 0.3 0.0 - 1.2 mg/dL   GFR, Estimated >39 >39 mL/min    Comment: (NOTE) Calculated using the CKD-EPI Creatinine Equation (2021)    Anion gap 14 5 - 15    Comment: Performed at [facility], [address] [place], [place] [address]  cbc     Status: None   Collection Time: 09/27/24  6:38 AM  Result Value Ref Range   WBC 5.5 4.0 - 10.5 K/uL   RBC 4.78 4.22 - 5.81 MIL/uL   Hemoglobin 15.1 13.0 - 17.0 g/dL   HCT 54.6 60.9 - 47.9 %   MCV 94.8 80.0 - 100.0 fL   MCH 31.6 26.0 - 34.0 pg   MCHC 33.3 30.0 - 36.0 g/dL   RDW 86.9 88.4 - 84.4 %   Platelets 179 150 - 400 K/uL   nRBC 0.0 0.0 - 0.2 %    Comment: Performed at [facility], [address] [place], [place] [address]  Ethanol     Status: Abnormal   Collection Time: 09/27/24  6:39 AM  Result Value Ref Range    Alcohol , Ethyl (B) 128 (H) <15 mg/dL    Comment: (NOTE) For medical purposes only. Performed at [facility]  [facility], [address], [place], [place] [address]   Acetaminophen  level     Status: Abnormal   Collection Time: 09/27/24  6:39 AM  Result Value Ref Range   Acetaminophen  (Tylenol ), Serum <10 (L) 10 - 30 ug/mL    Comment: (NOTE) Toxic concentrations can be more effectively related to post dose interval; >200, >100, and >50 ug/mL serum concentrations correspond to toxic concentrations at 4, 8, and 12 hours post dose, respectively.  Performed at [facility], [address], [place], [place] [address]   Salicylate level     Status: Abnormal   Collection Time: 09/27/24  6:39 AM  Result Value Ref Range   Salicylate Lvl <7.0 (L) 7.0 - 30.0 mg/dL    Comment: Performed at [facility], [address] [place], [place] [address]  Rapid urine drug screen (hospital performed)     Status: None   Collection Time: 09/27/24  7:17 AM  Result Value Ref Range   Opiates NEGATIVE NEGATIVE   Cocaine NEGATIVE NEGATIVE   Benzodiazepines NEGATIVE NEGATIVE   Amphetamines NEGATIVE NEGATIVE   Tetrahydrocannabinol NEGATIVE NEGATIVE   Barbiturates NEGATIVE NEGATIVE   Methadone Scn, Ur NEGATIVE NEGATIVE   Fentanyl NEGATIVE NEGATIVE    Comment: (NOTE) Drug screen is for Medical Purposes only. Positive results are preliminary only. If confirmation is needed, notify lab within 5 days.  Drug Class                 Cutoff (ng/mL) Amphetamine  and metabolites 1000 Barbiturate and metabolites 200 Benzodiazepine              200 Opiates and metabolites     300 Cocaine and metabolites     300 THC                         50 Fentanyl                    5 Methadone                   300  Trazodone  is metabolized in vivo to several metabolites,  including pharmacologically active m-CPP, which is excreted in the  urine.  Immunoassay screens for  amphetamines and MDMA have potential  cross-reactivity with these compounds and may provide false positive  result.  Performed at [facility], [address], [place], [place] [address]     Blood Alcohol  level:  Lab Results  Component Value Date   ETH 128 (H) 09/27/2024   ETH 111 (H) 03/24/2015    Metabolic Disorder Labs:  Lab Results  Component Value Date   HGBA1C 5.3 03/11/2020   No results found for: PROLACTIN Lab Results  Component Value Date   CHOL 180 03/11/2020   TRIG 84 03/11/2020   HDL 57 03/11/2020   CHOLHDL 3.2 03/11/2020   LDLCALC 108 (H) 03/11/2020    Current Medications: Current Facility-Administered Medications  Medication Dose Route Frequency Provider Last Rate Last Admin   alum & mag hydroxide-simeth (MAALOX/MYLANTA) 200-200-20 MG/5ML suspension 30 mL  30 mL Oral Q4H PRN Mills, Shnese E, NP       chlordiazePOXIDE  (LIBRIUM ) capsule 25 mg  25 mg Oral Q6H PRN Mills, Shnese E, NP       chlordiazePOXIDE  (LIBRIUM ) capsule 25 mg  25 mg Oral TID Moishe Bernadette BRAVO, NP       Followed by   NOREEN ON 09/29/2024] chlordiazePOXIDE  (  LIBRIUM ) capsule 25 mg  25 mg Oral BH-qamhs Moishe Burton E, NP       Followed by   NOREEN ON 10/01/2024] chlordiazePOXIDE  (LIBRIUM ) capsule 25 mg  25 mg Oral Daily Mills, Shnese E, NP       cloNIDine (CATAPRES) tablet 0.1 mg  0.1 mg Oral TID PRN Crue Otero N, MD       haloperidol (HALDOL) tablet 5 mg  5 mg Oral TID PRN Mills, Shnese E, NP       And   diphenhydrAMINE (BENADRYL) capsule 50 mg  50 mg Oral TID PRN Mills, Shnese E, NP       haloperidol lactate (HALDOL) injection 5 mg  5 mg Intramuscular TID PRN Mills, Shnese E, NP       And   diphenhydrAMINE (BENADRYL) injection 50 mg  50 mg Intramuscular TID PRN Mills, Shnese E, NP       And   LORazepam  (ATIVAN ) injection 2 mg  2 mg Intramuscular TID PRN Mills, Shnese E, NP       haloperidol lactate (HALDOL) injection 10 mg  10 mg Intramuscular TID PRN Mills, Shnese  E, NP       And   diphenhydrAMINE (BENADRYL) injection 50 mg  50 mg Intramuscular TID PRN Moishe Burton E, NP       And   LORazepam  (ATIVAN ) injection 2 mg  2 mg Intramuscular TID PRN Mills, Shnese E, NP       FLUoxetine (PROZAC) capsule 10 mg  10 mg Oral Daily Mills, Shnese E, NP       gabapentin (NEURONTIN) capsule 300 mg  300 mg Oral TID Mills, Shnese E, NP   300 mg at 09/28/24 1207   hydrOXYzine  (ATARAX ) tablet 25 mg  25 mg Oral Q6H PRN Mills, Shnese E, NP   25 mg at 09/27/24 2110   ibuprofen  (ADVIL ) tablet 400 mg  400 mg Oral Q6H PRN Mills, Shnese E, NP       loperamide  (IMODIUM ) capsule 2-4 mg  2-4 mg Oral PRN Mills, Shnese E, NP       magnesium  hydroxide (MILK OF MAGNESIA) suspension 30 mL  30 mL Oral Daily PRN Mills, Shnese E, NP       multivitamin with minerals tablet 1 tablet  1 tablet Oral Daily Moishe Burton E, NP   1 tablet at 09/28/24 0746   nicotine  (NICODERM CQ  - dosed in mg/24 hours) patch 21 mg  21 mg Transdermal Daily Mills, Shnese E, NP   21 mg at 09/28/24 0746   ondansetron  (ZOFRAN -ODT) disintegrating tablet 4 mg  4 mg Oral Q6H PRN Mills, Shnese E, NP       PTA Medications: No medications prior to admission.    Physical findings:  Mental Status exam: Appearance: white male, tall, slightly elevated BMI, seen appropriately groomed in grey shirt, does appear ill-appearing and somewhat tremulous Eye contact: good  Attitude towards examiner cooperative, pleasant  Psychomotor: mild tremulousness, no agitation or retardation  Speech: normal in rate, rhythm and prosody, increased amount  Language: no delays  Mood: not good  Affect: congruent, appeared ill (withdrawing) and dysphoric at times  Thought content: denying SI and HI, no delusions expressed  Thought Process: linear, organized and goal-directed  Perception: denying AVH, not RTIS  Insight: good  Judgement: limited   Orientation: x3 Attention/Concentration: good - attends to interview  Memory/Cognition:  grossly intact on conversation   Fund of Knowledge: Average    Musculoskeletal: Strength & Muscle Tone:  within normal limits Gait & Station: normal Patient leans: N/A    Physical Exam Constitutional:      Appearance: He is ill-appearing.  Pulmonary:     Effort: Pulmonary effort is normal.  Abdominal:     General: There is no distension.  Musculoskeletal:        General: Normal range of motion.  Neurological:     General: No focal deficit present.     Mental Status: He is alert.    ROS Blood pressure (!) 148/101, pulse 73, temperature 97.6 F (36.4 C), temperature source Oral, resp. rate 18, height 5' 10 (1.778 m), weight 85.3 kg, SpO2 100%. Body mass index is 26.98 kg/m.  Treatment Plan Summary: Daily contact with patient to assess and evaluate symptoms and progress in treatment  Assessment: The patient is a 48 y.o. 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 He presents with depressive symptoms including low mood, passive SI, reduced sleep, reduced energy, feelings of guilt/worthlessness. This is in the context of significant losses (girlfriend 2 weeks ago, best friend in the past year) as well as severe alcohol  use. Although patient has been given a number of affective diagnoses in the past (including both depression and bipolar disorder) this was all in the context of heavy stimulant usage (meth and cocaine). He has not used any stimulants in the last 6 years and this is considered to be in sustained remission. He does clearly meet criteria for severe alcohol  use disorder given tolerance, withdrawal, over-spending of money on alcohol  with subsequent loss of housing, significant impairments in social and occupational functioning (estranged from family, fired from his job) and  worsening of mental and physical health (SI during intoxication, increased LFTs). During periods of sobriety he has not experienced any symptoms consistent with depression or mania. Given this, best working diagnosis is a substance-induced depressive disorder (alcohol ). He carries ADHD by history and may additional have an underlying anxiety disorder. Anxiety symptoms are vague and currently appear to be more related to alchohol withdrawal, although he has noted some social anxiety in the past. Best described as unspecified at this time.  Today the patient was linear and organized in thought process with no active signs of mania or psychosis. Was tearful, congruent with reported low mood symptoms, although was not reporting SI today. Overall was expressing good insight into severity of alcohol  use as being the main driving factor for problems in his life currently. Wanting to safely get through detox and ultimately secure inpatient rehab. Librium  taper will be continued given reports of prior withdrawal seizure and severity of use. Once patient is through withdrawal, would consider addition of gabapentin to address anxiety, pain and cravings. Currently, patient voiced preference against medications. Specifically, he does not want any SSRIs or wellbutrin due to poor response in the past, but is open to a medication to help with cravings.   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 Mood symptoms  will likely clear with sobriety alone given history as noted above.    -Consider gabapentin once withdrawal symptoms improve to address pain, anxiety and cravings   -PRN atarax  for anxiety -PRN trazodone  for sleep -PRN haldol/ativan /benadryl for agitation  Substance use concerns  Severe alcohol  use disorder -Continue librium  taper: 25 mg QID yesterday, TID today, BID tomorrow, and last dose on Wednesday -CIWA -multivitamin -PRN clonidine for HTN: systolic >150 or diastolic >100 -ondansetron  PRN for nausea -loperamide  2-4 mg for loose stools   Nicotine  Replacement  21 mg Patch and gum available   Medical concerns -Patient reports neuropathy;   -no scheduled medications currently. As above, could consider gabapentin  Additional PRNs: -Tylenol  tablets 650 mg every 6 hours as needed for pain -Maalox/Mylanta suspension 30 mL every 4 hours as needed for indigestion  -Milk of Magnesia 30 mL daily as needed for constipation  Labs -Reviewed as documented in HPI; elevated LFTs otherwise grossly WNL  Psychosocial interventions  -Motivational interviewing  -daily medication management with psychiatry -Medication education regarding risks/benefits and alternatives -bedside psychotherapy as indicated  -Patient will be encouraged to participate and engage with group therapy  -Appreciate SW assistance in coordinating safe disposition    I certify that inpatient services furnished can reasonably be expected to improve the patient's condition.    Leita LOISE Arts, MD 11/17/202512:44 PM

## 2024-09-28 NOTE — Group Note (Signed)
 Date:  09/28/2024 Time:  11:24 AM  Group Topic/Focus: Emotional wellness Emotional Education:   The focus of this group is to discuss what feelings/emotions are, and how they are experienced.    Participation Level:  Did Not Attend   Jonathan Lin 09/28/2024, 11:24 AM

## 2024-09-28 NOTE — Group Note (Signed)
 Date:  09/28/2024 Time:  2:04 PM  Group Topic/Focus: [place] Spirituality:   The focus of this group is to discuss how one's spirituality can aide in recovery.    Participation Level:  Did Not Attend   Jonathan Lin 09/28/2024, 2:04 PM

## 2024-09-29 ENCOUNTER — Encounter (HOSPITAL_COMMUNITY): Payer: Self-pay | Admitting: Adult Health

## 2024-09-29 DIAGNOSIS — F419 Anxiety disorder, unspecified: Secondary | ICD-10-CM

## 2024-09-29 DIAGNOSIS — F10229 Alcohol dependence with intoxication, unspecified: Secondary | ICD-10-CM

## 2024-09-29 DIAGNOSIS — F1024 Alcohol dependence with alcohol-induced mood disorder: Principal | ICD-10-CM

## 2024-09-29 DIAGNOSIS — F909 Attention-deficit hyperactivity disorder, unspecified type: Secondary | ICD-10-CM

## 2024-09-29 MED ORDER — CHLORDIAZEPOXIDE HCL 25 MG PO CAPS: 25.0000 mg | ORAL_CAPSULE | Freq: Once | ORAL | 0 refills | Status: AC

## 2024-09-29 MED ORDER — HYDROXYZINE HCL 25 MG PO TABS: 25.0000 mg | ORAL_TABLET | Freq: Four times a day (QID) | ORAL | 0 refills | Status: AC | PRN

## 2024-09-29 MED ORDER — GABAPENTIN 300 MG PO CAPS
300.0000 mg | ORAL_CAPSULE | Freq: Three times a day (TID) | ORAL | 0 refills | Status: AC
Start: 2024-09-29 — End: ?

## 2024-09-29 MED ORDER — NICOTINE 21 MG/24HR TD PT24: 21.0000 mg | MEDICATED_PATCH | Freq: Every day | TRANSDERMAL | 0 refills | Status: AC

## 2024-09-29 NOTE — Progress Notes (Addendum)
  [facility] Adult Case Management Discharge Plan :  Will you be returning to the same living situation after discharge:  No. Pt will reside with a friend.  At discharge, do you have transportation home?: Yes,  Pt will be picked up by Devere (friend) at 230PM (SAME DAY DISCHARGE) Do you have the ability to pay for your medications: No. Samples requested prior to discharge  Release of information consent forms completed and in the chart;  Patient's signature needed at discharge.  Patient to Follow up at:  Follow-up Information     [facility] Call.   Specialty: Addiction Medicine Why: A referral has been made on your behalf to this facility for substance use treatment. If interested, you may call to complete your phone screen for admission as they already have your information. Contact information: [address] [place] [address] [phone]         [facility] Call.   Why: A referral has been made on your behalf to this facility for substance use treatment. If interested, you may call to discuss admission as they already have your information. Contact information: [address] [place] [address] [phone]         [facility] Follow up on 10/01/2024.   Specialty: Professional Counselor Why: Please go to this provider on 10/01/24 at 9:00 am for an assessment, if you wish to obtain therapy services. Contact information: [address]  [address] [place] [address] [phone]         [facility]. Go on 10/06/2024.   Specialty: Behavioral Health Why: Please go to this provider on 10/06/24 at 7:00 am for an assessment, if you wish to obtain medication management services. Contact information: [address] [place]  [id number] [phone]                Next level of care provider has access to [facility] Link:no  Safety Planning and  Suicide Prevention discussed: Yes,  Devere Brunt [phone]     Has patient been referred to the Quitline?: Patient refused referral for treatment  Patient has been referred for addiction treatment: Referrals sent to [facility] and [facility], pt able to follow up if interested post discharge. Did not want door-to-door treatment.   Jenkins LULLA Primer, LCSWA 09/29/2024, 1:12 PM

## 2024-09-29 NOTE — [facility] Suicide Risk Assessment (Signed)
 [facility] INPATIENT:  Family/Significant Other Suicide Prevention Education  Suicide Prevention Education:  Contact Attempts: [name] [phone] ,  has been identified by the patient as the family member/significant other with whom the patient will be residing, and identified as the person(s) who will aid the patient in the event of a mental health crisis.  With written consent from the patient, two attempts were made to provide suicide prevention education, prior to and/or following the patient's discharge.  We were unsuccessful in providing suicide prevention education.  A suicide education pamphlet was given to the patient to share with family/significant other.  Date and time of first attempt: 09/29/24 at 12:54 pm   Jonathan Lin 09/29/2024, 12:54 PM

## 2024-09-29 NOTE — [facility] Suicide Risk Assessment (Signed)
 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  During the hospitalization, other adjustments were made to the patient's psychiatric medication regimen: None  Gradually, patient started adjusting to milieu.   Patient's care was discussed during the interdisciplinary team meeting every day during the hospitalization.  The patient is not having side effects to prescribed psychiatric medication.  The patient reports their target psychiatric symptoms of withdrawal and sadness responded well to the psychiatric medications, and the patient reports overall benefit other psychiatric hospitalization. Supportive psychotherapy was provided to the patient. The patient also participated in regular group therapy while admitted.   Labs were reviewed with the patient, and abnormal results were discussed with the patient.  The patient denied having suicidal thoughts more than 48 hours prior to discharge.  Patient denies having homicidal thoughts.  Patient denies having auditory hallucinations.  Patient denies any visual  hallucinations.  Patient denies having paranoid thoughts.  The patient is able to verbalize their individual safety plan to this provider.  It is recommended to the patient to continue psychiatric medications as prescribed, after discharge from the hospital.    It is recommended to the patient to follow up with your outpatient psychiatric provider and PCP.  Discussed with the patient, the impact of alcohol , drugs, tobacco have been there overall psychiatric and medical wellbeing, and total abstinence from substance use was recommended the patient.  Total Time spent with patient: 45 minutes  Musculoskeletal: Strength & Muscle Tone: within normal limits Gait & Station: normal Patient leans: N/A  Psychiatric Specialty Exam  Presentation  General Appearance:  Appropriate for Environment; Casual  Eye Contact: Fair  Speech: Clear and Coherent; Normal Rate  Speech Volume: Normal  Handedness:No data recorded  Mood and Affect  Mood: -- (ok)  Duration of Depression Symptoms: No data recorded Affect: Congruent; Appropriate   Thought Process  Thought Processes: Coherent; Goal Directed  Descriptions of Associations:Intact  Orientation:Full (Time, Place and Person)  Thought Content:Logical; WDL  History of Schizophrenia/Schizoaffective disorder:No data recorded Duration of Psychotic Symptoms:No data recorded Hallucinations:Hallucinations: None  Ideas of Reference:None  Suicidal Thoughts:Suicidal Thoughts: No  Homicidal Thoughts:Homicidal Thoughts: No   Sensorium  Memory: Immediate Good; Recent Good  Judgment: Fair  Insight: Fair   Art Therapist  Concentration: Good  Attention Span: Good  Recall: Good  Fund of Knowledge: Good  Language: Good   Psychomotor Activity  Psychomotor Activity: Psychomotor Activity: Normal   Assets  Assets: Resilience; Desire for Improvement; Communication Skills; Social Support   Sleep   Sleep: Sleep: Good  Estimated Sleeping Duration (Last 24 Hours): 6.25-7.50 hours (Due to Daylight Saving Time, the durations displayed may not accurately represent documentation during the time change interval)  Physical Exam: Physical Exam Vitals and nursing note reviewed.  Constitutional:      General: He  is not in acute distress.    Appearance: Normal appearance. He is normal weight. He is not ill-appearing or toxic-appearing.  HENT:     Head: Normocephalic and atraumatic.  Pulmonary:     Effort: Pulmonary effort is normal.  Musculoskeletal:        General: Normal range of motion.  Neurological:     General: No focal deficit present.     Mental Status: He is alert.    Review of Systems  Respiratory:  Negative for cough and shortness of breath.   Cardiovascular:  Negative for chest pain.  Gastrointestinal:  Negative for abdominal pain, constipation, diarrhea, nausea and vomiting.  Neurological:  Positive for tremors (mild). Negative for dizziness, weakness and headaches.  Psychiatric/Behavioral:  Negative for depression, hallucinations and suicidal ideas. The patient is not nervous/anxious.    Blood pressure 121/82, pulse 64, temperature 98.2 F (36.8 C), temperature source Oral, resp. rate 16, height 5' 10 (1.778 m), weight 85.3 kg, SpO2 100%. Body mass index is 26.98 kg/m.  Mental Status Per Nursing Assessment::   On Admission:  NA  Demographic Factors:  Male, Caucasian, Low socioeconomic status, and Unemployed  Loss Factors: Loss of significant relationship  Historical Factors: Anniversary of important loss  Risk Reduction Factors:   Responsible for children under 71 years of age, Living with another person, especially a relative, and Positive therapeutic relationship  Continued Clinical Symptoms:  Previous Psychiatric Diagnoses and Treatments Medical Diagnoses and Treatments/Surgeries  Cognitive Features That Contribute To Risk:  None    Suicide Risk:   Minimal: No identifiable suicidal ideation.  Patients presenting with no risk factors but with morbid ruminations; may be classified as minimal risk based on the severity of the depressive symptoms   Follow-up Information     [facility] Follow up.   Specialty: Addiction Medicine Why: Referral made Contact information: [address] [place] [address] [phone]         Services, [facility] Follow up.   Why: Referral made Contact information: [address] [place] [address] [phone]                 Plan Of Care/Follow-up recommendations:  Activity: as tolerated  Diet: heart healthy  Other: -Follow-up with your outpatient psychiatric provider -instructions on appointment date, time, and address (location) are provided to you in discharge paperwork.  -Take your psychiatric medications as prescribed at discharge - instructions are provided to you in the discharge paperwork  -Follow-up with outpatient primary care doctor and other specialists -for management of chronic medical disease, including: Routine Care, Continued care for Substance Use.  -Testing: Follow-up with outpatient provider for abnormal lab results: None  -Recommend abstinence from alcohol , tobacco, and other illicit drug use at discharge.   -If your psychiatric symptoms recur, worsen, or if you have side effects to your psychiatric medications, call your outpatient psychiatric provider, 911, [phone] or go to the nearest emergency department.  -If suicidal thoughts recur, call your outpatient psychiatric provider, 911, [phone] or go to the nearest emergency department.   Marsa GORMAN Rosser, DO 09/29/2024, 12:58 PM

## 2024-09-29 NOTE — [facility] Suicide Risk Assessment (Signed)
 [facility] INPATIENT:  Family/Significant Other Suicide Prevention Education  Suicide Prevention Education:  Education Completed; Devere Brunt [phone] ,  (name of family member/significant other) has been identified by the patient as the family member/significant other with whom the patient will be residing, and identified as the person(s) who will aid the patient in the event of a mental health crisis (suicidal ideations/suicide attempt).  With written consent from the patient, the family member/significant other has been provided the following suicide prevention education, prior to the and/or following the discharge of the patient.  No safety concerns with pt discharging there today. Devere will pick pt up this afternoon at 2:30 PM. No firearms in the home, able to continue to support pt post discharge.  The suicide prevention education provided includes the following: Suicide risk factors Suicide prevention and interventions National Suicide Hotline telephone number [facility] assessment telephone number [facility] Emergency Assistance 911 [facility] and/or Residential Mobile Crisis Unit telephone number  Request made of family/significant other to: Remove weapons (e.g., guns, rifles, knives), all items previously/currently identified as safety concern.   Remove drugs/medications (over-the-counter, prescriptions, illicit drugs), all items previously/currently identified as a safety concern.  The family member/significant other verbalizes understanding of the suicide prevention education information provided.  The family member/significant other agrees to remove the items of safety concern listed above.  Jenkins LULLA Primer 09/29/2024, 1:07 PM

## 2024-09-29 NOTE — Progress Notes (Signed)
(  Sleep Hours) -8.25 as of 0530 (Any PRNs that were needed, meds refused, or side effects to meds)- prn hydroxyzine  @ 2046 (Any disturbances and when (visitation, over night)-none (Concerns raised by the patient)- none (SI/HI/AVH)- denies all

## 2024-09-29 NOTE — Group Note (Signed)
 LCSW Group Therapy Note   Group Date: 09/29/2024 Start Time: 1100 End Time: 1200   Participation:  did not attend  Type of Therapy:  Group Therapy  Topic:  Shining from Within:  Confidence and Self-Love Journey   Objective:  To support participants in developing confidence and self-love through self-awareness, self-compassion, and practical skills that nurture personal growth.   Group Goals Encourage self-reflection and self-acceptance by identifying personal strengths and achievements. Teach skills to challenge negative self-talk and replace it with supportive, truthful self-talk. Foster resilience and self-worth through owens & minor, gratitude, and self-care practices.   Summary:  This group explores the connection between confidence and self-love by guiding participants through reflection, mindset shifts, and practical tools like affirmations, strength recognition, and goal-setting. Activities are designed to promote self-compassion, build emotional resilience, and normalize the slow, patient journey of inner growth.   Therapeutic Modalities Used Cognitive Behavioral Therapy (CBT): Challenging and reframing unhelpful self-talk. Motivational Interviewing (MI): Encouraging small, achievable goals. Elements of Dialectical Behavioral Therapist (DBT):  Mindfulness and Self-Compassion: Promoting present-moment awareness and kindness toward self.   Nusayba Cadenas O Caramia Boutin, LCSWA 09/29/2024  12:23 PM

## 2024-09-29 NOTE — Progress Notes (Signed)
 Patient verbalizes readiness for discharge. All patient belongings returned to patient. Discharge instructions read and discussed with patient (appointments, medications, resources). Patient expressed gratitude for care provided. Patient discharged to lobby at 1430 where his ride was waiting.

## 2024-09-29 NOTE — Group Note (Signed)
 Date:  09/29/2024 Time:  12:16 PM  Group Topic/Focus: Social work  Social work helps clients with low confidence by assessing challenges, empowering them through skills and support, fostering self-esteem, and helping them navigate social and printmaker. Confidence is both a goal and a tool for mental health recovery.    Participation Level:  Did Not Attend   Jonathan Lin 09/29/2024, 12:16 PM

## 2024-09-29 NOTE — Group Note (Signed)
 Date:  09/29/2024 Time:  10:06 AM  Group Topic/Focus: Coping and goals group  Goals Group:   The focus of this group is to help patients establish daily goals to achieve during treatment and discuss how the patient can incorporate goal setting into their daily lives to aide in recovery. Coping skills for patients (more respectfully called people living with mental health conditions) are tools, strategies, or behaviors that help a person manage stress, difficult emotions, symptoms, and challenging situations.   Participation Level:  Did Not Attend  Jonathan Lin 09/29/2024, 10:06 AM

## 2024-09-29 NOTE — Discharge Summary (Signed)
 Physician Discharge Summary Note  Patient:  Jonathan Lin is an 48 y.o., male MRN:  [id number] DOB:  [birth year] Patient phone:  [phone] (home)  Patient address:   [name]  [id number],  Total Time spent with patient: 45 minutes  Date of Admission:  09/27/2024 Date of Discharge: 09/29/2024  Reason for Admission:   The patient is a 48 y.o. 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 Patient presented to [facility] on 09/27/24 reporting increased depression and passive SI in the context of severe alcohol  use. Also noting the death of 2 close friends within the past few months. Labs notable for increased AST (81) and ALT (145) otherwise CMP and CBC WNL.BAL elevated to 128,  UDS negative. Patient was deemed medically cleared and transferred to [facility] for further care. Given severity of alcohol  use and history of alcohol  withdrawal seizure he was placed on CIWA and with librium  taper.   Patient states in these last weeks he has been drinking every moment of the day, 2 pints of straight brandy. He has not been taking care of himself and has been feeling very sad and down. Endorses poor sleep, low energy, a lot of guilt and tearfulness. He is not sure if he is depressed. Rather he feels that he is sick and sad at the situation. Having thoughts that life is not worth living like this. Today denies frank SI and does report wanting to get sober. He is understanding that alcohol  use is the main problem in his life right now and is hopeful to go to rehab. In addition to low mood symptoms he does endorse a lot of anxiety - most of it felt when not drinking and non-specific. Reports some social-based anxiety as well.  Currently he is not interested in medications, especially antidepressants, but after he is through detox he is open to a medicine that might help cravings or anxiety. Briefly discussed gabapentin but patient voiced preference to hold off for now.   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 It was during this time that patient began using substances as part of the lifestyle, primarily alcohol  but also cocaine and meth. He has a cluster of 4-5 inpatient psychiatric admissions in his mid-late 30s (not visible on chart review) for mood lability (depressions and irritability/elevations) and SI in the setting of heavy stimulant use. No known suicide attempts. He was given a number of diagnoses during this period of time, including depression, substance-induced mood disorders, possible bipolar disorder and tried on both SSRIs and antipsychotic medications. Patient reports overall no improvements on any of these medications and the experience of them making him worse (more suicidal, more erratic, etc.). He has been sober from all street drugs (stimulants) for the past 6 years. Has continued  to drink alcohol  on and off, but did have a 2-year period of sobriety from both alcohol  and other substances. At that time mood was described as good with no significant anxiety and symptoms concerning for a manic episode.  Over the past year and escalating within the past few months he has had significant alcohol  use of up to 2 pints daily of brandy. Reports history of withdrawal seizure in the past.   Past Medical History:  Past Medical History:  Diagnosis Date   Alcoholism (HCC)    Back pain    Bipolar 1 disorder (HCC)    Depression    Tumor cells, benign 12/24/2013   Back, with related pain and numbness.    Past Surgical History:  Procedure Laterality Date   extraction of wisdom teeth     WISDOM TOOTH EXTRACTION  1994   Family History:  Family History  Family history unknown: Yes   Family Psychiatric  History:  Son- Autism  Social History:  Social History   Substance and Sexual Activity  Alcohol  Use Yes     Social History   Substance and Sexual Activity  Drug Use Not Currently   Types: Cocaine    Social History   Socioeconomic History   Marital status: Legally Separated    Spouse name: Not on file   Number of children: Not on file   Years of education: Not on file   Highest education level: Not on file  Occupational History   Not on file  Tobacco Use   Smoking status: Every Day    Current packs/day: 0.50    Types: Cigarettes, E-cigarettes    Start date: 07/13/2021   Smokeless tobacco: Never  Vaping Use   Vaping status: Every Day   Substances: Nicotine , Homemade substance  Substance and Sexual Activity   Alcohol  use: Yes   Drug use: Not Currently    Types: Cocaine   Sexual activity: Not Currently    Birth control/protection: Condom  Other Topics Concern   Not on file  Social History Narrative   Not on file   Social Drivers of Health   Financial Resource Strain: Not on file  Food Insecurity: Patient Declined (09/27/2024)   Hunger Vital Sign    Worried About Running Out of Food in the Last Year: Patient declined    Ran Out of Food in the Last Year: Patient declined  Transportation Needs: No Transportation Needs (09/27/2024)   PRAPARE - Scientist, Research (physical Sciences) (Medical): No    Lack of Transportation (Non-Medical): No  Physical Activity: Not on file  Stress: Not on file  Social Connections: Patient Declined (09/27/2024)   Social Connection and Isolation Panel    Frequency of Communication with Friends and Family: Patient declined    Frequency of Social Gatherings with Friends and Family: Patient declined    Attends Religious Services: Patient declined    Database Administrator or Organizations: Patient declined    Attends Banker Meetings: Patient declined    Marital Status: Patient declined    Hospital Course:   During the patient's hospitalization, patient had extensive initial psychiatric evaluation, and follow-up psychiatric evaluations every day.  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  During the hospitalization, other adjustments were made to the patient's psychiatric medication regimen: None   Patient's care was discussed during the interdisciplinary team meeting every day during the hospitalization.  The patient is not having side effects to prescribed psychiatric medication.  Gradually, patient started adjusting to milieu. The patient was evaluated each day by a clinical provider to ascertain response to treatment. Improvement was noted by the patient's report of decreasing symptoms, improved sleep and appetite, affect, medication tolerance, behavior, and participation in unit programming.  Patient was asked each day to complete a self inventory noting mood, mental status, pain, new symptoms, anxiety and concerns.   Symptoms were reported as significantly decreased or  resolved completely by discharge.  The patient reports that their mood is stable.  The patient denied having suicidal thoughts for more than 48 hours prior to discharge.  Patient denies having homicidal thoughts.  Patient denies having auditory hallucinations.  Patient denies any visual hallucinations or other symptoms of psychosis.  The patient was motivated to continue taking medication with a goal of continued improvement in mental health.   The patient reports their target psychiatric symptoms of withdrawal and sadness responded well to the psychiatric medications, and the patient reports overall benefit other psychiatric hospitalization. Supportive psychotherapy was provided to the patient. The patient also participated in regular group therapy while hospitalized. Coping skills, problem solving as well as relaxation therapies were also part of the unit programming.  Labs were reviewed with the patient, and abnormal results were discussed with the patient.  The patient is able to verbalize their individual safety plan to this provider.  # It is recommended to the patient to continue psychiatric medications as prescribed, after discharge from the hospital.    # It is recommended to the patient to follow up with your outpatient psychiatric provider and PCP.  # It was discussed with the patient, the impact of alcohol , drugs, tobacco have been there overall psychiatric and medical wellbeing, and total abstinence from substance use was recommended the patient.ed.  # Prescriptions provided or sent directly to preferred pharmacy at discharge. Patient agreeable to plan. Given opportunity to ask questions. Appears to feel comfortable with discharge.    # In the event of worsening symptoms, the patient is instructed to call the crisis hotline, 911 and or go to the nearest ED for appropriate evaluation and treatment of symptoms. To follow-up with primary care provider for other medical issues, concerns  and or health care needs  # Patient was discharged home with a plan to follow up as noted below.    On day of discharge he reports that he is feeling better.  Initially we had discussed staying here and continuing with the full Librium  taper, however, later in the morning he learned that his car was going to be towed from his apartment complex.  He reports that if this happens this will significantly set him back and so he requested discharge.  Discussed with him that discharging early did put him at increased risk of relapse.  Discussed with him that we would have to shorten the Librium  taper and he would only be given a single dose for tomorrow.  Discussed with him that shortening the Librium  taper like this could cause withdrawal symptoms to return/worsen up to and including withdrawal seizures.  He reports that he understands these risks and accepts them but would still like to discharge today.  He reports that he has had time to rest and process his thoughts.  He reports he is determined more than ever to maintain his sobriety to get his son back in his life.  He reports that the friend he is staying with will support his sobriety and help him to maintain it.  He reports no side effects to his medications.  He reports his sleep is good.  He reports his appetite is doing good.  He reports no SI, HI, or AVH.  Discussed with him the importance of taking his medications as prescribed and attending his follow up appointments and he reported understanding.  Discussed with him what to do in the event of a future crisis.  Discussed that he can go to [facility], go to the nearest ED, or call 911 or [phone].   He reported understanding and had no concerns.  He was discharged home with his friend Devere.   Physical Findings: AIMS:  , ,  ,  ,  ,  ,   CIWA:  CIWA-Ar Total: 1 COWS:     Musculoskeletal: Strength & Muscle Tone: within normal limits Gait & Station: normal Patient leans: N/A   Psychiatric Specialty  Exam:  Presentation  General Appearance:  Appropriate for Environment; Casual  Eye Contact: Fair  Speech: Clear and Coherent; Normal Rate  Speech Volume: Normal  Handedness:No data recorded  Mood and Affect  Mood: -- (ok)  Affect: Congruent; Appropriate   Thought Process  Thought Processes: Coherent; Goal Directed  Descriptions of Associations:Intact  Orientation:Full (Time, Place and Person)  Thought Content:Logical; WDL  History of Schizophrenia/Schizoaffective disorder:No data recorded Duration of Psychotic Symptoms:No data recorded Hallucinations:Hallucinations: None  Ideas of Reference:None  Suicidal Thoughts:Suicidal Thoughts: No  Homicidal Thoughts:Homicidal Thoughts: No   Sensorium  Memory: Immediate Good; Recent Good  Judgment: Fair  Insight: Fair   Art Therapist  Concentration: Good  Attention Span: Good  Recall: Good  Fund of Knowledge: Good  Language: Good   Psychomotor Activity  Psychomotor Activity: Psychomotor Activity: Normal   Assets  Assets: Resilience; Desire for Improvement; Communication Skills; Social Support   Sleep  Sleep: Sleep: Good  Estimated Sleeping Duration (Last 24 Hours): 6.25-7.50 hours (Due to Daylight Saving Time, the durations displayed may not accurately represent documentation during the time change interval)   Physical Exam: Physical Exam Vitals and nursing note reviewed.  Constitutional:      General: He is not in acute distress.    Appearance: Normal appearance. He is normal weight. He is not ill-appearing or toxic-appearing.  HENT:     Head: Normocephalic and atraumatic.  Pulmonary:     Effort: Pulmonary effort is normal.  Musculoskeletal:        General: Normal range of motion.  Neurological:     General: No focal deficit present.     Mental Status: He is alert.    Review of Systems  Respiratory:  Negative for cough and shortness of breath.    Cardiovascular:  Negative for chest pain.  Gastrointestinal:  Negative for abdominal pain, constipation, diarrhea, nausea and vomiting.  Neurological:  Positive for tremors (mild). Negative for dizziness, weakness and headaches.  Psychiatric/Behavioral:  Negative for depression, hallucinations and suicidal ideas. The patient is not nervous/anxious.    Blood pressure 121/82, pulse 64, temperature 98.2 F (36.8 C), temperature source Oral, resp. rate 16, height 5' 10 (1.778 m), weight 85.3 kg, SpO2 100%. Body mass index is 26.98 kg/m.   Social History   Tobacco Use  Smoking Status Every Day   Current packs/day: 0.50   Types: Cigarettes, E-cigarettes  Start date: 07/13/2021  Smokeless Tobacco Never   Tobacco Cessation:  A prescription for an FDA-approved tobacco cessation medication provided at discharge   Blood Alcohol  level:  Lab Results  Component Value Date   ETH 128 (H) 09/27/2024   ETH 111 (H) 03/24/2015    Metabolic Disorder Labs:  Lab Results  Component Value Date   HGBA1C 5.3 03/11/2020   No results found for: PROLACTIN Lab Results  Component Value Date   CHOL 180 03/11/2020   TRIG 84 03/11/2020   HDL 57 03/11/2020   CHOLHDL 3.2 03/11/2020   LDLCALC 108 (H) 03/11/2020    See Psychiatric Specialty Exam and Suicide Risk Assessment completed by Attending Physician prior to discharge.  Discharge destination:  Home  Is patient on multiple antipsychotic therapies at discharge:  No   Has Patient had three or more failed trials of antipsychotic monotherapy by history:  No  Recommended Plan for Multiple Antipsychotic Therapies: NA   Allergies as of 09/29/2024       Reactions   Codeine Nausea Only        Medication List     TAKE these medications      Indication  chlordiazePOXIDE  25 MG capsule Commonly known as: LIBRIUM  Take 1 capsule (25 mg total) by mouth once for 1 dose. Start taking on: September 30, 2024  Indication: Alcohol  Withdrawal  Syndrome   gabapentin 300 MG capsule Commonly known as: NEURONTIN Take 1 capsule (300 mg total) by mouth 3 (three) times daily.  Indication: Abuse or Misuse of Alcohol , Alcohol  Withdrawal Syndrome, Generalized Anxiety Disorder   hydrOXYzine  25 MG tablet Commonly known as: ATARAX  Take 1 tablet (25 mg total) by mouth every 6 (six) hours as needed for anxiety (or CIWA score </= 10).  Indication: Feeling Anxious   nicotine  21 mg/24hr patch Commonly known as: NICODERM CQ  - dosed in mg/24 hours Place 1 patch (21 mg total) onto the skin daily. Start taking on: September 30, 2024  Indication: Nicotine  Addiction        Follow-up Information     [facility] Call.   Specialty: Addiction Medicine Why: A referral has been made on your behalf to this facility for substance use treatment. If interested, you may call to complete your phone screen for admission as they already have your information. Contact information: [address] [place] [address] [phone]         [facility] Call.   Why: A referral has been made on your behalf to this facility for substance use treatment. If interested, you may call to discuss admission as they already have your information. Contact information: [address] [place] [address] [phone]         [facility] Follow up on 10/01/2024.   Specialty: Professional Counselor Why: Please go to this provider on 10/01/24 at 9:00 am for an assessment, if you wish to obtain therapy services. Contact information: [address]  [address] [place] [address] [phone]         [facility]. Go on 10/06/2024.   Specialty: Behavioral Health Why: Please go to this provider on 10/06/24 at 7:00 am for an assessment, if you wish to obtain medication management services. Contact information: [address] [place]   [address] [phone]                Follow-up recommendations/Comments:   Activity: as tolerated   Diet: heart healthy  Other: -Follow-up with your outpatient psychiatric provider -instructions on appointment date, time, and address (location) are provided to you in discharge paperwork.   -Take your psychiatric medications as prescribed at discharge - instructions are provided to you in the discharge paperwork   -Follow-up with outpatient primary care doctor and other specialists -for management of chronic medical disease, including: Routine Care, Continued care for Substance Use.   -Testing: Follow-up with outpatient provider for abnormal lab results: None   -Recommend abstinence from alcohol , tobacco, and other illicit drug use at discharge.    -If your psychiatric symptoms recur, worsen, or if you have side effects to your psychiatric medications, call your outpatient psychiatric provider, 911, [phone] or go to the nearest emergency department.   -If suicidal thoughts recur, call your outpatient psychiatric provider, 911, [phone] or go to the nearest emergency department.    Signed: Marsa GORMAN Rosser, DO 09/29/2024, 1:33 PM

## 2024-09-29 NOTE — Plan of Care (Signed)

## 2024-09-29 NOTE — Group Note (Signed)
 Recreation Therapy Group Note   Group Topic:Animal Assisted Therapy   Group Date: 09/29/2024 Start Time: 0945 End Time: 1030 Facilitators: Isaac Dubie-McCall, LRT,CTRS Location: [address] Dayroom   Animal-Assisted Activity (AAA) Program Checklist/Progress Notes Patient Eligibility Criteria Checklist & Daily Group note for Rec Tx Intervention  AAA/T Program Assumption of Risk Form signed by Patient/ or Parent Legal Guardian Yes  Patient is free of allergies or severe asthma Yes  Patient reports no fear of animals Yes  Patient reports no history of cruelty to animals Yes  Patient understands his/her participation is voluntary Yes  Patient washes hands before animal contact Yes  Patient washes hands after animal contact Yes  Behavioral Response:    Education: Hand Washing, Appropriate Animal Interaction   Education Outcome: Acknowledges education.    Affect/Mood: N/A   Participation Level: Did not attend    Clinical Observations/Individualized Feedback:     Plan: Continue to engage patient in RT group sessions 2-3x/week.   Sally-Anne Wamble-McCall, LRT,CTRS 09/29/2024 12:45 PM

## 2024-09-29 NOTE — Group Note (Signed)
 Date:  09/29/2024 Time:  10:26 AM  Group Topic/Focus: Pet therapy is a therapeutic approach where trained animals--most commonly dogs, cats, horses, or even rabbits--are used to help improve the emotional, social, and mental well-being of people living with mental health conditions.    Participation Level:  Did Not Attend  Jonathan Lin 09/29/2024, 10:26 AM

## 2024-12-09 ENCOUNTER — Emergency Department (HOSPITAL_COMMUNITY): Payer: Self-pay

## 2024-12-09 ENCOUNTER — Emergency Department (HOSPITAL_COMMUNITY)
Admission: EM | Admit: 2024-12-09 | Discharge: 2024-12-09 | Disposition: A | Discharge status: Home / Self Care | Payer: Self-pay | Attending: Emergency Medicine | Admitting: Emergency Medicine

## 2024-12-09 DIAGNOSIS — R059 Cough, unspecified: Secondary | ICD-10-CM | POA: Insufficient documentation

## 2024-12-09 DIAGNOSIS — M549 Dorsalgia, unspecified: Secondary | ICD-10-CM | POA: Insufficient documentation

## 2024-12-09 DIAGNOSIS — R0600 Dyspnea, unspecified: Secondary | ICD-10-CM | POA: Insufficient documentation

## 2024-12-09 DIAGNOSIS — Z720 Tobacco use: Secondary | ICD-10-CM | POA: Insufficient documentation

## 2024-12-09 DIAGNOSIS — M25511 Pain in right shoulder: Secondary | ICD-10-CM | POA: Insufficient documentation

## 2024-12-09 LAB — COMPREHENSIVE METABOLIC PANEL WITH GFR
ALT: 57 U/L — ABNORMAL HIGH (ref 0–44)
AST: 30 U/L (ref 15–41)
Albumin: 4.7 g/dL (ref 3.5–5.0)
Alkaline Phosphatase: 63 U/L (ref 38–126)
Anion gap: 16 — ABNORMAL HIGH (ref 5–15)
BUN: 15 mg/dL (ref 6–20)
CO2: 19 mmol/L — ABNORMAL LOW (ref 22–32)
Calcium: 9.7 mg/dL (ref 8.9–10.3)
Chloride: 106 mmol/L (ref 98–111)
Creatinine, Ser: 0.9 mg/dL (ref 0.61–1.24)
GFR, Estimated: >60 mL/min
Glucose, Bld: 100 mg/dL — ABNORMAL HIGH (ref 70–99)
Potassium: 4.1 mmol/L (ref 3.5–5.1)
Sodium: 141 mmol/L (ref 135–145)
Total Bilirubin: 0.3 mg/dL (ref 0.0–1.2)
Total Protein: 7.9 g/dL (ref 6.5–8.1)

## 2024-12-09 LAB — CBC
HCT: 44.8 % (ref 39.0–52.0)
Hemoglobin: 15.4 g/dL (ref 13.0–17.0)
MCH: 31.2 pg (ref 26.0–34.0)
MCHC: 34.4 g/dL (ref 30.0–36.0)
MCV: 90.9 fL (ref 80.0–100.0)
Platelets: 217 10*3/uL (ref 150–400)
RBC: 4.93 MIL/uL (ref 4.22–5.81)
RDW: 12.8 % (ref 11.5–15.5)
WBC: 8.2 10*3/uL (ref 4.0–10.5)
nRBC: 0 % (ref 0.0–0.2)

## 2024-12-09 LAB — TROPONIN T, HIGH SENSITIVITY: Troponin T High Sensitivity: 8 ng/L (ref 0–19)

## 2024-12-09 LAB — RESP PANEL BY RT-PCR (RSV, FLU A&B, COVID)  RVPGX2
Influenza A by PCR: NEGATIVE
Influenza B by PCR: NEGATIVE
Resp Syncytial Virus by PCR: NEGATIVE
SARS Coronavirus 2 by RT PCR: NEGATIVE

## 2024-12-09 LAB — PRO BRAIN NATRIURETIC PEPTIDE: Pro Brain Natriuretic Peptide: <50 pg/mL

## 2024-12-09 MED ORDER — KETOROLAC TROMETHAMINE 15 MG/ML IJ SOLN
15.0000 mg | Freq: Once | INTRAMUSCULAR | Status: AC
  Administered 2024-12-09: 15 mg via INTRAVENOUS
  Filled 2024-12-09: qty 1

## 2024-12-09 MED ORDER — IOHEXOL 350 MG/ML SOLN
75.0000 mL | Freq: Once | INTRAVENOUS | Status: AC | PRN
  Administered 2024-12-09: 75 mL via INTRAVENOUS

## 2024-12-09 MED ORDER — ACETAMINOPHEN 500 MG PO TABS
1000.0000 mg | ORAL_TABLET | Freq: Once | ORAL | Status: AC
  Administered 2024-12-09: 1000 mg via ORAL
  Filled 2024-12-09: qty 2

## 2024-12-09 MED ORDER — METHOCARBAMOL 500 MG PO TABS: 500.0000 mg | ORAL_TABLET | Freq: Two times a day (BID) | ORAL | 0 refills | Status: AC

## 2024-12-09 MED ORDER — LIDOCAINE 5 % EX PTCH
1.0000 | MEDICATED_PATCH | CUTANEOUS | Status: DC
  Administered 2024-12-09: 1 via TRANSDERMAL
  Filled 2024-12-09: qty 1

## 2024-12-09 MED ORDER — MORPHINE SULFATE (PF) 4 MG/ML IV SOLN
4.0000 mg | Freq: Once | INTRAVENOUS | Status: AC
  Administered 2024-12-09: 4 mg via INTRAVENOUS
  Filled 2024-12-09: qty 1

## 2024-12-09 MED ORDER — METHOCARBAMOL 500 MG PO TABS
500.0000 mg | ORAL_TABLET | Freq: Once | ORAL | Status: AC
  Administered 2024-12-09: 500 mg via ORAL
  Filled 2024-12-09: qty 1

## 2024-12-09 MED ORDER — ONDANSETRON HCL 4 MG/2ML IJ SOLN
4.0000 mg | Freq: Four times a day (QID) | INTRAMUSCULAR | Status: DC | PRN
  Administered 2024-12-09: 4 mg via INTRAVENOUS
  Filled 2024-12-09: qty 2

## 2024-12-09 MED ORDER — LIDOCAINE 5 % EX PTCH: 1.0000 | MEDICATED_PATCH | CUTANEOUS | 0 refills | Status: AC

## 2024-12-09 NOTE — Discharge Instructions (Signed)
 Thank you for coming to [facility] Emergency Department. You were seen for back pain and shortness of breath. We did an exam, labs, and imaging, and these showed no acute findings. Likely you are having pain in the muscles of your back. Please take Tylenol  1000 mg every 8 hours alternated with ibuprofen  600 mg every 8 hours.  You can use massage, heat, ice, or stretching as well to help your pain.  You can take Robaxin  500 mg every 12 hours for muscle spasms.  You can also use a lidocaine  patch once per day for pain. Your liver function test today were normal. Please establish with a primary care physician - attached is a resource guide for low-cost physicians - to follow-up within 1 to 2 weeks for your medical care as well as emergency department follow-up. We spoke with the social worker who also recommended that you establish with Medicaid.  Do not hesitate to return to the ED or call 911 if you experience: -Worsening symptoms -Chest pain -Lightheadedness, passing out -Fevers/chills -Anything else that concerns you

## 2024-12-09 NOTE — ED Notes (Signed)
 Lab notified to add BNP ?

## 2024-12-09 NOTE — Progress Notes (Signed)
 ICM consulted for assistance with Medicaid. CSW attached social services resources. No further ICM needs.

## 2024-12-09 NOTE — ED Triage Notes (Signed)
 Patient reports SOB, pain with exhalation for 1 week, denies fevers/cough/chills. Patient is alert and oriented x 4. Airway patent, respirations even and unlabored. Skin normal, warm and dry.

## 2024-12-09 NOTE — ED Provider Notes (Signed)
 "  EMERGENCY DEPARTMENT AT [facility] Provider Note   CSN: [id number] Arrival date & time: 12/09/24  9171     History  No chief complaint on file.   Jonathan Lin is a 49 y.o. male with PMH as listed below who presents with SOB, pain with exhalation for 1 week, denies fevers/cough/chills. Pain right under the right shoulder blade in the back. Worsening, worse with complete exhalation. Also noticed he became SOB this morning. No h/o similar. Uses tobacco, 1 ppd, 38 years. No hemoptysis, no h/o DVT/PE. No leg swelling. No recent travel, hospitalizations, surgeries. No chest pain. No nausea/vomiting. Pain now 6/10, hasn't taken anything for pain this AM. No falls/trauma. No inciting incident for the back pain.   Patient states that he just finished a 20-day intensive alcohol  addiction program.  He states that in Nov 12, 2025his girlfriend passed away from kidney disease and the lease was up on the apartment, it has been a mess.  He states he is basically homeless right now and is staying with friends.  He is trying to get on Medicaid in order to get cancer screening and liver function tests done.    Past Medical History:  Diagnosis Date   Alcoholism (HCC)    Back pain    Bipolar 1 disorder (HCC)    Depression    Tumor cells, benign 12/24/2013   Back, with related pain and numbness.       Home Medications Prior to Admission medications  Medication Sig Start Date End Date Taking? Authorizing Provider  lidocaine  (LIDODERM ) 5 % Place 1 patch onto the skin daily. Remove & Discard patch within 12 hours or as directed by MD 12/09/24  Yes Franklyn Sid SAILOR, MD  methocarbamol  (ROBAXIN ) 500 MG tablet Take 1 tablet (500 mg total) by mouth 2 (two) times daily. 12/09/24  Yes Franklyn Sid SAILOR, MD  gabapentin  (NEURONTIN ) 300 MG capsule Take 1 capsule (300 mg total) by mouth 3 (three) times daily. 09/29/24   Pashayan, Marsa RAMAN, DO  hydrOXYzine  (ATARAX ) 25 MG tablet Take 1 tablet  (25 mg total) by mouth every 6 (six) hours as needed for anxiety (or CIWA score </= 10). 09/29/24   Pashayan, Marsa RAMAN, DO  nicotine  (NICODERM CQ  - DOSED IN MG/24 HOURS) 21 mg/24hr patch Place 1 patch (21 mg total) onto the skin daily. 09/30/24   Raliegh Marsa RAMAN, DO      Allergies    Codeine    Review of Systems   Review of Systems A 10 point review of systems was performed and is negative unless otherwise reported in HPI.  Physical Exam Updated Vital Signs BP (!) 139/106   Pulse 91   Temp 98.2 F (36.8 C) (Oral)   Resp (!) 9   SpO2 98%  Physical Exam General: Normal appearing male, lying in bed.  HEENT: PERRLA, Sclera anicteric, MMM, trachea midline.  Cardiology: RRR, no murmurs/rubs/gallops.  Resp: Normal respiratory rate and effort. CTAB, no wheezes, rhonchi, crackles.  Abd: Soft, non-tender, non-distended. No rebound tenderness or guarding.  GU: Deferred. MSK: No peripheral edema or signs of trauma. Extremities without deformity or TTP. No cyanosis or clubbing. Skin: warm, dry.  Back: No CVA tenderness.  Mild tenderness outpatient on the right paraspinal area under the shoulder blade. Neuro: A&Ox4, CNs II-XII grossly intact. MAEs. Sensation grossly intact.  Psych: Normal mood and affect.   ED Results / Procedures / Treatments   Labs (all labs ordered are listed, but only abnormal  results are displayed) Labs Reviewed  COMPREHENSIVE METABOLIC PANEL WITH GFR - Abnormal; Notable for the following components:      Result Value   CO2 19 (*)    Glucose, Bld 100 (*)    ALT 57 (*)    Anion gap 16 (*)    All other components within normal limits  RESP PANEL BY RT-PCR (RSV, FLU A&B, COVID)  RVPGX2  CBC  PRO BRAIN NATRIURETIC PEPTIDE  TROPONIN T, HIGH SENSITIVITY  TROPONIN T, HIGH SENSITIVITY    EKG EKG Interpretation Date/Time:  Wednesday December 09 2024 08:35:29 EST Ventricular Rate:  105 PR Interval:  151 QRS Duration:  104 QT Interval:  331 QTC  Calculation: 438 R Axis:   74  Text Interpretation: Sinus tachycardia Right atrial enlargement Nonspecific T abnormalities, lateral leads ST elev, probable normal early repol pattern Confirmed by Franklyn Gills [phone]) on 12/09/2024 8:42:13 AM  Radiology CT Angio Chest PE W and/or Wo Contrast Result Date: 12/09/2024 EXAM: CTA CHEST 12/09/2024 09:45:30 AM TECHNIQUE: CTA of the chest was performed after the administration of intravenous contrast. Multiplanar reformatted images are provided for review. MIP images are provided for review. Automated exposure control, iterative reconstruction, and/or weight based adjustment of the mA/kV was utilized to reduce the radiation dose to as low as reasonably achievable. COMPARISON: None available. CLINICAL HISTORY: Pulmonary embolism (PE) suspected, high prob Pulmonary embolism suspected, high probability. FINDINGS: PULMONARY ARTERIES: Pulmonary arteries are adequately opacified for evaluation. No acute pulmonary embolus. Main pulmonary artery is normal in caliber. MEDIASTINUM: The heart and pericardium demonstrate no acute abnormality. There is no acute abnormality of the thoracic aorta. LYMPH NODES: No mediastinal, hilar or axillary lymphadenopathy. LUNGS AND PLEURA: The lungs are without acute process. No focal consolidation or pulmonary edema. No evidence of pleural effusion or pneumothorax. UPPER ABDOMEN: Limited images of the upper abdomen are unremarkable. SOFT TISSUES AND BONES: No acute bone or soft tissue abnormality. IMPRESSION: 1. No pulmonary embolism or acute pulmonary abnormality. Electronically signed by: Norleen Boxer MD 12/09/2024 10:05 AM EST RP Workstation: [id number]   DG Chest Portable 1 View Result Date: 12/09/2024 CLINICAL DATA:  Short of breath, left-sided back pain EXAM: PORTABLE CHEST 1 VIEW COMPARISON:  Prior chest x-ray 04/14/2023 FINDINGS: The lungs are clear and negative for focal airspace consolidation, pulmonary edema or suspicious  pulmonary nodule. No pleural effusion or pneumothorax. Cardiac and mediastinal contours are within normal limits. No acute fracture or lytic or blastic osseous lesions. The visualized upper abdominal bowel gas pattern is unremarkable. IMPRESSION: Negative chest x-ray. Electronically Signed   By: Wilkie Lent M.D.   On: 12/09/2024 09:07    Procedures Procedures    Medications Ordered in ED Medications  ondansetron  (ZOFRAN ) injection 4 mg (4 mg Intravenous Given 12/09/24 0953)  lidocaine  (LIDODERM ) 5 % 1 patch (1 patch Transdermal Patch Applied 12/09/24 1109)  acetaminophen  (TYLENOL ) tablet 1,000 mg (1,000 mg Oral Given 12/09/24 0951)  morphine  (PF) 4 MG/ML injection 4 mg (4 mg Intravenous Given 12/09/24 0952)  iohexol  (OMNIPAQUE ) 350 MG/ML injection 75 mL (75 mLs Intravenous Contrast Given 12/09/24 0937)  methocarbamol  (ROBAXIN ) tablet 500 mg (500 mg Oral Given 12/09/24 1110)  ketorolac  (TORADOL ) 15 MG/ML injection 15 mg (15 mg Intravenous Given 12/09/24 1111)    ED Course/ Medical Decision Making/ A&P                          Medical Decision Making Amount and/or Complexity of Data Reviewed Labs:  ordered. Decision-making details documented in ED Course. Radiology: ordered. Decision-making details documented in ED Course.  Risk OTC drugs. Prescription drug management.    This patient presents to the ED for concern of back pain, dyspnea, this involves an extensive number of treatment options, and is a complaint that carries with it a high risk of complications and morbidity.  I considered the following differential and admission for this acute, potentially life threatening condition.  Very well-appearing, hemodynamically stable, no respiratory distress on exam.  No fever or chills.  MDM:    DDX for back pain/dyspnea includes but is not limited to: Consider pulmonary embolism, pleurisy, pulmonary edema/pleural effusion.  Lower concern for ACS given no chest pain, EKG without any signs  of ischemia or arrhythmia, Troponin neg.  Consider also COPD with patient's 38 pack year history, no wheezing today, doubt COPD exacerbation.  Consider heart failure with trace pitting edema in bilateral lower extremities.  Lower concern for pneumonia.  No report of trauma to indicate spinal or rib fracture. Chest x-ray clear, no PNA, pulm edema, pleural effusion. No signs of pericarditis, no pericardial effusion on CT scan. No AAS on CT scan. Patient feels improved after tylenl and morphine . Likely MSK back pain. Patient later states he did cough once hard in the car and may have strained his back. Given toradol , lidocaine  patch, and robaxin  here. Will rx robaxin  and lidocaine  patch, instructed to alternate tylenol /ibuprofen  at home. Massage, ice, stretching, heat. Advised f/u with PCP within 1-2 weeks.   For patient's housing and insurance situation, have consulted with social work to assist him however we can. Given resources, social work contact pt over the phone.    Clinical Course as of 12/09/24 1150  Wed Dec 09, 2024  0915 DG Chest Portable 1 View Negative chest x-ray. [HN]  0915 CBC wnl [HN]  1009 Resp panel by RT-PCR (RSV, Flu A&B, Covid) Anterior Nasal Swab neg [HN]  1009 Troponin T High Sensitivity: 8 neg [HN]  1009 Pro Brain Natriuretic Peptide: <50.0 neg [HN]  1009 CT Angio Chest PE W and/or Wo Contrast FINDINGS:  PULMONARY ARTERIES: Pulmonary arteries are adequately opacified for evaluation. No acute pulmonary embolus. Main pulmonary artery is normal in caliber.  MEDIASTINUM: The heart and pericardium demonstrate no acute abnormality. There is no acute abnormality of the thoracic aorta.  LYMPH NODES: No mediastinal, hilar or axillary lymphadenopathy.  LUNGS AND PLEURA: The lungs are without acute process. No focal consolidation or pulmonary edema. No evidence of pleural effusion or pneumothorax.  UPPER ABDOMEN: Limited images of the upper abdomen are  unremarkable.  SOFT TISSUES AND BONES: No acute bone or soft tissue abnormality.  IMPRESSION: 1. No pulmonary embolism or acute pulmonary abnormality.   [HN]  1117 Patient feels improved after medications. Advised to establish with PCP and f/u within 1-2 weeks. DC w/ discharge instructions/return precautions. All questions answered to patient's satisfaction.   [HN]    Clinical Course User Index [HN] Franklyn Sid SAILOR, MD    Labs: I Ordered, and personally interpreted labs.  The pertinent results include:  those listed above  Imaging Studies ordered: I ordered imaging studies including CXR, CT PE I independently visualized and interpreted imaging. I agree with the radiologist interpretation  Additional history obtained from chart review.    Cardiac Monitoring: The patient was maintained on a cardiac monitor.  I personally viewed and interpreted the cardiac monitored which showed an underlying rhythm of: ST, NSR  Reevaluation: After the interventions noted above, I  reevaluated the patient and found that they have :improved  Social Determinants of Health: Lives independently  Disposition:  DC  Co morbidities that complicate the patient evaluation  Past Medical History:  Diagnosis Date   Alcoholism (HCC)    Back pain    Bipolar 1 disorder (HCC)    Depression    Tumor cells, benign 12/24/2013   Back, with related pain and numbness.     Medicines Meds ordered this encounter  Medications   acetaminophen  (TYLENOL ) tablet 1,000 mg   morphine  (PF) 4 MG/ML injection 4 mg   ondansetron  (ZOFRAN ) injection 4 mg   iohexol  (OMNIPAQUE ) 350 MG/ML injection 75 mL   methocarbamol  (ROBAXIN ) tablet 500 mg   ketorolac  (TORADOL ) 15 MG/ML injection 15 mg   lidocaine  (LIDODERM ) 5 % 1 patch   methocarbamol  (ROBAXIN ) 500 MG tablet    Sig: Take 1 tablet (500 mg total) by mouth 2 (two) times daily.    Dispense:  20 tablet    Refill:  0   lidocaine  (LIDODERM ) 5 %    Sig: Place 1 patch  onto the skin daily. Remove & Discard patch within 12 hours or as directed by MD    Dispense:  30 patch    Refill:  0    I have reviewed the patients home medicines and have made adjustments as needed  Problem List / ED Course: Problem List Items Addressed This Visit   None Visit Diagnoses       Musculoskeletal back pain    -  Primary   Relevant Medications   acetaminophen  (TYLENOL ) tablet 1,000 mg (Completed)   morphine  (PF) 4 MG/ML injection 4 mg (Completed)   methocarbamol  (ROBAXIN ) tablet 500 mg (Completed)   ketorolac  (TORADOL ) 15 MG/ML injection 15 mg (Completed)   methocarbamol  (ROBAXIN ) 500 MG tablet     Dyspnea, unspecified type                       This note was created using dictation software, which may contain spelling or grammatical errors.    Franklyn Sid SAILOR, MD 12/09/24 1151  "
